# Patient Record
Sex: Female | Born: 1995 | Race: White | Hispanic: No | Marital: Married | Smoking: Former smoker
Health system: Southern US, Community
[De-identification: ages and names within clinical notes are randomized; demographics above are authoritative.]

## PROBLEM LIST (undated history)

## (undated) ENCOUNTER — Inpatient Hospital Stay: Payer: Self-pay

## (undated) DIAGNOSIS — F419 Anxiety disorder, unspecified: Secondary | ICD-10-CM

## (undated) DIAGNOSIS — F32A Depression, unspecified: Secondary | ICD-10-CM

## (undated) DIAGNOSIS — G4730 Sleep apnea, unspecified: Secondary | ICD-10-CM

## (undated) DIAGNOSIS — F329 Major depressive disorder, single episode, unspecified: Secondary | ICD-10-CM

## (undated) DIAGNOSIS — K219 Gastro-esophageal reflux disease without esophagitis: Secondary | ICD-10-CM

## (undated) HISTORY — PX: WISDOM TOOTH EXTRACTION: SHX21

## (undated) HISTORY — PX: FRACTURE SURGERY: SHX138

## (undated) HISTORY — DX: Anxiety disorder, unspecified: F41.9

## (undated) HISTORY — DX: Depression, unspecified: F32.A

## (undated) HISTORY — DX: Major depressive disorder, single episode, unspecified: F32.9

---

## 2014-06-01 ENCOUNTER — Ambulatory Visit: Payer: Self-pay | Admitting: Family Medicine

## 2015-03-26 NOTE — L&D Delivery Note (Signed)
Delivery Note At 7:54 AM a viable female infant delivered via Vaginal, Spontaneous Delivery (Presentation: LOA).  APGAR: 8, 9; weight 7 lb 0.2 oz (3180 g).   Placenta status: delivered, spontaneously and intact.  Cord: 3VC with the following complications: None.  Cord pH: n/a  Anesthesia:  epidural Episiotomy: None Lacerations: 2nd degree Suture Repair: 3.0 vicryl Est. Blood Loss (mL): 400  Mom to postpartum.  Baby to Couplet care / Skin to Skin.  Called to see patient.  Mom pushed to deliver a viable female infant.  The head followed by shoulders, which delivered without difficulty, and the rest of the body.  No nuchal cord noted.  Baby to mom's chest.  Cord clamped and cut after > 1 min delay.  No cord blood obtained.  Placenta delivered spontaneously, intact, with a 3-vessel cord.  Second degree perineal laceration repaired with 3-0 Vicryl in standard fashion.  All counts correct.  Hemostasis obtained with IV pitocin and fundal massage. EBL 400 mL.    [name], Natosha Bou D, MD 01/25/2016, 8:16 AM

## 2015-04-20 DIAGNOSIS — Z309 Encounter for contraceptive management, unspecified: Secondary | ICD-10-CM | POA: Diagnosis not present

## 2015-04-20 DIAGNOSIS — F419 Anxiety disorder, unspecified: Secondary | ICD-10-CM | POA: Diagnosis not present

## 2015-04-20 DIAGNOSIS — Z113 Encounter for screening for infections with a predominantly sexual mode of transmission: Secondary | ICD-10-CM | POA: Diagnosis not present

## 2015-04-20 DIAGNOSIS — F329 Major depressive disorder, single episode, unspecified: Secondary | ICD-10-CM | POA: Diagnosis not present

## 2015-06-06 DIAGNOSIS — O99211 Obesity complicating pregnancy, first trimester: Secondary | ICD-10-CM | POA: Diagnosis not present

## 2015-06-06 DIAGNOSIS — E669 Obesity, unspecified: Secondary | ICD-10-CM | POA: Diagnosis not present

## 2015-06-06 DIAGNOSIS — O0991 Supervision of high risk pregnancy, unspecified, first trimester: Secondary | ICD-10-CM | POA: Diagnosis not present

## 2015-06-06 DIAGNOSIS — Z6841 Body Mass Index (BMI) 40.0 and over, adult: Secondary | ICD-10-CM | POA: Diagnosis not present

## 2015-06-06 DIAGNOSIS — Z8619 Personal history of other infectious and parasitic diseases: Secondary | ICD-10-CM | POA: Diagnosis not present

## 2015-06-21 DIAGNOSIS — Z3A08 8 weeks gestation of pregnancy: Secondary | ICD-10-CM | POA: Diagnosis not present

## 2015-06-21 DIAGNOSIS — Z36 Encounter for antenatal screening of mother: Secondary | ICD-10-CM | POA: Diagnosis not present

## 2015-06-21 DIAGNOSIS — O99211 Obesity complicating pregnancy, first trimester: Secondary | ICD-10-CM | POA: Diagnosis not present

## 2015-06-21 DIAGNOSIS — Z6841 Body Mass Index (BMI) 40.0 and over, adult: Secondary | ICD-10-CM | POA: Diagnosis not present

## 2015-06-21 DIAGNOSIS — O0993 Supervision of high risk pregnancy, unspecified, third trimester: Secondary | ICD-10-CM | POA: Diagnosis not present

## 2015-06-21 DIAGNOSIS — E669 Obesity, unspecified: Secondary | ICD-10-CM | POA: Diagnosis not present

## 2015-06-21 DIAGNOSIS — Z8619 Personal history of other infectious and parasitic diseases: Secondary | ICD-10-CM | POA: Diagnosis not present

## 2015-06-21 DIAGNOSIS — O0991 Supervision of high risk pregnancy, unspecified, first trimester: Secondary | ICD-10-CM | POA: Diagnosis not present

## 2015-06-21 LAB — OB RESULTS CONSOLE ANTIBODY SCREEN: Antibody Screen: NEGATIVE

## 2015-06-21 LAB — OB RESULTS CONSOLE VARICELLA ZOSTER ANTIBODY, IGG: Varicella: IMMUNE

## 2015-06-21 LAB — OB RESULTS CONSOLE HIV ANTIBODY (ROUTINE TESTING): HIV: NONREACTIVE

## 2015-06-21 LAB — OB RESULTS CONSOLE ABO/RH: RH Type: POSITIVE

## 2015-06-21 LAB — OB RESULTS CONSOLE HEPATITIS B SURFACE ANTIGEN: Hepatitis B Surface Ag: NEGATIVE

## 2015-06-21 LAB — OB RESULTS CONSOLE RPR: RPR: NONREACTIVE

## 2015-06-21 LAB — OB RESULTS CONSOLE RUBELLA ANTIBODY, IGM: Rubella: IMMUNE

## 2015-06-28 DIAGNOSIS — J029 Acute pharyngitis, unspecified: Secondary | ICD-10-CM | POA: Diagnosis not present

## 2015-07-19 DIAGNOSIS — Z3A12 12 weeks gestation of pregnancy: Secondary | ICD-10-CM | POA: Diagnosis not present

## 2015-07-19 DIAGNOSIS — Z36 Encounter for antenatal screening of mother: Secondary | ICD-10-CM | POA: Diagnosis not present

## 2015-08-03 DIAGNOSIS — H52223 Regular astigmatism, bilateral: Secondary | ICD-10-CM | POA: Diagnosis not present

## 2015-08-16 DIAGNOSIS — Z3A16 16 weeks gestation of pregnancy: Secondary | ICD-10-CM | POA: Diagnosis not present

## 2015-08-16 DIAGNOSIS — O99212 Obesity complicating pregnancy, second trimester: Secondary | ICD-10-CM | POA: Diagnosis not present

## 2015-08-16 DIAGNOSIS — Z6841 Body Mass Index (BMI) 40.0 and over, adult: Secondary | ICD-10-CM | POA: Diagnosis not present

## 2015-09-06 DIAGNOSIS — Z6841 Body Mass Index (BMI) 40.0 and over, adult: Secondary | ICD-10-CM | POA: Diagnosis not present

## 2015-09-06 DIAGNOSIS — O99212 Obesity complicating pregnancy, second trimester: Secondary | ICD-10-CM | POA: Diagnosis not present

## 2015-09-06 DIAGNOSIS — Z3A19 19 weeks gestation of pregnancy: Secondary | ICD-10-CM | POA: Diagnosis not present

## 2015-09-06 DIAGNOSIS — Z36 Encounter for antenatal screening of mother: Secondary | ICD-10-CM | POA: Diagnosis not present

## 2015-11-01 DIAGNOSIS — Z131 Encounter for screening for diabetes mellitus: Secondary | ICD-10-CM | POA: Diagnosis not present

## 2015-11-01 DIAGNOSIS — Z3A27 27 weeks gestation of pregnancy: Secondary | ICD-10-CM | POA: Diagnosis not present

## 2015-11-01 DIAGNOSIS — Z6841 Body Mass Index (BMI) 40.0 and over, adult: Secondary | ICD-10-CM | POA: Diagnosis not present

## 2015-11-01 DIAGNOSIS — O0992 Supervision of high risk pregnancy, unspecified, second trimester: Secondary | ICD-10-CM | POA: Diagnosis not present

## 2015-11-01 DIAGNOSIS — O99212 Obesity complicating pregnancy, second trimester: Secondary | ICD-10-CM | POA: Diagnosis not present

## 2015-11-22 DIAGNOSIS — R7302 Impaired glucose tolerance (oral): Secondary | ICD-10-CM | POA: Diagnosis not present

## 2015-11-22 DIAGNOSIS — Z6841 Body Mass Index (BMI) 40.0 and over, adult: Secondary | ICD-10-CM | POA: Diagnosis not present

## 2015-11-22 DIAGNOSIS — Z131 Encounter for screening for diabetes mellitus: Secondary | ICD-10-CM | POA: Diagnosis not present

## 2015-11-22 DIAGNOSIS — O0992 Supervision of high risk pregnancy, unspecified, second trimester: Secondary | ICD-10-CM | POA: Diagnosis not present

## 2015-11-22 DIAGNOSIS — Z3A27 27 weeks gestation of pregnancy: Secondary | ICD-10-CM | POA: Diagnosis not present

## 2015-11-22 DIAGNOSIS — O99212 Obesity complicating pregnancy, second trimester: Secondary | ICD-10-CM | POA: Diagnosis not present

## 2015-11-26 ENCOUNTER — Encounter: Payer: Self-pay | Admitting: Emergency Medicine

## 2015-11-26 DIAGNOSIS — O26892 Other specified pregnancy related conditions, second trimester: Secondary | ICD-10-CM | POA: Diagnosis not present

## 2015-11-26 DIAGNOSIS — Z3A28 28 weeks gestation of pregnancy: Secondary | ICD-10-CM | POA: Insufficient documentation

## 2015-11-26 DIAGNOSIS — O9989 Other specified diseases and conditions complicating pregnancy, childbirth and the puerperium: Secondary | ICD-10-CM | POA: Diagnosis not present

## 2015-11-26 DIAGNOSIS — R0781 Pleurodynia: Secondary | ICD-10-CM | POA: Diagnosis not present

## 2015-11-26 DIAGNOSIS — R079 Chest pain, unspecified: Secondary | ICD-10-CM | POA: Diagnosis not present

## 2015-11-26 DIAGNOSIS — Z3A01 Less than 8 weeks gestation of pregnancy: Secondary | ICD-10-CM | POA: Diagnosis not present

## 2015-11-26 DIAGNOSIS — Z87891 Personal history of nicotine dependence: Secondary | ICD-10-CM | POA: Diagnosis not present

## 2015-11-26 DIAGNOSIS — R0602 Shortness of breath: Secondary | ICD-10-CM | POA: Diagnosis present

## 2015-11-26 LAB — COMPREHENSIVE METABOLIC PANEL
ALT: 11 U/L — ABNORMAL LOW (ref 14–54)
AST: 19 U/L (ref 15–41)
Albumin: 3.3 g/dL — ABNORMAL LOW (ref 3.5–5.0)
Alkaline Phosphatase: 72 U/L (ref 38–126)
Anion gap: 10 (ref 5–15)
BUN: 6 mg/dL (ref 6–20)
CO2: 17 mmol/L — ABNORMAL LOW (ref 22–32)
Calcium: 8.6 mg/dL — ABNORMAL LOW (ref 8.9–10.3)
Chloride: 106 mmol/L (ref 101–111)
Creatinine, Ser: 0.37 mg/dL — ABNORMAL LOW (ref 0.44–1.00)
GFR calc Af Amer: >60 mL/min (ref >60)
GFR calc non Af Amer: >60 mL/min (ref >60)
Glucose, Bld: 79 mg/dL (ref 65–99)
Potassium: 3.7 mmol/L (ref 3.5–5.1)
Sodium: 133 mmol/L — ABNORMAL LOW (ref 135–145)
Total Bilirubin: 0.2 mg/dL — ABNORMAL LOW (ref 0.3–1.2)
Total Protein: 7 g/dL (ref 6.5–8.1)

## 2015-11-26 LAB — CBC WITH DIFFERENTIAL/PLATELET
Basophils Absolute: 0 10*3/uL (ref 0–0.1)
Basophils Relative: 0 %
Eosinophils Absolute: 0.1 10*3/uL (ref 0–0.7)
Eosinophils Relative: 1 %
HCT: 35.1 % (ref 35.0–47.0)
Hemoglobin: 12 g/dL (ref 12.0–16.0)
Lymphocytes Relative: 17 %
Lymphs Abs: 2.7 10*3/uL (ref 1.0–3.6)
MCH: 29.9 pg (ref 26.0–34.0)
MCHC: 34.3 g/dL (ref 32.0–36.0)
MCV: 87.2 fL (ref 80.0–100.0)
Monocytes Absolute: 1.3 10*3/uL — ABNORMAL HIGH (ref 0.2–0.9)
Monocytes Relative: 8 %
Neutro Abs: 11.7 10*3/uL — ABNORMAL HIGH (ref 1.4–6.5)
Neutrophils Relative %: 74 %
Platelets: 316 10*3/uL (ref 150–440)
RBC: 4.02 MIL/uL (ref 3.80–5.20)
RDW: 12.6 % (ref 11.5–14.5)
WBC: 15.7 10*3/uL — ABNORMAL HIGH (ref 3.6–11.0)

## 2015-11-26 LAB — FIBRIN DERIVATIVES D-DIMER (ARMC ONLY): Fibrin derivatives D-dimer (ARMC): 969 — ABNORMAL HIGH (ref 0–499)

## 2015-11-26 LAB — TROPONIN I: Troponin I: <0.03 ng/mL (ref <0.03)

## 2015-11-26 NOTE — ED Notes (Signed)
Pt c/o right upper quadrant pain, sharp with a deep breath; pt is 7 months pregnant; ambulatory with steady gait; talking in complete coherent sentences

## 2015-11-26 NOTE — ED Triage Notes (Signed)
Pt presents to ED with "right sided rib cage pain" that worsens when taking a deep breath in. Pt states she called her mom who encouraged her to come get checked out to ensure she hasn't developed a blood clot. Pain started approx 3 hours ago while she was lying down. No hx of similar pain. Denies pregnancy complications. Denies n/v/d. Alert and calm at this time with no increased work of breathing or acute distress noted.

## 2015-11-27 ENCOUNTER — Emergency Department
Admission: EM | Admit: 2015-11-27 | Discharge: 2015-11-27 | Disposition: A | Discharge status: Home / Self Care | Payer: 59 | Attending: Emergency Medicine | Admitting: Emergency Medicine

## 2015-11-27 ENCOUNTER — Emergency Department: Payer: 59

## 2015-11-27 DIAGNOSIS — R079 Chest pain, unspecified: Secondary | ICD-10-CM

## 2015-11-27 DIAGNOSIS — Z3A28 28 weeks gestation of pregnancy: Secondary | ICD-10-CM | POA: Diagnosis not present

## 2015-11-27 DIAGNOSIS — R072 Precordial pain: Secondary | ICD-10-CM | POA: Diagnosis not present

## 2015-11-27 DIAGNOSIS — Z87891 Personal history of nicotine dependence: Secondary | ICD-10-CM | POA: Diagnosis not present

## 2015-11-27 DIAGNOSIS — O26892 Other specified pregnancy related conditions, second trimester: Secondary | ICD-10-CM | POA: Diagnosis not present

## 2015-11-27 MED ORDER — ACETAMINOPHEN 325 MG PO TABS: 650.0000 mg | ORAL_TABLET | Freq: Once | ORAL | Status: DC

## 2015-11-27 MED ORDER — IOPAMIDOL (ISOVUE-370) INJECTION 76%
100.0000 mL | Freq: Once | INTRAVENOUS | Status: AC | PRN
  Administered 2015-11-27: 100 mL via INTRAVENOUS

## 2015-11-27 NOTE — ED Provider Notes (Signed)
[facility] Emergency Department Provider Note   ____________________________________________   First MD Initiated Contact with Patient 11/27/15 [phone]     (approximate)  I have reviewed the triage vital signs and the nursing notes.   HISTORY  Chief Complaint Abdominal Pain    HPI Brittney Gill is a 20 y.o. female who comes into the hospital today with pain in her rib cage. She reports it started between 6:30 and 7 PM. The patient is 7 months pregnant and feels like she can't take a deep breath. She reports that she's not struggling for air when she feels mildly short of breath. She reports that the pain is a 4-5 out of 10 in intensity but it worse when she takes a deep breath in. The patient called her mother who is a Engineer, civil (consulting) and was told to come into the hospital to make sure she doesn't have a clot in her lung. She reports that she's never had it before. She has a mild headache with no dizziness and no passing out. The patient reports that the pain doesn't radiate and she's had no sweats, nausea or vomiting. She had some abdominal pain earlier in the weekend but reports that that has resolved. The patient denies any vaginal bleeding. She is here for evaluation.The patient also endorses some pain in her calf when she flexes her foot.   History reviewed. No pertinent past medical history.  There are no active problems to display for this patient.   Past Surgical History:  Procedure Laterality Date  . WISDOM TOOTH EXTRACTION      Prior to Admission medications   Not on File    Allergies Diflucan [fluconazole]  No family history on file.  Social History Social History  Substance Use Topics  . Smoking status: Former Smoker    Types: Cigarettes    Quit date: 04/26/2015  . Smokeless tobacco: Not on file  . Alcohol use No    Review of Systems Constitutional: No fever/chills Eyes: No visual changes. ENT: No sore throat. Cardiovascular:  chest  pain. Respiratory:  shortness of breath. Gastrointestinal:  abdominal pain.  No nausea, no vomiting.  No diarrhea.  No constipation. Genitourinary: Negative for dysuria. Musculoskeletal: Negative for back pain. Skin: Negative for rash. Neurological: Headache  10-point ROS otherwise negative.  ____________________________________________   PHYSICAL EXAM:  VITAL SIGNS: ED Triage Vitals  Enc Vitals Group     BP 11/26/15 2102 (!) 147/78     Pulse Rate 11/26/15 2102 94     Resp 11/26/15 2102 18     Temp 11/26/15 2102 98.4 F (36.9 C)     Temp Source 11/26/15 2102 Oral     SpO2 11/26/15 2102 100 %     Weight 11/26/15 2102 250 lb (113.4 kg)     Height 11/26/15 2102 5\' 6"  (1.676 m)     Head Circumference --      Peak Flow --      Pain Score 11/26/15 2105 4     Pain Loc --      Pain Edu? --      Excl. in GC? --     Constitutional: Alert and oriented. Well appearing and in Mild distress. Eyes: Conjunctivae are normal. PERRL. EOMI. Head: Atraumatic. Nose: No congestion/rhinnorhea. Mouth/Throat: Mucous membranes are moist.  Oropharynx non-erythematous. Cardiovascular: Normal rate, regular rhythm. Grossly normal heart sounds.  Good peripheral circulation. Respiratory: Normal respiratory effort.  No retractions. Lungs CTAB. Gastrointestinal: Soft and nontender. No distention. Positive  bowel sounds Musculoskeletal: No lower extremity tenderness nor edema.   Neurologic:  Normal speech and language.  Skin:  Skin is warm, dry and intact.  Psychiatric: Mood and affect are normal.   ____________________________________________   LABS (all labs ordered are listed, but only abnormal results are displayed)  Labs Reviewed  COMPREHENSIVE METABOLIC PANEL - Abnormal; Notable for the following:       Result Value   Sodium 133 (*)    CO2 17 (*)    Creatinine, Ser 0.37 (*)    Calcium 8.6 (*)    Albumin 3.3 (*)    ALT 11 (*)    Total Bilirubin 0.2 (*)    All other components within  normal limits  CBC WITH DIFFERENTIAL/PLATELET - Abnormal; Notable for the following:    WBC 15.7 (*)    Neutro Abs 11.7 (*)    Monocytes Absolute 1.3 (*)    All other components within normal limits  FIBRIN DERIVATIVES D-DIMER ([facility] ONLY) - Abnormal; Notable for the following:    Fibrin derivatives D-dimer ([facility]) 969 (*)    All other components within normal limits  TROPONIN I   ____________________________________________  EKG  ED ECG REPORT I, Rebecka ApleyWebster,  Keenya Matera P, the attending physician, personally viewed and interpreted this ECG.   Date: 11/27/2015  EKG Time: 203  Rate: 85  Rhythm: normal sinus rhythm  Axis: normal  Intervals:none  ST&T Change: none  ____________________________________________  RADIOLOGY  CT angio chest ____________________________________________   PROCEDURES  Procedure(s) performed: None  Procedures  Critical Care performed: No  ____________________________________________   INITIAL IMPRESSION / ASSESSMENT AND PLAN / ED COURSE  Pertinent labs & imaging results that were available during my care of the patient were reviewed by me and considered in my medical decision making (see chart for details).  This is a 20 year old female who comes into the hospital today with some pain with deep inspiration to her right chest. The patient is 7 months pregnant. I did discuss the CT scan with the patient as a way to discover if she has clot in her chest. I discussed the risks of radiation to the patient and she reports that she understands and is willing to take the risk. The patient receive a CTA of her chest for evaluation of a clot.  Clinical Course  Value Comment By Time  CT Angio Chest PE W and/or Wo Contrast No acute intrathoracic pathology identified. No definite CT evidence of central pulmonary artery embolus   Rebecka ApleyAllison P Thaddius Manes, MD 09/04 0246   The patient's CT scan is unremarkable. I did give the patient a dose of Tylenol. The patient's  blood work shows an elevated d-dimer as well as a white count of 15 which can be elevated due to the pregnancy and a bicarbonate of 17. She will be encouraged to increase her oral intake. She is in no acute distress at this time. She will be discharged home to follow-up with her OB/GYN.  ____________________________________________   FINAL CLINICAL IMPRESSION(S) / ED DIAGNOSES  Final diagnoses:  Chest pain, unspecified chest pain type      NEW MEDICATIONS STARTED DURING THIS VISIT:  New Prescriptions   No medications on file     Note:  This document was prepared using Dragon voice recognition software and may include unintentional dictation errors.    Rebecka ApleyAllison P Rasean Joos, MD 11/27/15 [phone]

## 2015-11-29 DIAGNOSIS — Z3A31 31 weeks gestation of pregnancy: Secondary | ICD-10-CM | POA: Diagnosis not present

## 2015-11-29 DIAGNOSIS — Z23 Encounter for immunization: Secondary | ICD-10-CM | POA: Diagnosis not present

## 2015-12-15 DIAGNOSIS — J029 Acute pharyngitis, unspecified: Secondary | ICD-10-CM | POA: Diagnosis not present

## 2016-01-03 DIAGNOSIS — O0993 Supervision of high risk pregnancy, unspecified, third trimester: Secondary | ICD-10-CM | POA: Diagnosis not present

## 2016-01-03 DIAGNOSIS — Z6841 Body Mass Index (BMI) 40.0 and over, adult: Secondary | ICD-10-CM | POA: Diagnosis not present

## 2016-01-03 DIAGNOSIS — Z113 Encounter for screening for infections with a predominantly sexual mode of transmission: Secondary | ICD-10-CM | POA: Diagnosis not present

## 2016-01-03 DIAGNOSIS — Z3A36 36 weeks gestation of pregnancy: Secondary | ICD-10-CM | POA: Diagnosis not present

## 2016-01-03 LAB — OB RESULTS CONSOLE GBS: GBS: NEGATIVE

## 2016-01-10 DIAGNOSIS — Z369 Encounter for antenatal screening, unspecified: Secondary | ICD-10-CM | POA: Diagnosis not present

## 2016-01-10 DIAGNOSIS — Z3A37 37 weeks gestation of pregnancy: Secondary | ICD-10-CM | POA: Diagnosis not present

## 2016-01-10 DIAGNOSIS — Z6841 Body Mass Index (BMI) 40.0 and over, adult: Secondary | ICD-10-CM | POA: Diagnosis not present

## 2016-01-10 DIAGNOSIS — O99213 Obesity complicating pregnancy, third trimester: Secondary | ICD-10-CM | POA: Diagnosis not present

## 2016-01-10 DIAGNOSIS — E669 Obesity, unspecified: Secondary | ICD-10-CM | POA: Diagnosis not present

## 2016-01-17 DIAGNOSIS — Z6841 Body Mass Index (BMI) 40.0 and over, adult: Secondary | ICD-10-CM | POA: Diagnosis not present

## 2016-01-17 DIAGNOSIS — Z369 Encounter for antenatal screening, unspecified: Secondary | ICD-10-CM | POA: Diagnosis not present

## 2016-01-17 DIAGNOSIS — O99213 Obesity complicating pregnancy, third trimester: Secondary | ICD-10-CM | POA: Diagnosis not present

## 2016-01-17 DIAGNOSIS — E669 Obesity, unspecified: Secondary | ICD-10-CM | POA: Diagnosis not present

## 2016-01-17 DIAGNOSIS — Z3A38 38 weeks gestation of pregnancy: Secondary | ICD-10-CM | POA: Diagnosis not present

## 2016-01-22 ENCOUNTER — Observation Stay
Admission: EM | Admit: 2016-01-22 | Discharge: 2016-01-22 | Disposition: A | Discharge status: Home / Self Care | Payer: 59 | Source: Home / Self Care | Admitting: Obstetrics and Gynecology

## 2016-01-22 DIAGNOSIS — Z888 Allergy status to other drugs, medicaments and biological substances status: Secondary | ICD-10-CM | POA: Insufficient documentation

## 2016-01-22 DIAGNOSIS — O133 Gestational [pregnancy-induced] hypertension without significant proteinuria, third trimester: Secondary | ICD-10-CM | POA: Insufficient documentation

## 2016-01-22 DIAGNOSIS — Z87891 Personal history of nicotine dependence: Secondary | ICD-10-CM

## 2016-01-22 DIAGNOSIS — R030 Elevated blood-pressure reading, without diagnosis of hypertension: Secondary | ICD-10-CM | POA: Diagnosis not present

## 2016-01-22 DIAGNOSIS — Z3A38 38 weeks gestation of pregnancy: Secondary | ICD-10-CM

## 2016-01-22 LAB — COMPREHENSIVE METABOLIC PANEL
ALT: 13 U/L — ABNORMAL LOW (ref 14–54)
AST: 21 U/L (ref 15–41)
Albumin: 3.3 g/dL — ABNORMAL LOW (ref 3.5–5.0)
Alkaline Phosphatase: 102 U/L (ref 38–126)
Anion gap: 10 (ref 5–15)
BUN: 9 mg/dL (ref 6–20)
CO2: 20 mmol/L — ABNORMAL LOW (ref 22–32)
Calcium: 8.9 mg/dL (ref 8.9–10.3)
Chloride: 102 mmol/L (ref 101–111)
Creatinine, Ser: 0.55 mg/dL (ref 0.44–1.00)
GFR calc Af Amer: >60 mL/min (ref >60)
GFR calc non Af Amer: >60 mL/min (ref >60)
Glucose, Bld: 74 mg/dL (ref 65–99)
Potassium: 3.6 mmol/L (ref 3.5–5.1)
Sodium: 132 mmol/L — ABNORMAL LOW (ref 135–145)
Total Bilirubin: <0.1 mg/dL — ABNORMAL LOW (ref 0.3–1.2)
Total Protein: 7 g/dL (ref 6.5–8.1)

## 2016-01-22 LAB — CBC
HCT: 34.8 % — ABNORMAL LOW (ref 35.0–47.0)
Hemoglobin: 11.7 g/dL — ABNORMAL LOW (ref 12.0–16.0)
MCH: 28.9 pg (ref 26.0–34.0)
MCHC: 33.7 g/dL (ref 32.0–36.0)
MCV: 86 fL (ref 80.0–100.0)
Platelets: 265 10*3/uL (ref 150–440)
RBC: 4.05 MIL/uL (ref 3.80–5.20)
RDW: 13.3 % (ref 11.5–14.5)
WBC: 12.5 10*3/uL — ABNORMAL HIGH (ref 3.6–11.0)

## 2016-01-22 LAB — PROTEIN / CREATININE RATIO, URINE
Creatinine, Urine: 195 mg/dL
Protein Creatinine Ratio: 0.19 mg/mg{Cre} — ABNORMAL HIGH (ref 0.00–0.15)
Total Protein, Urine: 38 mg/dL

## 2016-01-22 NOTE — OB Triage Note (Signed)
Ms. Brittney JeffersonGood here with c/o LOF at approx 1500, unsure of amt and color. States did not smell like urine. Denies bleeding, ctx, reports + FM

## 2016-01-22 NOTE — Discharge Instructions (Signed)
Come back if:  Big gush of fluids Temp over 100.4 Decreased fetal movement Heavy vaginal bleeding Contractions every 3-5 min apart for at least one hour  Get plenty of rest and stay well hydrated!

## 2016-01-22 NOTE — Discharge Summary (Signed)
Physician Final Progress Note  Patient ID: Brittney Gill MRN: [id number] DOB/AGE: 21-13-1997 21 y.o.  Admit date: 01/22/2016 Admitting provider: Rod Gill, CNM Discharge date: 01/22/2016   Admission Diagnoses: G1P0 at 54w5dwith c/o LOF earlier today. She admits positive fetal movement. She denies contractions or vaginal bleeding.   Discharge Diagnoses:  Active Problems:   Labor and delivery indication for care or intervention   Gestational hypertension without significant proteinuria during pregnancy in third trimester, antepartum  IUP with reactive NST, membranes intact  Hospital course: Pt was admitted for observation, placed on monitors, labs done.  No past medical history on file.  Past Surgical History:  Procedure Laterality Date  . WISDOM TOOTH EXTRACTION      No current facility-administered medications on file prior to encounter.    No current outpatient prescriptions on file prior to encounter.    Allergies  Allergen Reactions  . Diflucan [Fluconazole] Rash    Social History   Social History  . Marital status: Single    Spouse name: N/A  . Number of children: N/A  . Years of education: N/A   Occupational History  . Not on file.   Social History Main Topics  . Smoking status: Former Smoker    Types: Cigarettes    Quit date: 04/26/2015  . Smokeless tobacco: Not on file  . Alcohol use No  . Drug use: No  . Sexual activity: Not on file   Other Topics Concern  . Not on file   Social History Narrative  . No narrative on file    Physical Exam: BP (!) 146/84 (BP Location: Left Arm)   Pulse 75   Temp 98.9 F (37.2 C) (Oral)   Resp 16   Gen: NAD CV: RRR Pulm: CTAB Pelvic: deferred Ext: no evidence of DVT Toco: negative Fetal Well Being: 135 bpm, moderate variability, positive accelerations, negative decelerations ROM assessment: Nitrazine negative  Consults: None  Significant Findings/ Diagnostic Studies: labs:   Results for Brittney Gill(MRN [id number] as of 01/22/2016 20:19  Ref. Range 01/22/2016 18:00 01/22/2016 18:17  Sodium Latest Ref Range: 135 - 145 mmol/L  132 (L)  Potassium Latest Ref Range: 3.5 - 5.1 mmol/L  3.6  Chloride Latest Ref Range: 101 - 111 mmol/L  102  CO2 Latest Ref Range: 22 - 32 mmol/L  20 (L)  BUN Latest Ref Range: 6 - 20 mg/dL  9  Creatinine Latest Ref Range: 0.44 - 1.00 mg/dL  0.55  Calcium Latest Ref Range: 8.9 - 10.3 mg/dL  8.9  EGFR (Non-African Amer.) Latest Ref Range: >60 mL/min  >60  EGFR (African American) Latest Ref Range: >60 mL/min  >60  Glucose Latest Ref Range: 65 - 99 mg/dL  74  Anion gap Latest Ref Range: 5 - 15   10  Alkaline Phosphatase Latest Ref Range: 38 - 126 U/L  102  Albumin Latest Ref Range: 3.5 - 5.0 g/dL  3.3 (L)  AST Latest Ref Range: 15 - 41 U/L  21  ALT Latest Ref Range: 14 - 54 U/L  13 (L)  Total Protein Latest Ref Range: 6.5 - 8.1 g/dL  7.0  Total Bilirubin Latest Ref Range: 0.3 - 1.2 mg/dL  <0.1 (L)  WBC Latest Ref Range: 3.6 - 11.0 K/uL  12.5 (H)  RBC Latest Ref Range: 3.80 - 5.20 MIL/uL  4.05  Hemoglobin Latest Ref Range: 12.0 - 16.0 g/dL  11.7 (L)  HCT Latest Ref Range: 35.0 - 47.0 %  34.8 (  L)  MCV Latest Ref Range: 80.0 - 100.0 fL  86.0  MCH Latest Ref Range: 26.0 - 34.0 pg  28.9  MCHC Latest Ref Range: 32.0 - 36.0 g/dL  33.7  RDW Latest Ref Range: 11.5 - 14.5 %  13.3  Platelets Latest Ref Range: 150 - 440 K/uL  265  Total Protein, Urine Latest Units: mg/dL 38   Protein Creatinine Ratio Latest Ref Range: 0.00 - 0.15 mg/mgCre 0.19 (H)   Creatinine, Urine Latest Units: mg/dL 195     Procedures: NST  Discharge Condition: good  Disposition: 01-Home or Self Care  Diet: Regular diet  Discharge Activity: Activity as tolerated  Discussed plan of care with patient- given her elevated blood pressure and being greater than [redacted] weeks gestation, a recommendation is made to stay at hospital to begin induction. She is also given the choice of going home  tonight and returning to clinic tomorrow for a blood pressure check with the understanding that if her pressure is still elevated, the recommendation will be to induce at that time. The patient, along with her mother, has decided to go home tonight and return for BP check in clinic tomorrow.   Discharge Instructions    Discharge activity:  No Restrictions    Complete by:  As directed    Discharge diet:  No restrictions    Complete by:  As directed    Fetal Kick Count:  Lie on our left side for one hour after a meal, and count the number of times your baby kicks.  If it is less than 5 times, get up, move around and drink some juice.  Repeat the test 30 minutes later.  If it is still less than 5 kicks in an hour, notify your doctor.    Complete by:  As directed    LABOR:  When conractions begin, you should start to time them from the beginning of one contraction to the beginning  of the next.  When contractions are 5 - 10 minutes apart or less and have been regular for at least an hour, you should call your health care provider.    Complete by:  As directed    No sexual activity restrictions    Complete by:  As directed    Notify physician for bleeding from the vagina    Complete by:  As directed    Notify physician for blurring of vision or spots before the eyes    Complete by:  As directed    Notify physician for chills or fever    Complete by:  As directed    Notify physician for fainting spells, "black outs" or loss of consciousness    Complete by:  As directed    Notify physician for increase in vaginal discharge    Complete by:  As directed    Notify physician for leaking of fluid    Complete by:  As directed    Notify physician for pain or burning when urinating    Complete by:  As directed    Notify physician for pelvic pressure (sudden increase)    Complete by:  As directed    Notify physician for severe or continued nausea or vomiting    Complete by:  As directed    Notify  physician for sudden gushing of fluid from the vagina (with or without continued leaking)    Complete by:  As directed    Notify physician for sudden, constant, or occasional abdominal pain  Complete by:  As directed    Notify physician if baby moving less than usual    Complete by:  As directed        Medication List    You have not been prescribed any medications.    Follow-up Information    [facility], [place] .   Why:  go to regular scheduled prenatal appointment/BP check Contact information: [address] [place] [address] [phone]      Total time spent taking care of this patient: 30 minutes  Signed: Rod Gill, CNM  01/22/2016, 8:13 PM

## 2016-01-23 DIAGNOSIS — R030 Elevated blood-pressure reading, without diagnosis of hypertension: Secondary | ICD-10-CM | POA: Diagnosis not present

## 2016-01-24 ENCOUNTER — Encounter: Payer: Self-pay | Admitting: *Deleted

## 2016-01-24 ENCOUNTER — Inpatient Hospital Stay
Admission: RE | Admit: 2016-01-24 | Discharge: 2016-01-26 | DRG: 775 | Disposition: A | Discharge status: Home / Self Care | Payer: 59 | Attending: Obstetrics and Gynecology | Admitting: Obstetrics and Gynecology

## 2016-01-24 DIAGNOSIS — Z87891 Personal history of nicotine dependence: Secondary | ICD-10-CM | POA: Diagnosis not present

## 2016-01-24 DIAGNOSIS — O134 Gestational [pregnancy-induced] hypertension without significant proteinuria, complicating childbirth: Secondary | ICD-10-CM | POA: Diagnosis present

## 2016-01-24 DIAGNOSIS — O99213 Obesity complicating pregnancy, third trimester: Secondary | ICD-10-CM | POA: Diagnosis not present

## 2016-01-24 DIAGNOSIS — K219 Gastro-esophageal reflux disease without esophagitis: Secondary | ICD-10-CM | POA: Diagnosis present

## 2016-01-24 DIAGNOSIS — O99214 Obesity complicating childbirth: Secondary | ICD-10-CM | POA: Diagnosis present

## 2016-01-24 DIAGNOSIS — Z3A39 39 weeks gestation of pregnancy: Secondary | ICD-10-CM | POA: Diagnosis not present

## 2016-01-24 DIAGNOSIS — O9962 Diseases of the digestive system complicating childbirth: Secondary | ICD-10-CM | POA: Diagnosis present

## 2016-01-24 DIAGNOSIS — Z362 Encounter for other antenatal screening follow-up: Secondary | ICD-10-CM | POA: Diagnosis not present

## 2016-01-24 DIAGNOSIS — E669 Obesity, unspecified: Secondary | ICD-10-CM | POA: Diagnosis not present

## 2016-01-24 DIAGNOSIS — Z6841 Body Mass Index (BMI) 40.0 and over, adult: Secondary | ICD-10-CM

## 2016-01-24 DIAGNOSIS — O4103X0 Oligohydramnios, third trimester, not applicable or unspecified: Secondary | ICD-10-CM | POA: Diagnosis not present

## 2016-01-24 LAB — CHLAMYDIA/NGC RT PCR (ARMC ONLY)
Chlamydia Tr: NOT DETECTED
N gonorrhoeae: NOT DETECTED

## 2016-01-24 LAB — CBC
HCT: 36.4 % (ref 35.0–47.0)
Hemoglobin: 11.9 g/dL — ABNORMAL LOW (ref 12.0–16.0)
MCH: 28.3 pg (ref 26.0–34.0)
MCHC: 32.8 g/dL (ref 32.0–36.0)
MCV: 86.4 fL (ref 80.0–100.0)
Platelets: 263 10*3/uL (ref 150–440)
RBC: 4.22 MIL/uL (ref 3.80–5.20)
RDW: 13.4 % (ref 11.5–14.5)
WBC: 12.4 10*3/uL — ABNORMAL HIGH (ref 3.6–11.0)

## 2016-01-24 LAB — TYPE AND SCREEN
ABO/RH(D): B POS
Antibody Screen: NEGATIVE

## 2016-01-24 MED ORDER — ACETAMINOPHEN 325 MG PO TABS
650.0000 mg | ORAL_TABLET | ORAL | Status: DC | PRN
  Filled 2016-01-24: qty 2

## 2016-01-24 MED ORDER — LACTATED RINGERS IV SOLN: 500.0000 mL | INTRAVENOUS | Status: DC | PRN

## 2016-01-24 MED ORDER — FENTANYL 2.5 MCG/ML W/ROPIVACAINE 0.2% IN NS 100 ML EPIDURAL INFUSION (ARMC-ANES)
EPIDURAL | Status: AC
  Filled 2016-01-24: qty 100

## 2016-01-24 MED ORDER — BUTORPHANOL TARTRATE 1 MG/ML IJ SOLN
1.0000 mg | INTRAMUSCULAR | Status: DC | PRN
  Administered 2016-01-24: 1 mg via INTRAVENOUS
  Filled 2016-01-24: qty 1

## 2016-01-24 MED ORDER — OXYTOCIN 10 UNIT/ML IJ SOLN
INTRAMUSCULAR | Status: AC
  Filled 2016-01-24: qty 2

## 2016-01-24 MED ORDER — LIDOCAINE HCL (PF) 1 % IJ SOLN
INTRAMUSCULAR | Status: AC
  Filled 2016-01-24: qty 30

## 2016-01-24 MED ORDER — OXYTOCIN 40 UNITS IN LACTATED RINGERS INFUSION - SIMPLE MED
1.0000 m[IU]/min | INTRAVENOUS | Status: DC
  Administered 2016-01-24: 1 m[IU]/min via INTRAVENOUS

## 2016-01-24 MED ORDER — OXYTOCIN 40 UNITS IN LACTATED RINGERS INFUSION - SIMPLE MED
2.5000 [IU]/h | INTRAVENOUS | Status: DC
  Administered 2016-01-25: 2.5 [IU]/h via INTRAVENOUS

## 2016-01-24 MED ORDER — OXYTOCIN BOLUS FROM INFUSION
500.0000 mL | Freq: Once | INTRAVENOUS | Status: AC
  Administered 2016-01-25: 500 mL via INTRAVENOUS

## 2016-01-24 MED ORDER — MISOPROSTOL 200 MCG PO TABS
ORAL_TABLET | ORAL | Status: AC
  Filled 2016-01-24: qty 4

## 2016-01-24 MED ORDER — OXYTOCIN 40 UNITS IN LACTATED RINGERS INFUSION - SIMPLE MED
INTRAVENOUS | Status: AC
  Administered 2016-01-24: 1 m[IU]/min via INTRAVENOUS
  Filled 2016-01-24: qty 1000

## 2016-01-24 MED ORDER — TERBUTALINE SULFATE 1 MG/ML IJ SOLN
0.2500 mg | Freq: Once | INTRAMUSCULAR | Status: DC | PRN
  Filled 2016-01-24: qty 1

## 2016-01-24 MED ORDER — LACTATED RINGERS IV SOLN
INTRAVENOUS | Status: DC
  Administered 2016-01-24: 12:00:00 via INTRAVENOUS

## 2016-01-24 MED ORDER — AMMONIA AROMATIC IN INHA
RESPIRATORY_TRACT | Status: AC
  Filled 2016-01-24: qty 10

## 2016-01-24 NOTE — H&P (Signed)
Obstetric History and Physical  Brittney Gill is a 20 y.o. G1P0 with Estimated Date of Delivery: 01/31/16 per 8 wk US who presents at 2748w0d  presenting for IOL for Oligohydramnios. AFI in office today of 2 cm.  Patient states she has been having no contractions, no vaginal bleeding, intact membranes, with active fetal movement.    Prenatal Course Source of Care: [facility]  with onset of care at 6 weeks Pregnancy complications or risks: -Obesity, BMI > 40 - most recent growth at 55% -Elevated 1 hr at 28 wks with normal 3 hour -Enlarged fetal stomach on US - will notify Peds  Patient Active Problem List   Diagnosis Date Noted  . Labor and delivery indication for care or intervention 01/22/2016  . Gestational hypertension without significant proteinuria during pregnancy in third trimester, antepartum 01/22/2016   She plans to breastfeed She desires IUD possibly for postpartum contraception.   Prenatal labs and studies: ABO, Rh: B+  Antibody: Neg Rubella: Immune Varicella: Immune RPR:  NR HBsAg:  Neg HIV: Neg GC/CT: Neg/Neg GBS: Neg 1 hr Glucola: early 1 hr 87, 28 wk: 140, normal 3 hr   Genetic screening: Neg CF 32, 1st screen, MSAFP  TDAP: UTD 9/6 Flu: UTD 10/11   Prenatal Transfer Tool   Past Medical History:  Diagnosis Date  . Medical history non-contributory     Past Surgical History:  Procedure Laterality Date  . FRACTURE SURGERY     Left  . WISDOM TOOTH EXTRACTION      OB History  Gravida Para Term Preterm AB Living  1            SAB TAB Ectopic Multiple Live Births               # Outcome Date GA Lbr Len/2nd Weight Sex Delivery Anes PTL Lv  1 Current               Social History   Social History  . Marital status: Single    Spouse name: N/A  . Number of children: N/A  . Years of education: N/A   Social History Main Topics  . Smoking status: Former Smoker    Types: Cigarettes    Quit date: 04/26/2015  . Smokeless tobacco: Never Used  . Alcohol use No   . Drug use: No  . Sexual activity: No   Other Topics Concern  . None   Social History Narrative  . None    History reviewed. No pertinent family history.  Prescriptions Prior to Admission  Medication Sig Dispense Refill Last Dose  . Prenatal Vit-Fe Fumarate-FA (MULTIVITAMIN-PRENATAL) 27-0.8 MG TABS tablet Take 1 tablet by mouth daily at 12 noon.   01/24/2016 at Unknown time  . ranitidine (ZANTAC) 150 MG tablet Take 150 mg by mouth 1 day or 1 dose.   01/24/2016 at Unknown time  . vitamin C (ASCORBIC ACID) 500 MG tablet Take 500 mg by mouth daily.   01/24/2016 at Unknown time    Allergies  Allergen Reactions  . Diflucan [Fluconazole] Rash    Review of Systems: Negative except for what is mentioned in HPI.  Physical Exam: BP 136/71   Pulse 88   Temp 98.5 F (36.9 C) (Oral)   Resp 18   Ht 5\' 5"  (1.651 m)   Wt 272 lb (123.4 kg)   BMI 45.26 kg/m  GENERAL: Well-developed, well-nourished female in no acute distress.  LUNGS: Clear to auscultation bilaterally.  HEART: Regular rate and rhythm.  ABDOMEN: Soft, nontender, nondistended, gravid. EXTREMITIES: Nontender, no edema Cervical Exam: Dilatation 3 cm   Effacement 80 %   Station -2 per Dr Tiburcio PeaHarris at office   Presentation: cephalic FHT: Category: 1 Baseline rate 135-140 bpm   Variability moderate  Accelerations present   Decelerations none Contractions: irregular   Pertinent Labs/Studies:   No results found for this or any previous visit (from the past 24 hour(s)).  Assessment : IUP at 4617w0d, IOL for Olighydramnios  Plan: IOL with Pitocin - Reviewed risks, benefits of induction and Gill indication with Oligo. Pt in agreement with plan.

## 2016-01-24 NOTE — Anesthesia Preprocedure Evaluation (Signed)
Anesthesia Evaluation  Patient identified by MRN, date of birth, ID band Patient awake    Reviewed: Allergy & Precautions, H&P , NPO status , Patient's Chart, lab work & pertinent test results, reviewed documented beta blocker date and time   History of Anesthesia Complications Negative for: history of anesthetic complications  Airway Mallampati: III  TM Distance: >3 FB Neck ROM: full    Dental  (+) Teeth Intact   Pulmonary neg pulmonary ROS, former smoker,           Cardiovascular Exercise Tolerance: Good hypertension (gestational HTN), (-) angina(-) CAD, (-) Past MI, (-) Cardiac Stents and (-) CABG (-) dysrhythmias (-) Valvular Problems/Murmurs     Neuro/Psych negative neurological ROS  negative psych ROS   GI/Hepatic Neg liver ROS, GERD  ,  Endo/Other  neg diabetesMorbid obesity  Renal/GU negative Renal ROS  negative genitourinary   Musculoskeletal   Abdominal   Peds  Hematology negative hematology ROS (+)   Anesthesia Other Findings Past Medical History: No date: Medical history non-contributory   Reproductive/Obstetrics (+) Pregnancy                             Anesthesia Physical Anesthesia Plan  ASA: III  Anesthesia Plan: Epidural   Post-op Pain Management:    Induction:   Airway Management Planned:   Additional Equipment:   Intra-op Plan:   Post-operative Plan:   Informed Consent: I have reviewed the patients History and Physical, chart, labs and discussed the procedure including the risks, benefits and alternatives for the proposed anesthesia with the patient or authorized representative who has indicated his/her understanding and acceptance.   Dental Advisory Given  Plan Discussed with: Anesthesiologist, CRNA and Surgeon  Anesthesia Plan Comments:         Anesthesia Quick Evaluation

## 2016-01-25 ENCOUNTER — Inpatient Hospital Stay: Payer: 59 | Admitting: Anesthesiology

## 2016-01-25 LAB — RPR: RPR Ser Ql: NONREACTIVE

## 2016-01-25 MED ORDER — ONDANSETRON HCL 4 MG/2ML IJ SOLN: 4.0000 mg | INTRAMUSCULAR | Status: DC | PRN

## 2016-01-25 MED ORDER — LIDOCAINE HCL (PF) 1 % IJ SOLN
INTRAMUSCULAR | Status: DC | PRN
  Administered 2016-01-25: 4 mL

## 2016-01-25 MED ORDER — CALCIUM CARBONATE ANTACID 500 MG PO CHEW
1.0000 | CHEWABLE_TABLET | Freq: Once | ORAL | Status: AC
  Administered 2016-01-25: 200 mg via ORAL

## 2016-01-25 MED ORDER — WITCH HAZEL-GLYCERIN EX PADS: 1.0000 "application " | MEDICATED_PAD | CUTANEOUS | Status: DC | PRN

## 2016-01-25 MED ORDER — DIBUCAINE 1 % RE OINT: 1.0000 "application " | TOPICAL_OINTMENT | RECTAL | Status: DC | PRN

## 2016-01-25 MED ORDER — SODIUM CHLORIDE 0.9 % IV SOLN
INTRAVENOUS | Status: DC | PRN
  Administered 2016-01-25 (×2): 5 mL via EPIDURAL

## 2016-01-25 MED ORDER — HYDROCODONE-ACETAMINOPHEN 5-325 MG PO TABS: 1.0000 | ORAL_TABLET | Freq: Four times a day (QID) | ORAL | Status: DC | PRN

## 2016-01-25 MED ORDER — SIMETHICONE 80 MG PO CHEW: 80.0000 mg | CHEWABLE_TABLET | ORAL | Status: DC | PRN

## 2016-01-25 MED ORDER — BENZOCAINE-MENTHOL 20-0.5 % EX AERO: 1.0000 "application " | INHALATION_SPRAY | CUTANEOUS | Status: DC | PRN

## 2016-01-25 MED ORDER — LIDOCAINE-EPINEPHRINE (PF) 1.5 %-1:200000 IJ SOLN
INTRAMUSCULAR | Status: DC | PRN
  Administered 2016-01-25: 3 mL via EPIDURAL

## 2016-01-25 MED ORDER — DIPHENHYDRAMINE HCL 25 MG PO CAPS: 25.0000 mg | ORAL_CAPSULE | Freq: Four times a day (QID) | ORAL | Status: DC | PRN

## 2016-01-25 MED ORDER — ACETAMINOPHEN 325 MG PO TABS: 650.0000 mg | ORAL_TABLET | ORAL | Status: DC | PRN

## 2016-01-25 MED ORDER — FENTANYL 2.5 MCG/ML W/ROPIVACAINE 0.2% IN NS 100 ML EPIDURAL INFUSION (ARMC-ANES)
EPIDURAL | Status: DC | PRN
  Administered 2016-01-25: 10 mL/h via EPIDURAL

## 2016-01-25 MED ORDER — TETANUS-DIPHTH-ACELL PERTUSSIS 5-2.5-18.5 LF-MCG/0.5 IM SUSP: 0.5000 mL | Freq: Once | INTRAMUSCULAR | Status: DC

## 2016-01-25 MED ORDER — IBUPROFEN 600 MG PO TABS
600.0000 mg | ORAL_TABLET | Freq: Four times a day (QID) | ORAL | Status: DC
  Administered 2016-01-25 – 2016-01-26 (×5): 600 mg via ORAL
  Filled 2016-01-25 (×5): qty 1

## 2016-01-25 MED ORDER — SENNOSIDES-DOCUSATE SODIUM 8.6-50 MG PO TABS
2.0000 | ORAL_TABLET | ORAL | Status: DC
  Administered 2016-01-26: 2 via ORAL
  Filled 2016-01-25: qty 2

## 2016-01-25 MED ORDER — COCONUT OIL OIL: 1.0000 "application " | TOPICAL_OIL | Status: DC | PRN

## 2016-01-25 MED ORDER — CALCIUM CARBONATE ANTACID 500 MG PO CHEW
CHEWABLE_TABLET | ORAL | Status: AC
  Administered 2016-01-25: 200 mg via ORAL
  Filled 2016-01-25: qty 1

## 2016-01-25 MED ORDER — FENTANYL 2.5 MCG/ML W/ROPIVACAINE 0.2% IN NS 100 ML EPIDURAL INFUSION (ARMC-ANES): 10.0000 mL/h | EPIDURAL | Status: DC

## 2016-01-25 MED ORDER — PRENATAL MULTIVITAMIN CH
1.0000 | ORAL_TABLET | Freq: Every day | ORAL | Status: DC
  Administered 2016-01-25 – 2016-01-26 (×2): 1 via ORAL
  Filled 2016-01-25 (×2): qty 1

## 2016-01-25 MED ORDER — ONDANSETRON HCL 4 MG PO TABS: 4.0000 mg | ORAL_TABLET | ORAL | Status: DC | PRN

## 2016-01-25 NOTE — Discharge Summary (Signed)
OB Discharge Summary     Patient Name: Brittney Gill DOB: 01/23/1996 MRN: [id number]  Date of admission: 01/24/2016 Delivering MD: [name], Stephen D, MD  Date of Delivery: 01/25/2016  Date of discharge: 01/26/2016  Admitting diagnosis:  1) intrauterine pregnancy at 3922w1d  2) oligohydramnios  Intrauterine pregnancy: 4222w1d      Secondary diagnosis: None     Discharge diagnosis: Term Pregnancy Delivered, Oligohydramnios                                                               Post partum procedures:none  Augmentation: AROM and Pitocin  Complications: None  Hospital course:  Induction of Labor With Vaginal Delivery   20 y.o. yo G1P0 at 4922w1d was admitted to the hospital 01/24/2016 for induction of labor.  Indication for induction: oligohydramnios.  Patient had an uncomplicated labor course as follows: Membrane Rupture Time/Date: 3:48 AM ,01/25/2016   Intrapartum Procedures: Episiotomy: None [1]                                         Lacerations:  2nd degree [3]  Patient had delivery of a Viable infant.  Information for the patient's newborn:  Brittney Gill, Boy Brittney Gill [[id number]][[id number]]  Delivery Method: Vag-Spont   01/25/2016  Details of delivery can be found in separate delivery note.  Patient had a routine postpartum course. Patient is discharged home 01/26/16.   Physical exam  Vitals:   01/25/16 2004 01/26/16 0010 01/26/16 0345 01/26/16 0728  BP: 113/61 (!) 113/51 107/67 126/81  Pulse: 87 74 79 63  Resp: 18 17 18 20   Temp: 98.2 F (36.8 C) 98 F (36.7 C) 98.5 F (36.9 C) 98.6 F (37 C)  TempSrc: Oral Oral Oral Oral  SpO2: 99% 99% 99% 100%  Weight:      Height:       General: alert, cooperative and no distress Lochia: appropriate Uterine Fundus: firm Incision: N/A DVT Evaluation: No evidence of DVT seen on physical exam. No cords or calf tenderness. No significant calf/ankle edema.  Labs: Lab Results  Component Value Date   WBC 13.5 (H) 01/26/2016   HGB 10.6 (L)  01/26/2016   HCT 31.3 (L) 01/26/2016   MCV 86.5 01/26/2016   PLT 209 01/26/2016   CMP Latest Ref Rng & Units 01/22/2016  Glucose 65 - 99 mg/dL 74  BUN 6 - 20 mg/dL 9  Creatinine 7.820.44 - 9.561.00 mg/dL 2.130.55  Sodium 086135 - 578145 mmol/L 132(L)  Potassium 3.5 - 5.1 mmol/L 3.6  Chloride 101 - 111 mmol/L 102  CO2 22 - 32 mmol/L 20(L)  Calcium 8.9 - 10.3 mg/dL 8.9  Total Protein 6.5 - 8.1 g/dL 7.0  Total Bilirubin 0.3 - 1.2 mg/dL <4.6(N<0.1(L)  Alkaline Phos 38 - 126 U/L 102  AST 15 - 41 U/L 21  ALT 14 - 54 U/L 13(L)    Discharge instruction: per After Visit Summary.  Medications:    Medication List    TAKE these medications   multivitamin-prenatal 27-0.8 MG Tabs tablet Take 1 tablet by mouth daily at 12 noon.   ranitidine 150 MG tablet Commonly known as:  ZANTAC Take 150 mg by mouth 1 day  or 1 dose.   vitamin C 500 MG tablet Commonly known as:  ASCORBIC ACID Take 500 mg by mouth daily.       Diet: routine diet  Activity: Advance as tolerated. Pelvic rest for 6 weeks.   Outpatient follow up: Follow-up Information    [name], Stephen D, MD Follow up in 6 week(s).   Specialty:  Obstetrics and Gynecology Contact information: [address] [place] [address] [phone]             Postpartum contraception: IUD Mirena(plans) Rhogam Given postpartum: no Rubella vaccine given postpartum: no Varicella vaccine given postpartum: no TDaP given antepartum or postpartum: AP  Newborn Data: Live born Female Birth Weight: 7 lb 0.2 oz (3180 g) APGAR: 8, 9   Baby Feeding: Breast  Disposition:home with mother  SIGNED: Annamarie MajorPaul Satonya Lux, MD

## 2016-01-25 NOTE — Progress Notes (Signed)
Patient ID: Brittney Gill, female   DOB: 01/10/1996, 20 y.o.   MRN: [id number] Labor Check  Subj:  Complaints: comfortable w epidural   Obj:  BP 128/77   Pulse 76   Temp 97.7 F (36.5 C) (Oral)   Resp 18   Ht 5\' 5"  (1.651 m)   Wt 272 lb (123.4 kg)   SpO2 100%   BMI 45.26 kg/m  Dose (milli-units/min) Oxytocin: 2 milli-units/min  Cervix: Dilation: 10 / Effacement (%): 100 / Station: +1  AROM - clear Baseline FHR: 140    Variability: moderate    Accelerations: present    Decelerations: present (apparent late decelerations with ctx) Contractions: present frequency: not tracing well (about 1 q 5 minutes when tracing)  A/P: 20 y.o. G1P0 female at 5074w1d with IOL for oligohydramnios.  1.  Labor: AROM, continue pit.  Start pushing soon  2.  FWB: reassuring overall.  +accelerations to fetal scalp stimulation, Overall assessment: category 2  3.  GBS negative  4.  Pain: epidural working well 5.  Recheck: prn   Thomasene MohairStephen Sanaiya Welliver, MD 01/25/2016 3:51 AM

## 2016-01-25 NOTE — Anesthesia Procedure Notes (Signed)
Epidural Patient location during procedure: OB Start time: 01/25/2016 12:24 AM End time: 01/25/2016 12:38 AM  Staffing Anesthesiologist: Lenard SimmerKARENZ, Jaidan Stachnik Performed: anesthesiologist   Preanesthetic Checklist Completed: patient identified, site marked, surgical consent, pre-op evaluation, timeout performed, IV checked, risks and benefits discussed and monitors and equipment checked  Epidural Patient position: sitting Prep: ChloraPrep Patient monitoring: heart rate, continuous pulse ox and blood pressure Approach: midline Location: L3-L4 Injection technique: LOR saline  Needle:  Needle type: Tuohy  Needle gauge: 17 G Needle length: 9 cm and 9 Needle insertion depth: 7 cm Catheter type: closed end flexible Catheter size: 19 Gauge Catheter at skin depth: 12.5 cm Test dose: negative and 1.5% lidocaine with Epi 1:200 K  Assessment Sensory level: T10 Events: blood not aspirated, injection not painful, no injection resistance, negative IV test and no paresthesia  Additional Notes Pt. Evaluated and documentation done after procedure finished. Patient identified. Risks/Benefits/Options discussed with patient including but not limited to bleeding, infection, nerve damage, paralysis, failed block, incomplete pain control, headache, blood pressure changes, nausea, vomiting, reactions to medication both or allergic, itching and postpartum back pain. Confirmed with bedside nurse the patient's most recent platelet count. Confirmed with patient that they are not currently taking any anticoagulation, have any bleeding history or any family history of bleeding disorders. Patient expressed understanding and wished to proceed. All questions were answered. Sterile technique was used throughout the entire procedure. Please see nursing notes for vital signs. Test dose was given through epidural catheter and negative prior to continuing to dose epidural or start infusion. Warning signs of high block given to  the patient including shortness of breath, tingling/numbness in hands, complete motor block, or any concerning symptoms with instructions to call for help. Patient was given instructions on fall risk and not to get out of bed. All questions and concerns addressed with instructions to call with any issues or inadequate analgesia.   Patient tolerated the insertion well without immediate complications.Reason for block:procedure for pain

## 2016-01-26 ENCOUNTER — Encounter: Payer: Self-pay | Admitting: Emergency Medicine

## 2016-01-26 LAB — CBC
HCT: 31.3 % — ABNORMAL LOW (ref 35.0–47.0)
Hemoglobin: 10.6 g/dL — ABNORMAL LOW (ref 12.0–16.0)
MCH: 29.3 pg (ref 26.0–34.0)
MCHC: 33.9 g/dL (ref 32.0–36.0)
MCV: 86.5 fL (ref 80.0–100.0)
Platelets: 209 10*3/uL (ref 150–440)
RBC: 3.62 MIL/uL — ABNORMAL LOW (ref 3.80–5.20)
RDW: 14 % (ref 11.5–14.5)
WBC: 13.5 10*3/uL — ABNORMAL HIGH (ref 3.6–11.0)

## 2016-01-26 NOTE — Progress Notes (Signed)
Patient verbalized understanding with all discharge instructions and the need to make follow up appointments. Mom verbalized the need to attend appointment for repeat hearing screen. Patient discharge via wheelchair with auxillary.

## 2016-01-26 NOTE — Lactation Note (Signed)
Lactation Consultation Note  Patient Name: Casimer BilisKayla J Good ZOXWR'UToday's Date: 01/26/2016     Maternal Data  Mom given Medela Advanced pump and style to take home and [facility]  Fe[facility]    [facility]                      Lactation Tools Discussed/Used     Consult Status      Dyann KiefMarsha D Gibson Telleria 01/26/2016, 8:25 PM

## 2016-01-26 NOTE — Progress Notes (Signed)
Admit Date: 01/24/2016 Today's Date: 01/26/2016  Post Partum Day 1  Subjective:  no complaints, up ad lib, voiding and tolerating PO  Objective: Temp:  [98 F (36.7 C)-98.9 F (37.2 C)] 98.6 F (37 C) (11/03 0728) Pulse Rate:  [63-87] 63 (11/03 0728) Resp:  [17-20] 20 (11/03 0728) BP: (107-156)/(50-94) 126/81 (11/03 0728) SpO2:  [99 %-100 %] 100 % (11/03 0728)  Physical Exam:  General: alert, cooperative and no distress Lochia: appropriate Uterine Fundus: firm Incision: none DVT Evaluation: No evidence of DVT seen on physical exam.   Recent Labs  01/24/16 1200 01/26/16 0520  HGB 11.9* 10.6*  HCT 36.4 31.3*    Assessment/Plan: Discharge home, Breastfeeding and Infant doing well   LOS: 2 days   Brittney Gill [facility] 01/26/2016, 8:40 AM

## 2016-01-26 NOTE — Anesthesia Postprocedure Evaluation (Signed)
Anesthesia Post Note  Patient: Brittney Gill  Procedure(s) Performed: * No procedures listed *  Patient location during evaluation: Mother Baby Anesthesia Type: Epidural Level of consciousness: awake and alert Pain management: pain level controlled Vital Signs Assessment: post-procedure vital signs reviewed and stable Respiratory status: spontaneous breathing Cardiovascular status: blood pressure returned to baseline Postop Assessment: no headache Anesthetic complications: no    Last Vitals:  Vitals:   01/26/16 0345 01/26/16 0728  BP: 107/67 126/81  Pulse: 79 63  Resp: 18 20  Temp: 36.9 C 37 C    Last Pain:  Vitals:   01/26/16 0728  TempSrc: Oral  PainSc:                  Jules SchickLogan,  Kaynen Minner P

## 2016-03-27 DIAGNOSIS — Z30014 Encounter for initial prescription of intrauterine contraceptive device: Secondary | ICD-10-CM | POA: Diagnosis not present

## 2016-04-01 DIAGNOSIS — Z30430 Encounter for insertion of intrauterine contraceptive device: Secondary | ICD-10-CM | POA: Diagnosis not present

## 2016-04-01 DIAGNOSIS — Z113 Encounter for screening for infections with a predominantly sexual mode of transmission: Secondary | ICD-10-CM | POA: Diagnosis not present

## 2016-05-02 DIAGNOSIS — Z30431 Encounter for routine checking of intrauterine contraceptive device: Secondary | ICD-10-CM | POA: Diagnosis not present

## 2016-05-02 DIAGNOSIS — F419 Anxiety disorder, unspecified: Secondary | ICD-10-CM | POA: Diagnosis not present

## 2016-06-14 ENCOUNTER — Telehealth: Payer: Self-pay

## 2016-06-14 NOTE — Telephone Encounter (Signed)
Pt states her pain is worse in the mornings, has not tried tylenol yet. Still on her cycle and having cramps. I advised this can be normal when body first getting used to IUD and to let us know sometime within a week or two if it is not better. If pt calls back, schedule appt for IUD check.

## 2016-06-14 NOTE — Telephone Encounter (Signed)
Pt called.  She is having sharp pain in her lower left abd.  It hurts to touch when you press down.  Has IUD. Please call. [phone]

## 2016-07-01 ENCOUNTER — Encounter: Payer: Self-pay | Admitting: Obstetrics and Gynecology

## 2016-07-01 ENCOUNTER — Ambulatory Visit (INDEPENDENT_AMBULATORY_CARE_PROVIDER_SITE_OTHER): Payer: 59 | Admitting: Obstetrics and Gynecology

## 2016-07-01 VITALS — BP 124/74 | Ht 65.0 in | Wt 282.0 lb

## 2016-07-01 DIAGNOSIS — N920 Excessive and frequent menstruation with regular cycle: Secondary | ICD-10-CM

## 2016-07-01 DIAGNOSIS — F419 Anxiety disorder, unspecified: Secondary | ICD-10-CM | POA: Diagnosis not present

## 2016-07-01 DIAGNOSIS — T8389XA Other specified complication of genitourinary prosthetic devices, implants and grafts, initial encounter: Secondary | ICD-10-CM

## 2016-07-01 MED ORDER — BUPROPION HCL ER (XL) 150 MG PO TB24: 300.0000 mg | ORAL_TABLET | Freq: Every day | ORAL | 1 refills | Status: DC

## 2016-07-01 MED ORDER — ESTRADIOL 2 MG PO TABS: 2.0000 mg | ORAL_TABLET | Freq: Every day | ORAL | 1 refills | Status: DC

## 2016-07-01 NOTE — Progress Notes (Signed)
Obstetrics & Gynecology Office Visit   Chief Complaint  Patient presents with  . Follow-up    medication follow up (Zoloft)    History of Present Illness: Anxiety: Patient complains of anxiety disorder.  She has the following symptoms: feelings of losing control, irritable. Onset of symptoms was approximately 3 months ago, gradually worsening since that time. She denies current suicidal and homicidal ideation. Family history significant for anxiety.Possible organic causes contributing are: none. Risk factors: positive family history in  mother Previous treatment includes Zoloft and none.  She complains of the following side effects from the treatment: none.  She also has been having daily spotting from her Mirena IUD.  She has given the device 3 months so far. She would like something to help with her spotting. She has tried nothing so far.   Review of Systems: Review of Systems  Constitutional: Negative.   HENT: Negative.   Eyes: Negative.   Respiratory: Negative.   Cardiovascular: Negative.   Gastrointestinal: Negative.   Genitourinary:       See HPI  Musculoskeletal: Negative.   Skin: Negative.   Neurological: Negative.   Psychiatric/Behavioral: Negative for depression, hallucinations, memory loss, substance abuse and suicidal ideas. The patient is nervous/anxious and has insomnia.     Past Medical History:  Diagnosis Date  . Medical history non-contributory     Past Surgical History:  Procedure Laterality Date  . FRACTURE SURGERY     Left  . WISDOM TOOTH EXTRACTION      Gynecologic History: Patient's last menstrual period was 07/01/2016.  Obstetric History: G1P1001  Family History: History reviewed. No pertinent family history.  Social History   Social History  . Marital status: Single    Spouse name: N/A  . Number of children: N/A  . Years of education: N/A   Occupational History  . Not on file.   Social History Main Topics  . Smoking status: Former  Smoker    Types: Cigarettes    Quit date: 04/26/2015  . Smokeless tobacco: Never Used  . Alcohol use No  . Drug use: No  . Sexual activity: No   Other Topics Concern  . Not on file   Social History Narrative  . No narrative on file    Allergies  Allergen Reactions  . Diflucan [Fluconazole] Rash    Medications:   Medication Sig Start Date End Date Taking? Authorizing Provider  levonorgestrel (MIRENA) 20 MCG/24HR IUD 1 each by Intrauterine route once.   Yes Historical Provider, MD  sertraline (ZOLOFT) 50 MG tablet Take 50 mg by mouth daily.   Yes Historical Provider, MD  Prenatal Vit-Fe Fumarate-FA (MULTIVITAMIN-PRENATAL) 27-0.8 MG TABS tablet Take 1 tablet by mouth daily at 12 noon.    Historical Provider, MD  ranitidine (ZANTAC) 150 MG tablet Take 150 mg by mouth 1 day or 1 dose.    Historical Provider, MD  vitamin C (ASCORBIC ACID) 500 MG tablet Take 500 mg by mouth daily.    Historical Provider, MD    Physical Exam BP 124/74   Ht  (1.651 m)   Wt 282 lb (127.9 kg)   LMP 07/01/2016   BMI 46.93 kg/m   Patient's last menstrual period was 07/01/2016. Physical Exam  Constitutional: She is oriented to person, place, and time. She appears well-developed and well-nourished. No distress.  HENT:  Head: Normocephalic and atraumatic.  Eyes: Conjunctivae are normal.  Neck: Normal range of motion. Neck supple.  Cardiovascular: Normal rate, regular rhythm and  normal heart sounds.   Pulmonary/Chest: Effort normal and breath sounds normal.  Abdominal: Soft. She exhibits no distension and no mass. There is no guarding.  Musculoskeletal: Normal range of motion.  Neurological: She is alert and oriented to person, place, and time.  Skin: Skin is warm and dry. No rash noted.  Psychiatric: Her behavior is normal. Judgment normal.  Blunted affect   EPDS score: 11 (0 on #10)  Assessment: 21 y.o. G1P1001 with anxiety that has not responded well to a low dose of zoloft. Desires to  change to Wellbutrin.  Continues to spot with IUD in place.    Plan:    ICD-9-CM ICD-10-CM   1. Anxiety 300.00 F41.9 buPROPion (WELLBUTRIN XL) 150 MG 24 hr tablet   Patient requests change to Wellbutrin. Will change over and taper slowly on Zoloft, though she is on a low dose.  2. Menorrhagia due to intrauterine device (IUD) (HCC) 996.76 T83.89XA estradiol (ESTRACE) 2 MG tablet   626.2 N92.0    Estrogen give-back cyclically (21 days on, 7 days off)     Thomasene Mohair, MD 07/01/2016 8:33 AM

## 2016-08-05 ENCOUNTER — Encounter: Payer: Self-pay | Admitting: Obstetrics and Gynecology

## 2016-08-05 ENCOUNTER — Ambulatory Visit (INDEPENDENT_AMBULATORY_CARE_PROVIDER_SITE_OTHER): Payer: 59 | Admitting: Obstetrics and Gynecology

## 2016-08-05 VITALS — BP 120/80 | HR 81 | Ht 65.0 in | Wt 284.0 lb

## 2016-08-05 DIAGNOSIS — N920 Excessive and frequent menstruation with regular cycle: Secondary | ICD-10-CM

## 2016-08-05 DIAGNOSIS — T8389XA Other specified complication of genitourinary prosthetic devices, implants and grafts, initial encounter: Secondary | ICD-10-CM

## 2016-08-05 DIAGNOSIS — F419 Anxiety disorder, unspecified: Secondary | ICD-10-CM | POA: Diagnosis not present

## 2016-08-05 MED ORDER — BUPROPION HCL ER (XL) 450 MG PO TB24: 450.0000 mg | ORAL_TABLET | Freq: Every day | ORAL | 1 refills | Status: DC

## 2016-08-05 MED ORDER — NORGESTIMATE-ETH ESTRADIOL 0.25-35 MG-MCG PO TABS: 1.0000 | ORAL_TABLET | Freq: Every day | ORAL | 1 refills | Status: DC

## 2016-08-05 NOTE — Progress Notes (Addendum)
  History of Present Illness:  Brittney Gill is a 21 y.o. who was started on Wellbutrin   approximately 4 weeks ago. Since that time, she states that her symptoms show no change, perhaps slightly better. Denies SI/HI.Marland Kitchen. She also had been experiencing frequent spotting with her IUD. She was started on estradiol 2 mg at that visit. She notes no change in her daily spotting.  PHQ-9: 9  (#9 - 0) GAD-7: 10  PMHx: She  has a past medical history of Medical history non-contributory. Also,  has a past surgical history that includes Wisdom tooth extraction and Fracture surgery., family history is not on file.,  reports that she quit smoking about 15 months ago. Her smoking use included Cigarettes. She has never used smokeless tobacco. She reports that she does not drink alcohol or use drugs.  She has a current medication list which includes the following prescription(s): bupropion hcl er (xl), levonorgestrel, norgestimate-ethinyl estradiol, multivitamin-prenatal, ranitidine, and vitamin c. Also, is allergic to diflucan [fluconazole].  Review of Systems  Constitutional: Negative.   HENT: Negative.   Eyes: Negative.   Respiratory: Negative.   Cardiovascular: Negative.   Gastrointestinal: Negative.   Genitourinary: Negative.   Musculoskeletal: Negative.   Skin: Negative.   Neurological: Negative.   Psychiatric/Behavioral: Negative.     Physical Exam:  BP 120/80   Pulse 81   Ht 5\' 5"  (1.651 m)   Wt 284 lb (128.8 kg)   BMI 47.26 kg/m  Body mass index is 47.26 kg/m. Constitutional: Well nourished, well developed female in no acute distress.  Abdomen: diffusely non tender to palpation, non distended, and no masses, hernias Neuro: Grossly intact Psych:  Normal mood and affect.    Assessment:  Problem List Items Addressed This Visit    Anxiety - Primary   Relevant Medications   BuPROPion HCl ER, XL, 450 MG TB24   Menorrhagia due to intrauterine device (IUD) (HCC)   Relevant Medications   norgestimate-ethinyl estradiol (SPRINTEC 28) 0.25-35 MG-MCG tablet     Medication treatment is going adequately for her.  Plan: She will undergo increase to 450 mg of wellbutrin in her medical therapy. Will change to regular combined OCPs.   She was amenable to this plan and we will see her back in one month to assess progress.   Thomasene MohairStephen Aaria Happ, MD  [facility], [facility] 08/05/2016  8:36 AM

## 2016-08-06 ENCOUNTER — Other Ambulatory Visit: Payer: Self-pay

## 2016-08-06 ENCOUNTER — Telehealth: Payer: Self-pay

## 2016-08-06 NOTE — Telephone Encounter (Signed)
Trying to call pt, busy tone/phone busy tone? SDJ sent in new RX yesterday, pt could have went there before he had sent it in. Pt to check again with pharm since he sent in new RX.

## 2016-08-06 NOTE — Telephone Encounter (Signed)
Pt went to pharmacy yesterday after appt and increase in medication and was told that they would only give her what she had previously been given. Pt cb# [phone] thank you

## 2016-08-20 DIAGNOSIS — H6693 Otitis media, unspecified, bilateral: Secondary | ICD-10-CM | POA: Diagnosis not present

## 2016-08-20 DIAGNOSIS — J069 Acute upper respiratory infection, unspecified: Secondary | ICD-10-CM | POA: Diagnosis not present

## 2016-08-20 DIAGNOSIS — K529 Noninfective gastroenteritis and colitis, unspecified: Secondary | ICD-10-CM | POA: Diagnosis not present

## 2016-08-26 ENCOUNTER — Telehealth: Payer: Self-pay

## 2016-08-26 NOTE — Telephone Encounter (Signed)
Pt returned call.  Adv to use monistat 3d or 7d and if that doesn't help to be seen.  Pt voices understanding.

## 2016-08-26 NOTE — Telephone Encounter (Signed)
Pt calling - was given antibx for bilateral ear inf and now has yeast inf.  Would like something besides diflucan called (allergic).  LMTC.

## 2016-09-04 ENCOUNTER — Encounter: Payer: Self-pay | Admitting: Obstetrics and Gynecology

## 2016-09-04 ENCOUNTER — Other Ambulatory Visit: Payer: Self-pay | Admitting: Obstetrics and Gynecology

## 2016-09-04 ENCOUNTER — Ambulatory Visit (INDEPENDENT_AMBULATORY_CARE_PROVIDER_SITE_OTHER): Payer: 59 | Admitting: Obstetrics and Gynecology

## 2016-09-04 VITALS — BP 124/70 | Ht 65.0 in | Wt 282.0 lb

## 2016-09-04 DIAGNOSIS — T8389XA Other specified complication of genitourinary prosthetic devices, implants and grafts, initial encounter: Secondary | ICD-10-CM | POA: Diagnosis not present

## 2016-09-04 DIAGNOSIS — N920 Excessive and frequent menstruation with regular cycle: Secondary | ICD-10-CM | POA: Diagnosis not present

## 2016-09-04 DIAGNOSIS — F419 Anxiety disorder, unspecified: Secondary | ICD-10-CM

## 2016-09-04 MED ORDER — CITALOPRAM HYDROBROMIDE 10 MG PO TABS: 10.0000 mg | ORAL_TABLET | Freq: Every day | ORAL | 1 refills | Status: DC

## 2016-09-04 NOTE — Assessment & Plan Note (Signed)
Continue regular-strength combined OCPs

## 2016-09-04 NOTE — Progress Notes (Addendum)
Obstetrics & Gynecology Office Visit   Chief Complaint  Patient presents with  . Follow-up    Medication follow up    History of Present Illness: 21 y.o. 101P1001 female here for follow up on medication.  Last month she was increased to wellbutrin 450 mg.  She was also started on regular-dose combined OCPs.  She states her symptoms from her IUD are greatly improved, but still has some occasional light spotting.  Denies other IUD-related symptoms.   From a mood stand point she is improved, but notes that she is more moody than is normal for her. She denies SI/HI.  She would like to try to treat these mood symptoms.   PHQ-9: 6 GAD-7: 7   Past Medical History:  Diagnosis Date  . Medical history non-contributory     Past Surgical History:  Procedure Laterality Date  . FRACTURE SURGERY     Left  . WISDOM TOOTH EXTRACTION      Gynecologic History: Patient's last menstrual period was 09/02/2016.  Obstetric History: G1P1001  History reviewed. No pertinent family history.  Social History   Social History  . Marital status: Single    Spouse name: N/A  . Number of children: N/A  . Years of education: N/A   Occupational History  . Not on file.   Social History Main Topics  . Smoking status: Former Smoker    Types: Cigarettes    Quit date: 04/26/2015  . Smokeless tobacco: Never Used  . Alcohol use No  . Drug use: No  . Sexual activity: No   Other Topics Concern  . Not on file   Social History Narrative  . No narrative on file    Allergies  Allergen Reactions  . Diflucan [Fluconazole] Rash    Prior to Admission medications   Medication Sig Start Date End Date Taking? Authorizing Provider  BuPROPion HCl ER, XL, 450 MG TB24 Take 450 mg by mouth daily. Take 1 tab daily for first week, then increase to 2 tabs 08/05/16  Yes [name], Stephen D, MD  ranitidine (ZANTAC) 150 MG tablet Take 150 mg by mouth 1 day or 1 dose.   Yes [provider]  vitamin C  (ASCORBIC ACID) 500 MG tablet Take 500 mg by mouth daily.   Yes [provider]  levonorgestrel (MIRENA) 20 MCG/24HR IUD 1 each by Intrauterine route once.    [provider]  norgestimate-ethinyl estradiol (SPRINTEC 28) 0.25-35 MG-MCG tablet Take 1 tablet by mouth daily. 08/05/16 09/02/16  [name], Stephen D, MD  Prenatal Vit-Fe Fumarate-FA (MULTIVITAMIN-PRENATAL) 27-0.8 MG TABS tablet Take 1 tablet by mouth daily at 12 noon.    [provider]    Review of Systems  Constitutional: Negative.   HENT: Negative.   Eyes: Negative.   Respiratory: Negative.   Cardiovascular: Negative.   Gastrointestinal: Negative.   Genitourinary: Negative.   Musculoskeletal: Negative.   Skin: Negative.   Neurological: Negative.   Psychiatric/Behavioral: Negative for depression, hallucinations, memory loss, substance abuse and suicidal ideas. The patient is not nervous/anxious and does not have insomnia.        Mood swings     Physical Exam BP 124/70   Ht 5\' 5"  (1.651 m)   Wt 282 lb (127.9 kg)   LMP 09/02/2016   BMI 46.93 kg/m  Patient's last menstrual period was 09/02/2016. Physical Exam  Constitutional: She is oriented to person, place, and time. She appears well-developed and well-nourished. No distress.  HENT:  Head: Normocephalic and atraumatic.  Eyes: EOM are normal. No scleral icterus.  Cardiovascular: Normal rate and regular rhythm.   Pulmonary/Chest: Effort normal and breath sounds normal.  Abdominal: Soft. Bowel sounds are normal. She exhibits no distension and no mass. There is no tenderness. There is no rebound and no guarding.  Musculoskeletal: Normal range of motion. She exhibits no edema.  Neurological: She is alert and oriented to person, place, and time. No cranial nerve deficit.  Skin: Skin is warm and dry. No erythema.  Psychiatric: She has a normal mood and affect. Her behavior is normal. Judgment normal.    Assessment: 21 y.o. G1P1001 female  Menorrhagia due to intrauterine device (IUD) (HCC) Continue regular-strength combined OCPs  Anxiety Add Celexa 10 mg PO daily for mood swings Continue to follow monthly until no changes need to be made. Discussed that the goal is to get her to a steady emotional state, then treat for a while then try to back off the medications. She understands to notify us or go to the ER immediately if she develops suicidal ideation.    Plan: Problem List Items Addressed This Visit    Anxiety    Add Celexa 10 mg PO daily for mood swings Continue to follow monthly until no changes need to be made. Discussed that the goal is to get her to a steady emotional state, then treat for a while then try to back off the medications. She understands to notify us or go to the ER immediately if she develops suicidal ideation.       Relevant Medications   citalopram (CELEXA) 10 MG tablet   Menorrhagia due to intrauterine device (IUD) (HCC) - Primary    Continue regular-strength combined OCPs        Return in about 4 weeks (around 10/02/2016) for medication follow up.   Thomasene Mohair, MD 09/04/2016 9:13 AM

## 2016-09-04 NOTE — Assessment & Plan Note (Signed)
Add Celexa 10 mg PO daily for mood swings Continue to follow monthly until no changes need to be made. Discussed that the goal is to get her to a steady emotional state, then treat for a while then try to back off the medications. She understands to notify us or go to the ER immediately if she develops suicidal ideation.

## 2016-10-04 ENCOUNTER — Ambulatory Visit: Payer: 59 | Admitting: Obstetrics and Gynecology

## 2017-01-08 IMAGING — CT CT ANGIO CHEST
2 of 6 series · 18 of 46 positions shown · IV contrast (APPLIED)
Comparison: None.

CLINICAL DATA: 20-year-old female with right pleuritic chest pain

EXAM:
CT ANGIOGRAPHY CHEST WITH CONTRAST
TECHNIQUE: Multidetector CT imaging of the chest was performed using the
standard protocol during bolus administration of intravenous
contrast. Multiplanar CT image reconstructions and MIPs were
obtained to evaluate the vascular anatomy.
CONTRAST:  100 cc Isovue 370

[Series 5: thins · axial · 0.79mm/px · z∈[-246,-50]mm · 16 of 216 slices shown]
[im 10/216  lung]
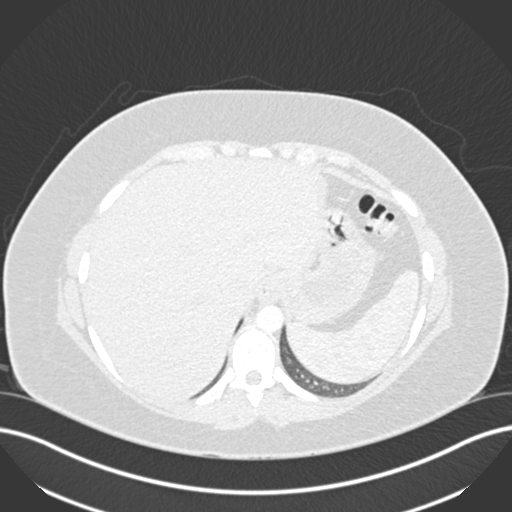
[im 29/216  soft-tissue]
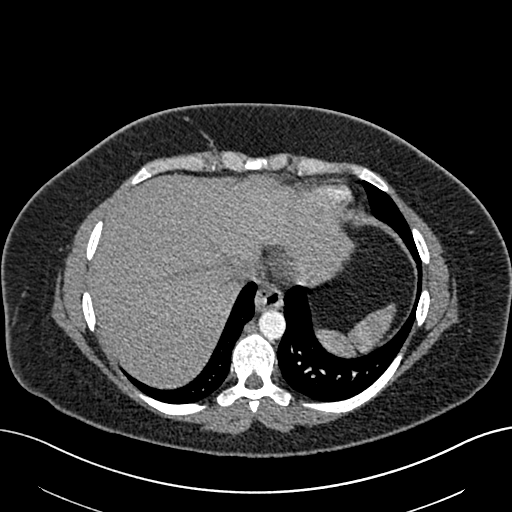
[im 38/216  lung]
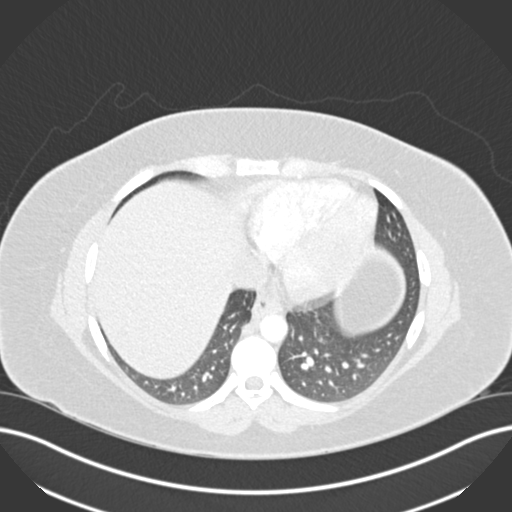
[im 47/216  soft-tissue]
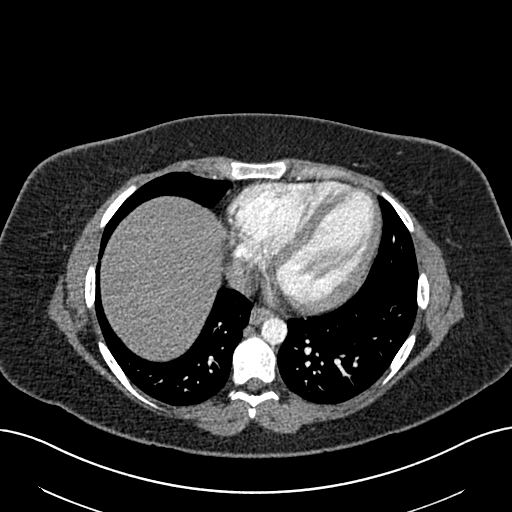
[im 66/216  lung]
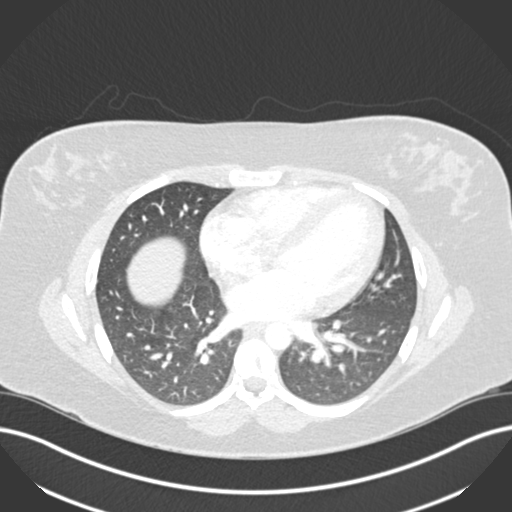
[im 75/216  soft-tissue]
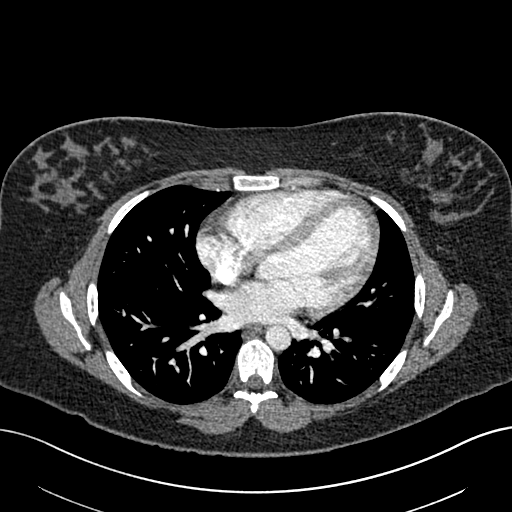
[im 85/216  lung]
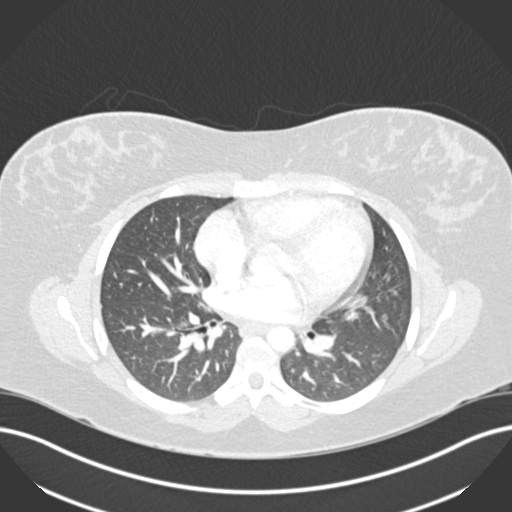
[im 103/216  soft-tissue]
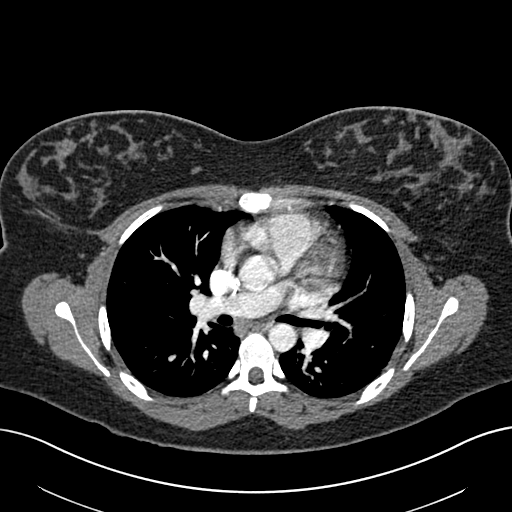
[im 113/216  lung]
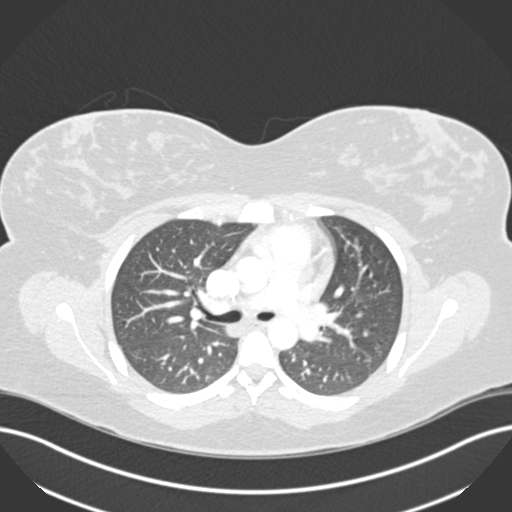
[im 131/216  soft-tissue]
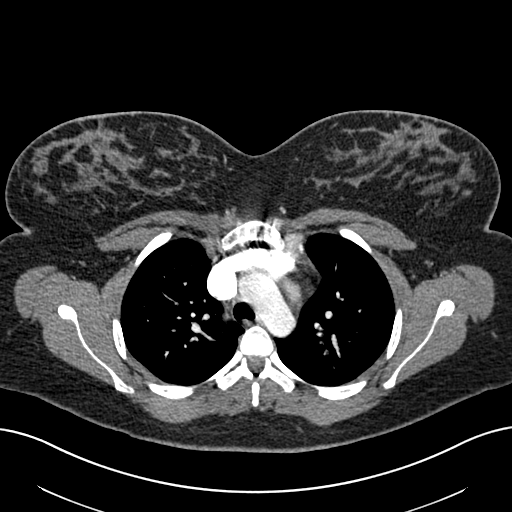
[im 141/216  lung]
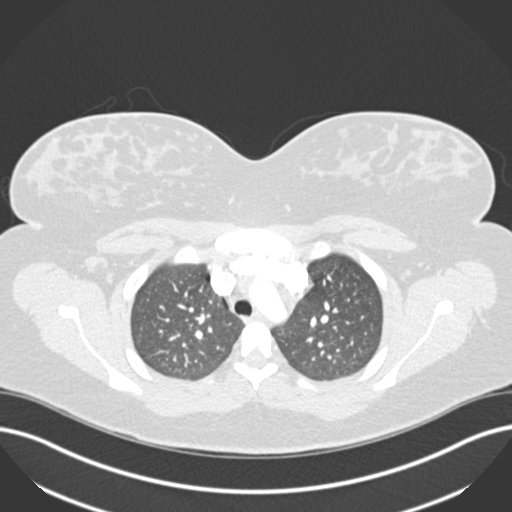
[im 150/216  soft-tissue]
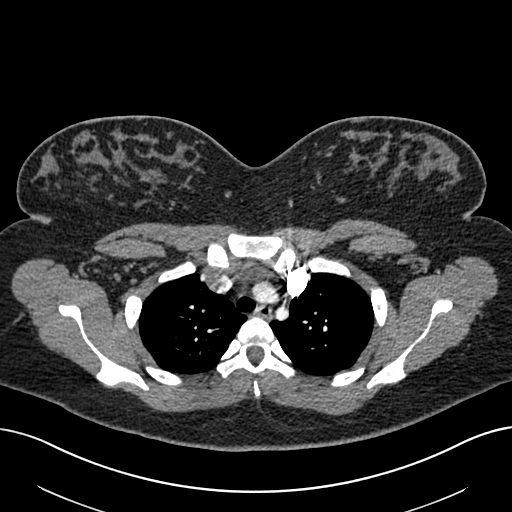
[im 169/216  lung]
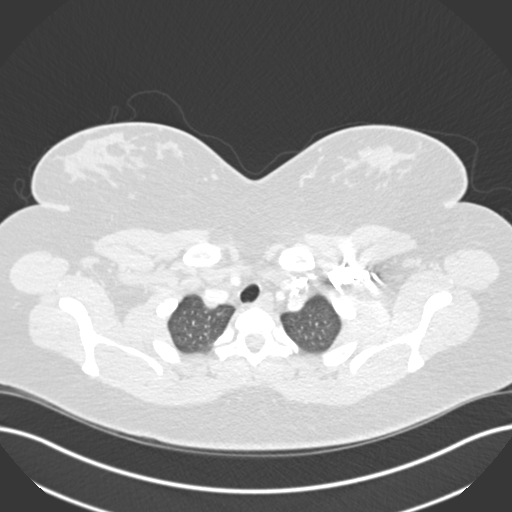
[im 178/216  soft-tissue]
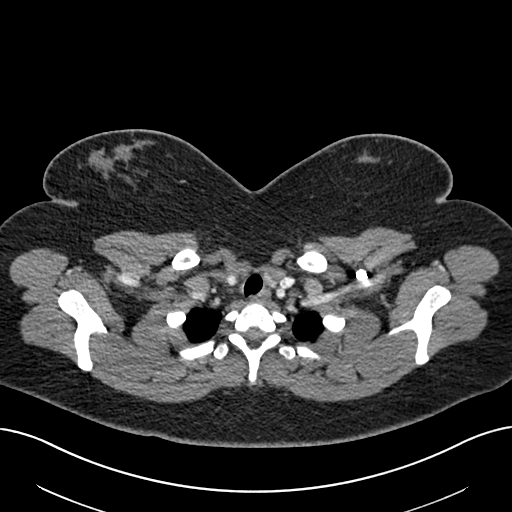
[im 187/216  lung]
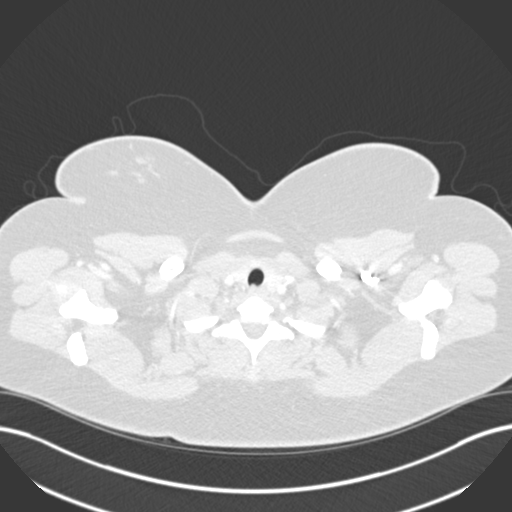
[im 206/216  soft-tissue]
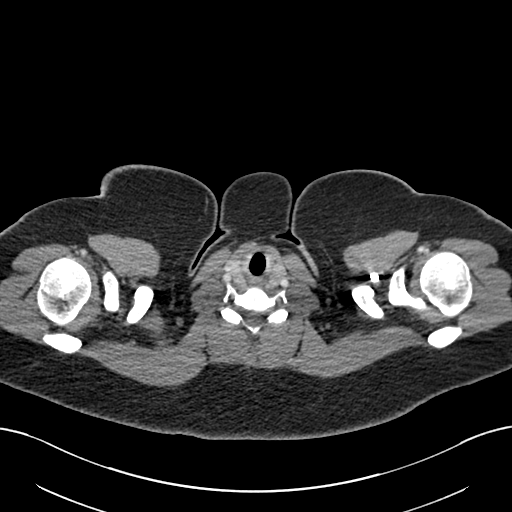

[Series 7: coronal mpr · coronal · 0.43mm/px · 2 of 79 slices shown]
[im 27/79  soft-tissue]
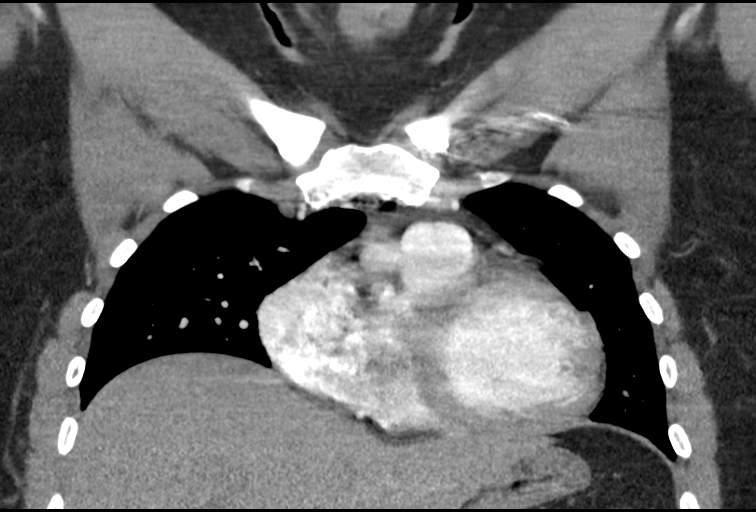
[im 53/79  soft-tissue]
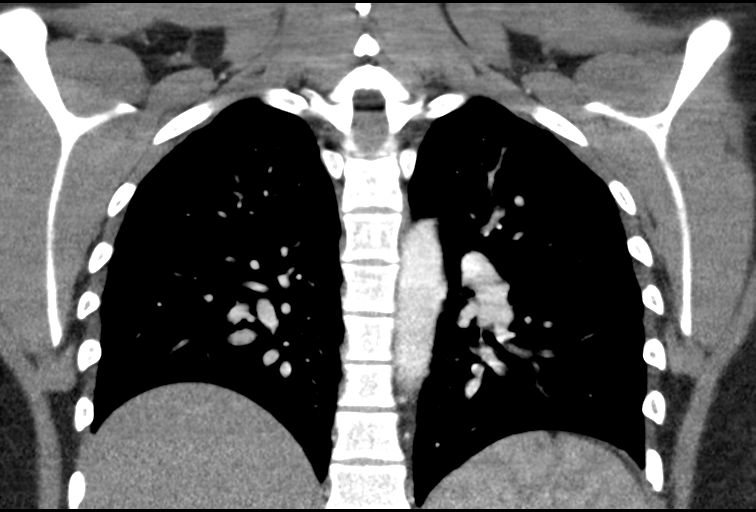

[18 of 46 positions shown; findings below may reference images not displayed]

FINDINGS: The lungs are clear. There is no pleural effusion or pneumothorax.
The central airways are patent.

The thoracic aorta appears unremarkable. On the origins of the great
vessels of the aortic arch appear patent. Evaluation of the
pulmonary arteries is limited due to suboptimal opacification. No
definite pulmonary artery embolus identified. Top-normal cardiac
size. No pericardial effusion. There is no hilar or mediastinal
adenopathy. The esophagus is grossly unremarkable. No thyroid
nodules identified. There is no axillary adenopathy. The chest wall
soft tissues appear unremarkable. The osseous structures are intact.

The visualized upper abdomen is grossly unremarkable.

Review of the MIP images confirms the above findings.
IMPRESSION: No acute intrathoracic pathology identified. No definite CT evidence
of central pulmonary artery embolus.

## 2017-03-05 DIAGNOSIS — M7989 Other specified soft tissue disorders: Secondary | ICD-10-CM | POA: Diagnosis not present

## 2017-03-05 DIAGNOSIS — M25571 Pain in right ankle and joints of right foot: Secondary | ICD-10-CM | POA: Diagnosis not present

## 2017-03-05 DIAGNOSIS — S99911A Unspecified injury of right ankle, initial encounter: Secondary | ICD-10-CM | POA: Diagnosis not present

## 2017-06-19 DIAGNOSIS — H52223 Regular astigmatism, bilateral: Secondary | ICD-10-CM | POA: Diagnosis not present

## 2017-10-13 ENCOUNTER — Encounter: Payer: Self-pay | Admitting: Obstetrics and Gynecology

## 2017-10-13 ENCOUNTER — Ambulatory Visit (INDEPENDENT_AMBULATORY_CARE_PROVIDER_SITE_OTHER): Payer: Managed Care, Other (non HMO) | Admitting: Obstetrics and Gynecology

## 2017-10-13 ENCOUNTER — Other Ambulatory Visit (HOSPITAL_COMMUNITY)
Admission: RE | Admit: 2017-10-13 | Discharge: 2017-10-13 | Disposition: A | Discharge status: Home / Self Care | Payer: 59 | Source: Ambulatory Visit | Attending: Obstetrics and Gynecology | Admitting: Obstetrics and Gynecology

## 2017-10-13 VITALS — BP 126/82 | HR 103 | Ht 65.0 in | Wt 283.5 lb

## 2017-10-13 DIAGNOSIS — Z124 Encounter for screening for malignant neoplasm of cervix: Secondary | ICD-10-CM | POA: Insufficient documentation

## 2017-10-13 DIAGNOSIS — Z01411 Encounter for gynecological examination (general) (routine) with abnormal findings: Secondary | ICD-10-CM | POA: Diagnosis not present

## 2017-10-13 DIAGNOSIS — Z113 Encounter for screening for infections with a predominantly sexual mode of transmission: Secondary | ICD-10-CM

## 2017-10-13 DIAGNOSIS — Z01419 Encounter for gynecological examination (general) (routine) without abnormal findings: Secondary | ICD-10-CM

## 2017-10-13 DIAGNOSIS — Z1331 Encounter for screening for depression: Secondary | ICD-10-CM | POA: Diagnosis not present

## 2017-10-13 DIAGNOSIS — Z1321 Encounter for screening for nutritional disorder: Secondary | ICD-10-CM

## 2017-10-13 DIAGNOSIS — F329 Major depressive disorder, single episode, unspecified: Secondary | ICD-10-CM | POA: Insufficient documentation

## 2017-10-13 DIAGNOSIS — Z1339 Encounter for screening examination for other mental health and behavioral disorders: Secondary | ICD-10-CM | POA: Diagnosis not present

## 2017-10-13 DIAGNOSIS — Z0000 Encounter for general adult medical examination without abnormal findings: Secondary | ICD-10-CM

## 2017-10-13 DIAGNOSIS — Z1329 Encounter for screening for other suspected endocrine disorder: Secondary | ICD-10-CM

## 2017-10-13 DIAGNOSIS — F331 Major depressive disorder, recurrent, moderate: Secondary | ICD-10-CM

## 2017-10-13 DIAGNOSIS — F419 Anxiety disorder, unspecified: Secondary | ICD-10-CM

## 2017-10-13 DIAGNOSIS — Z131 Encounter for screening for diabetes mellitus: Secondary | ICD-10-CM

## 2017-10-13 DIAGNOSIS — F32A Depression, unspecified: Secondary | ICD-10-CM | POA: Insufficient documentation

## 2017-10-13 MED ORDER — BUPROPION HCL ER (XL) 150 MG PO TB24: 150.0000 mg | ORAL_TABLET | Freq: Every day | ORAL | 0 refills | Status: DC

## 2017-10-13 NOTE — Progress Notes (Addendum)
Gynecology Annual Exam   PCP: Patient, No Pcp Per  Chief Complaint  Patient presents with  . Gynecologic Exam   History of Present Illness:  Ms. Casimer BilisKayla J Good is a 22 y.o. G1P1001 who LMP was No LMP recorded (lmp unknown)., presents today for her annual examination.  Her menses are irregular, lasting 2-7 day(s).  Dysmenorrhea moderate, occurring first 1-2 days of flow. She does not have intermenstrual bleeding.  Her menses are quite irregular with the IUD in place. She can go for months without a menses. However, the longer she goes, the heavier the menses.  She is single partner, contraception - IUD.  Last Pap: never done  Hx of STDs: none  There is no FH of breast cancer. There is no FH of ovarian cancer. The patient does not do self-breast exams.  Tobacco use: 1/4 ppd  Alcohol use: social drinker Exercise: not active  The patient wears seatbelts: yes.   The patient reports that domestic violence in her life is absent.   She is starting to have a return of her more severe anxiety symptoms. Her symptoms have gotten worse over the past 3-4 months.  She is taking nothing for her symptoms. She has been on Wellbutrin in the past (450 mg). She had started Celexa to help with mood swings.  She says the mood swings and she describes herself as being "snappy." She denies SI/HI.  She has little enjoyment of things she does in life.  She is not sleeping well.  She has little energy.  She feels guilty, difficulty concentrating, decreased appetite.   Past Medical History:  Diagnosis Date  . Medical history non-contributory     Past Surgical History:  Procedure Laterality Date  . FRACTURE SURGERY     Left  . WISDOM TOOTH EXTRACTION      Prior to Admission medications   Medication Sig Start Date End Date Taking? Authorizing Provider  levonorgestrel (MIRENA) 20 MCG/24HR IUD 1 each by Intrauterine route once.   Yes [provider]  omeprazole (PRILOSEC) 10 MG capsule Take 10 mg  by mouth daily.   Yes [provider]  vitamin C (ASCORBIC ACID) 500 MG tablet Take 500 mg by mouth daily.   Yes [provider]    Allergies  Allergen Reactions  . Other Itching    Vitamin E cream  . Diflucan [Fluconazole] Rash   Obstetric History: G1P1001  Social History   Socioeconomic History  . Marital status: Single    Spouse name: Not on file  . Number of children: Not on file  . Years of education: Not on file  . Highest education level: Not on file  Occupational History  . Not on file  Social Needs  . Financial resource strain: Not on file  . Food insecurity:    Worry: Not on file    Inability: Not on file  . Transportation needs:    Medical: Not on file    Non-medical: Not on file  Tobacco Use  . Smoking status: Current Every Day Smoker    Packs/day: 0.20    Types: Cigarettes    Last attempt to quit: 04/26/2015    Years since quitting: 2.4  . Smokeless tobacco: Never Used  Substance and Sexual Activity  . Alcohol use: No  . Drug use: No  . Sexual activity: Not Currently    Birth control/protection: IUD  Lifestyle  . Physical activity:    Days per week: Not on file  Minutes per session: Not on file  . Stress: Not on file  Relationships  . Social connections:    Talks on phone: Not on file    Gets together: Not on file    Attends religious service: Not on file    Active member of club or organization: Not on file    Attends meetings of clubs or organizations: Not on file    Relationship status: Not on file  . Intimate partner violence:    Fear of current or ex partner: Not on file    Emotionally abused: Not on file    Physically abused: Not on file    Forced sexual activity: Not on file  Other Topics Concern  . Not on file  Social History Narrative  . Not on file   Family History: denies gynecologic cancers in family  Review of Systems  Constitutional: Negative.   HENT: Positive for sore throat. Negative for congestion,  ear discharge, ear pain, hearing loss, nosebleeds, sinus pain and tinnitus.   Eyes: Negative.   Respiratory: Positive for cough. Negative for hemoptysis, sputum production, shortness of breath, wheezing and stridor.   Cardiovascular: Negative.   Gastrointestinal: Positive for diarrhea. Negative for abdominal pain, blood in stool, constipation, heartburn, melena, nausea and vomiting.  Genitourinary: Negative.   Musculoskeletal: Positive for joint pain. Negative for back pain, falls, myalgias and neck pain.  Skin: Negative.   Neurological: Negative.   Psychiatric/Behavioral: Positive for depression. Negative for hallucinations, memory loss, substance abuse and suicidal ideas. The patient is nervous/anxious. The patient does not have insomnia.      Physical Exam BP 126/82 (BP Location: Left Arm, Patient Position: Sitting, Cuff Size: Large)   Pulse (!) 103   Ht 5\' 5"  (1.651 m)   Wt 283 lb 8 oz (128.6 kg)   LMP  (LMP Unknown)   SpO2 97%   BMI 47.18 kg/m    Physical Exam  Constitutional: She is oriented to person, place, and time. She appears well-developed and well-nourished. No distress.  Genitourinary: Uterus normal. Pelvic exam was performed with patient supine. There is no rash, tenderness, lesion or injury on the right labia. There is no rash, tenderness, lesion or injury on the left labia. No erythema, tenderness or bleeding in the vagina. No signs of injury around the vagina. No vaginal discharge found. Right adnexum does not display mass, does not display tenderness and does not display fullness. Left adnexum does not display mass, does not display tenderness and does not display fullness. Cervix does not exhibit motion tenderness, lesion, discharge or polyp.   Uterus is mobile and anteverted. Uterus is not enlarged, tender or exhibiting a mass.  HENT:  Head: Normocephalic and atraumatic.  Eyes: EOM are normal. No scleral icterus.  Neck: Normal range of motion. Neck supple. No  thyromegaly present.  Cardiovascular: Normal rate and regular rhythm. Exam reveals no gallop and no friction rub.  No murmur heard. Pulmonary/Chest: Effort normal and breath sounds normal. No respiratory distress. She has no wheezes. She has no rales. Right breast exhibits no inverted nipple, no mass, no nipple discharge, no skin change and no tenderness. Left breast exhibits no inverted nipple, no mass, no nipple discharge, no skin change and no tenderness.  Abdominal: Soft. Bowel sounds are normal. She exhibits no distension and no mass. There is no tenderness. There is no rebound and no guarding.  Musculoskeletal: Normal range of motion. She exhibits no edema or tenderness.  Lymphadenopathy:    She has no  cervical adenopathy.       Right: No inguinal adenopathy present.       Left: No inguinal adenopathy present.  Neurological: She is alert and oriented to person, place, and time. No cranial nerve deficit.  Skin: Skin is warm and dry. No rash noted. No erythema.  Psychiatric: She has a normal mood and affect. Her behavior is normal. Judgment normal.    Female chaperone present for pelvic and breast  portions of the physical exam  Results: AUDIT Questionnaire (screen for alcoholism): 4 PHQ-9: 16 GAD-7: 17   Assessment: 22 y.o. G74P1001 female here for routine annual gynecologic examination  Plan: Problem List Items Addressed This Visit      Other   Anxiety   Relevant Medications   buPROPion (WELLBUTRIN XL) 150 MG 24 hr tablet   Depression   Relevant Medications   buPROPion (WELLBUTRIN XL) 150 MG 24 hr tablet    Other Visit Diagnoses    Women's annual routine gynecological examination    -  Primary   Relevant Medications   buPROPion (WELLBUTRIN XL) 150 MG 24 hr tablet   Other Relevant Orders   TSH + free T4   Hgb A1c w/o eAG   Comprehensive metabolic panel   VITAMIN D 25 Hydroxy (Vit-D Deficiency, Fractures)   Cytology - PAP   Screening for alcoholism       Screening  for depression       Screen for STD (sexually transmitted disease)       Relevant Orders   Cytology - PAP   Pap smear for cervical cancer screening       Relevant Orders   Cytology - PAP   Screening for diabetes mellitus       Relevant Orders   Hgb A1c w/o eAG   Screening for thyroid disorder       Relevant Orders   TSH + free T4   Laboratory examination ordered as part of a routine general medical examination       Relevant Orders   TSH + free T4   Hgb A1c w/o eAG   Comprehensive metabolic panel   VITAMIN D 25 Hydroxy (Vit-D Deficiency, Fractures)   Cytology - PAP   Encounter for vitamin deficiency screening       Relevant Orders   VITAMIN D 25 Hydroxy (Vit-D Deficiency, Fractures)      Screening: -- Blood pressure screen normal -- Weight screening: obese: discussed management options, including lifestyle, dietary, and exercise. -- Depression screening negative (PHQ-9) -- Nutrition: normal -- cholesterol screening: not due for screening -- osteoporosis screening: not due -- tobacco screening: using: discussed quitting using the 5 A's -- alcohol screening: AUDIT questionnaire indicates low-risk usage. -- family history of breast cancer screening: done. not at high risk. -- no evidence of domestic violence or intimate partner violence. -- STD screening: gonorrhea/chlamydia NAAT collected -- pap smear collected per ASCCP guidelines -- HPV vaccination series: discussed. patient will consider  20 minutes spent in face to face discussion with > 50% spent in counseling,management, and coordination of care of her anxiety and depression. Will start her on Wellbutrin XL 150 mg daily x 3 days, then increase to 300 mg. Will have her follow up in 1 month to see whether she needs an additional increase in dosage.  Precautions given for SI/HI and patient instructed to go to the ER should SI/HI develop.     Thomasene Mohair, MD 10/13/2017 3:41 PM

## 2017-10-14 LAB — COMPREHENSIVE METABOLIC PANEL
ALT: 31 IU/L (ref 0–32)
AST: 22 IU/L (ref 0–40)
Albumin/Globulin Ratio: 1.5 (ref 1.2–2.2)
Albumin: 4.4 g/dL (ref 3.5–5.5)
Alkaline Phosphatase: 79 IU/L (ref 39–117)
BUN/Creatinine Ratio: 12 (ref 9–23)
BUN: 8 mg/dL (ref 6–20)
Bilirubin Total: <0.2 mg/dL (ref 0.0–1.2)
CO2: 22 mmol/L (ref 20–29)
Calcium: 9.3 mg/dL (ref 8.7–10.2)
Chloride: 102 mmol/L (ref 96–106)
Creatinine, Ser: 0.65 mg/dL (ref 0.57–1.00)
GFR calc Af Amer: 146 mL/min/{1.73_m2} (ref >59)
GFR calc non Af Amer: 126 mL/min/{1.73_m2} (ref >59)
Globulin, Total: 2.9 g/dL (ref 1.5–4.5)
Glucose: 78 mg/dL (ref 65–99)
Potassium: 4.7 mmol/L (ref 3.5–5.2)
Sodium: 139 mmol/L (ref 134–144)
Total Protein: 7.3 g/dL (ref 6.0–8.5)

## 2017-10-14 LAB — VITAMIN D 25 HYDROXY (VIT D DEFICIENCY, FRACTURES): Vit D, 25-Hydroxy: 15.4 ng/mL — ABNORMAL LOW (ref 30.0–100.0)

## 2017-10-14 LAB — TSH+FREE T4
Free T4: 1.01 ng/dL (ref 0.82–1.77)
TSH: 2.05 u[IU]/mL (ref 0.450–4.500)

## 2017-10-14 LAB — HGB A1C W/O EAG: Hgb A1c MFr Bld: 5.4 % (ref 4.8–5.6)

## 2017-10-15 LAB — CYTOLOGY - PAP
Chlamydia: NEGATIVE
Diagnosis: NEGATIVE
Neisseria Gonorrhea: NEGATIVE

## 2017-10-16 ENCOUNTER — Other Ambulatory Visit: Payer: Self-pay | Admitting: Obstetrics and Gynecology

## 2017-10-16 DIAGNOSIS — E559 Vitamin D deficiency, unspecified: Secondary | ICD-10-CM | POA: Insufficient documentation

## 2017-10-16 MED ORDER — VITAMIN D (ERGOCALCIFEROL) 1.25 MG (50000 UNIT) PO CAPS: 50000.0000 [IU] | ORAL_CAPSULE | ORAL | 0 refills | Status: DC

## 2017-11-07 DIAGNOSIS — N390 Urinary tract infection, site not specified: Secondary | ICD-10-CM | POA: Diagnosis not present

## 2017-11-07 DIAGNOSIS — R197 Diarrhea, unspecified: Secondary | ICD-10-CM | POA: Diagnosis not present

## 2017-11-10 ENCOUNTER — Encounter: Payer: Self-pay | Admitting: Obstetrics and Gynecology

## 2017-11-10 ENCOUNTER — Ambulatory Visit (INDEPENDENT_AMBULATORY_CARE_PROVIDER_SITE_OTHER): Payer: Managed Care, Other (non HMO) | Admitting: Obstetrics and Gynecology

## 2017-11-10 VITALS — BP 106/60 | HR 88 | Ht 65.0 in | Wt 284.0 lb

## 2017-11-10 DIAGNOSIS — F331 Major depressive disorder, recurrent, moderate: Secondary | ICD-10-CM | POA: Diagnosis not present

## 2017-11-10 DIAGNOSIS — F419 Anxiety disorder, unspecified: Secondary | ICD-10-CM

## 2017-11-10 MED ORDER — CITALOPRAM HYDROBROMIDE 10 MG PO TABS: 10.0000 mg | ORAL_TABLET | Freq: Every day | ORAL | 1 refills | Status: DC

## 2017-11-10 MED ORDER — BUPROPION HCL ER (XL) 450 MG PO TB24: 450.0000 mg | ORAL_TABLET | Freq: Every day | ORAL | 1 refills | Status: DC

## 2017-11-10 NOTE — Progress Notes (Signed)
Obstetrics & Gynecology Office Visit   Chief Complaint  Patient presents with  . Follow-up    Pt states she feels like wellbutrin is helping some  . Medication Management   History of Present Illness: 22 y.o. 891P1001 female who presents in follow up from starting Wellbutrin for anxiety and depression.  She is taking 300 mg of Wellbutrin. Last year she needed to take medication for similar symptoms. She required taking Wellbutrin 450 mg and Celexa 10 mg.   She states that her anxiety has gotten worse since having her son.  She states that she feels worried about even driving in the car with her son due to safety issues.  She denies SI/HI.  She continues to take the high dose of Vitamin D.    PHQ9: 14 (16 on 10/13/17) GAD7: 18 (17 on 10/13/17)  Past Medical History:  Diagnosis Date  . Anxiety   . Depression    Past Surgical History:  Procedure Laterality Date  . FRACTURE SURGERY     Left  . WISDOM TOOTH EXTRACTION     Gynecologic History: Patient's last menstrual period was 11/09/2017.  Obstetric History: G1P1001  Family history: patient denies family history of gyn cancer and psychiatric issues  Social History   Socioeconomic History  . Marital status: Single    Spouse name: Not on file  . Number of children: Not on file  . Years of education: Not on file  . Highest education level: Not on file  Occupational History  . Not on file  Social Needs  . Financial resource strain: Not on file  . Food insecurity:    Worry: Not on file    Inability: Not on file  . Transportation needs:    Medical: Not on file    Non-medical: Not on file  Tobacco Use  . Smoking status: Current Every Day Smoker    Packs/day: 0.20    Types: Cigarettes    Last attempt to quit: 04/26/2015    Years since quitting: 2.5  . Smokeless tobacco: Never Used  Substance and Sexual Activity  . Alcohol use: No  . Drug use: No  . Sexual activity: Not Currently    Birth control/protection: IUD    Lifestyle  . Physical activity:    Days per week: Not on file    Minutes per session: Not on file  . Stress: Not on file  Relationships  . Social connections:    Talks on phone: Not on file    Gets together: Not on file    Attends religious service: Not on file    Active member of club or organization: Not on file    Attends meetings of clubs or organizations: Not on file    Relationship status: Not on file  . Intimate partner violence:    Fear of current or ex partner: Not on file    Emotionally abused: Not on file    Physically abused: Not on file    Forced sexual activity: Not on file  Other Topics Concern  . Not on file  Social History Narrative  . Not on file    Allergies  Allergen Reactions  . Other Itching    Vitamin E cream  . Diflucan [Fluconazole] Rash    Prior to Admission medications   Medication Sig Start Date End Date Taking? Authorizing Provider  buPROPion (WELLBUTRIN XL) 150 MG 24 hr tablet Take 1 tablet (150 mg total) by mouth daily. Take 1 tab days 1-3, then increase  to 2 tabs po daily 10/13/17  Yes [name], Stephen D, MD  ciprofloxacin (CIPRO) 500 MG tablet Take 500 mg by mouth 2 (two) times daily. 11/07/17  Yes [provider]  levonorgestrel (MIRENA) 20 MCG/24HR IUD 1 each by Intrauterine route once.   Yes [provider]  omeprazole (PRILOSEC) 10 MG capsule Take 10 mg by mouth daily.   Yes [provider]  Vitamin D, Ergocalciferol, (DRISDOL) 50000 units CAPS capsule Take 1 capsule (50,000 Units total) by mouth every 7 (seven) days. 10/16/17  Yes [name], Stephen D, MD  citalopram (CELEXA) 10 MG tablet Take 1 tablet (10 mg total) by mouth daily. Patient not taking: Reported on 10/13/2017 09/04/16   [name], Stephen D, MD  norgestimate-ethinyl estradiol (SPRINTEC 28) 0.25-35 MG-MCG tablet Take 1 tablet by mouth daily. 08/05/16 09/02/16  [name], Stephen D, MD  vitamin C (ASCORBIC ACID) 500 MG tablet Take 500 mg by mouth daily.     [provider]    Review of Systems  Constitutional: Negative.   HENT: Negative.   Eyes: Negative.   Respiratory: Negative.   Cardiovascular: Negative.   Gastrointestinal: Negative.   Genitourinary: Negative.   Musculoskeletal: Negative.   Skin: Negative.   Neurological: Negative.   Psychiatric/Behavioral: Positive for depression. Negative for hallucinations, memory loss, substance abuse and suicidal ideas. The patient is nervous/anxious. The patient does not have insomnia.      Physical Exam BP 106/60 (BP Location: Left Arm)   Pulse 88   Ht 5\' 5"  (1.651 m)   Wt 284 lb (128.8 kg)   LMP 11/09/2017   SpO2 98%   BMI 47.26 kg/m  Patient's last menstrual period was 11/09/2017. Physical Exam  Constitutional: She is oriented to person, place, and time. She appears well-developed and well-nourished. No distress.  HENT:  Head: Normocephalic and atraumatic.  Eyes: Conjunctivae are normal. No scleral icterus.  Neurological: She is alert and oriented to person, place, and time. No cranial nerve deficit.  Psychiatric: She has a normal mood and affect. Her behavior is normal. Judgment normal.    Female chaperone present for pelvic and breast  portions of the physical exam  Assessment: 22 y.o. 431P1001 female here for  1. Moderate episode of recurrent major depressive disorder (HCC)   2. Anxiety      Plan: Problem List Items Addressed This Visit      Other   Anxiety   Relevant Medications   buPROPion HCl ER, XL, 450 MG TB24   citalopram (CELEXA) 10 MG tablet   Depression - Primary   Relevant Medications   buPROPion HCl ER, XL, 450 MG TB24   citalopram (CELEXA) 10 MG tablet     Will increase Wellbutrin to 450 mg daily.  Will have her try this for one month. If not significantly improved, I have given her an Rx for celexa. She had similar symptoms one year ago and required both medications.  Discussed risks of taking the medication and possible side effects.  Discussed  contracting for safety, which she is able to do today. She understands that if she develops suicidal ideation to go the ER immediately for safety, which she agreed to do.    15 minutes spent in face to face discussion with > 50% spent in counseling,management, and coordination of care of her anxiety and depression.   Thomasene MohairStephen Jackson, MD 11/10/2017 9:43 AM

## 2018-01-12 ENCOUNTER — Encounter: Payer: Self-pay | Admitting: Obstetrics and Gynecology

## 2018-01-12 ENCOUNTER — Ambulatory Visit (INDEPENDENT_AMBULATORY_CARE_PROVIDER_SITE_OTHER): Payer: 59 | Admitting: Obstetrics and Gynecology

## 2018-01-12 VITALS — BP 128/74 | Ht 65.0 in | Wt 282.0 lb

## 2018-01-12 DIAGNOSIS — F331 Major depressive disorder, recurrent, moderate: Secondary | ICD-10-CM

## 2018-01-12 DIAGNOSIS — F419 Anxiety disorder, unspecified: Secondary | ICD-10-CM

## 2018-01-12 DIAGNOSIS — F418 Other specified anxiety disorders: Secondary | ICD-10-CM

## 2018-01-12 MED ORDER — BUPROPION HCL ER (XL) 450 MG PO TB24: 450.0000 mg | ORAL_TABLET | Freq: Every day | ORAL | 3 refills | Status: DC

## 2018-01-12 MED ORDER — CITALOPRAM HYDROBROMIDE 10 MG PO TABS: 10.0000 mg | ORAL_TABLET | Freq: Every day | ORAL | 3 refills | Status: DC

## 2018-01-12 NOTE — Progress Notes (Signed)
Obstetrics & Gynecology Office Visit   Chief Complaint  Patient presents with  . Follow-up  anxiety and depression medication management  History of Present Illness: 22 y.o. G8P1001 female who presents in follow up for Wellbutrin and Celexa therapy for management of anxiety and depression.   She is quite pleased at her current level of control of her symptoms.  She denies any side effects from the medication.   She took an increase dose of Wellbutrin for the first month and she added Celexa the second month due to insufficient response.   PHQ-9: 5 (14 on 8/19, 16 on 7/22) GAD-7: 8 (18 on 8/19 and 17 on 7/22)  Past Medical History:  Diagnosis Date  . Anxiety   . Depression     Past Surgical History:  Procedure Laterality Date  . FRACTURE SURGERY     Left  . WISDOM TOOTH EXTRACTION     Gynecologic History: Patient's last menstrual period was 12/25/2017.  Obstetric History: G1P1001  Family History: Denies history of gynecologic cancer.   Social History   Socioeconomic History  . Marital status: Single    Spouse name: Not on file  . Number of children: Not on file  . Years of education: Not on file  . Highest education level: Not on file  Occupational History  . Not on file  Social Needs  . Financial resource strain: Not on file  . Food insecurity:    Worry: Not on file    Inability: Not on file  . Transportation needs:    Medical: Not on file    Non-medical: Not on file  Tobacco Use  . Smoking status: Current Every Day Smoker    Packs/day: 0.20    Types: Cigarettes    Last attempt to quit: 04/26/2015    Years since quitting: 2.7  . Smokeless tobacco: Never Used  Substance and Sexual Activity  . Alcohol use: No  . Drug use: No  . Sexual activity: Not Currently    Birth control/protection: IUD  Lifestyle  . Physical activity:    Days per week: Not on file    Minutes per session: Not on file  . Stress: Not on file  Relationships  . Social connections:   Talks on phone: Not on file    Gets together: Not on file    Attends religious service: Not on file    Active member of club or organization: Not on file    Attends meetings of clubs or organizations: Not on file    Relationship status: Not on file  . Intimate partner violence:    Fear of current or ex partner: Not on file    Emotionally abused: Not on file    Physically abused: Not on file    Forced sexual activity: Not on file  Other Topics Concern  . Not on file  Social History Narrative  . Not on file    Allergies  Allergen Reactions  . Other Itching    Vitamin E cream  . Diflucan [Fluconazole] Rash    Prior to Admission medications   Medication Sig Start Date End Date Taking? Authorizing Provider  buPROPion HCl ER, XL, 450 MG TB24 Take 450 mg by mouth daily. 11/10/17   Conard Novak, MD  ciprofloxacin (CIPRO) 500 MG tablet Take 500 mg by mouth 2 (two) times daily. 11/07/17   [provider]  citalopram (CELEXA) 10 MG tablet Take 1 tablet (10 mg total) by mouth daily. 11/10/17   Jean Rosenthal,  Mila Homer, MD  levonorgestrel (MIRENA) 20 MCG/24HR IUD 1 each by Intrauterine route once.    [provider]  norgestimate-ethinyl estradiol (SPRINTEC 28) 0.25-35 MG-MCG tablet Take 1 tablet by mouth daily. 08/05/16 09/02/16  Conard Novak, MD  omeprazole (PRILOSEC) 10 MG capsule Take 10 mg by mouth daily.    [provider]  vitamin C (ASCORBIC ACID) 500 MG tablet Take 500 mg by mouth daily.    [provider]  Vitamin D, Ergocalciferol, (DRISDOL) 50000 units CAPS capsule Take 1 capsule (50,000 Units total) by mouth every 7 (seven) days. 10/16/17   Conard Novak, MD    Review of Systems  Constitutional: Negative.   HENT: Negative.   Eyes: Negative.   Respiratory: Negative.   Cardiovascular: Negative.   Gastrointestinal: Negative.   Genitourinary: Negative.   Musculoskeletal: Negative.   Skin: Negative.   Neurological: Negative.     Psychiatric/Behavioral: Positive for depression. The patient is nervous/anxious.      Physical Exam BP 128/74   Ht 5\' 5"  (1.651 m)   Wt 282 lb (127.9 kg)   LMP 12/25/2017   BMI 46.93 kg/m  Patient's last menstrual period was 12/25/2017. Physical Exam  Constitutional: She is oriented to person, place, and time. She appears well-developed and well-nourished. No distress.  HENT:  Head: Normocephalic and atraumatic.  Eyes: Conjunctivae are normal. No scleral icterus.  Pulmonary/Chest: No respiratory distress.  Musculoskeletal: Normal range of motion. She exhibits no edema.  Neurological: She is alert and oriented to person, place, and time. No cranial nerve deficit.  Skin: Skin is warm and dry. No erythema.  Psychiatric: She has a normal mood and affect. Her behavior is normal. Judgment normal.    Assessment: 22 y.o. G102P1001 female here for  1. Moderate episode of recurrent major depressive disorder (HCC)   2. Anxiety      Plan: Problem List Items Addressed This Visit      Other   Anxiety   Relevant Medications   buPROPion HCl ER, XL, 450 MG TB24   citalopram (CELEXA) 10 MG tablet   Depression - Primary   Relevant Medications   buPROPion HCl ER, XL, 450 MG TB24   citalopram (CELEXA) 10 MG tablet     Patient much improved since last visit.  We will continue therapy at current dosing.  She has been on Celexa for about 1 month now and is likely going to see some slight improvement in symptoms.  However, she was given precautions should her symptoms worsen.  She is able to contract for safety today and is quite pleased with her improvement.  I will have her follow-up in clinic in 3 months to assess need for continued treatment.  If she is doing very well and would like to continue the current treatment, she may send me a message on my chart and will extend the prescription length out for some time.  However, I would like to see her back at a minimum of about 6 months to consider  whether she may stop the medication or if she needs to continue.  I reiterated safety precautions, such as suicidal ideation, homicidal ideation, or any other concerning symptoms for which she agrees to go to the emergency room immediately should she develop any of the symptoms.  15 minutes spent in face to face discussion with > 50% spent in counseling,management, and coordination of care of her anxiety and depression.   Thomasene Mohair, MD 01/12/2018 8:35 AM

## 2018-01-19 DIAGNOSIS — Z3201 Encounter for pregnancy test, result positive: Secondary | ICD-10-CM | POA: Diagnosis not present

## 2018-01-19 DIAGNOSIS — R112 Nausea with vomiting, unspecified: Secondary | ICD-10-CM | POA: Diagnosis not present

## 2018-02-02 ENCOUNTER — Other Ambulatory Visit (HOSPITAL_COMMUNITY)
Admission: RE | Admit: 2018-02-02 | Discharge: 2018-02-02 | Disposition: A | Discharge status: Home / Self Care | Payer: 59 | Source: Ambulatory Visit | Attending: Obstetrics and Gynecology | Admitting: Obstetrics and Gynecology

## 2018-02-02 ENCOUNTER — Ambulatory Visit (INDEPENDENT_AMBULATORY_CARE_PROVIDER_SITE_OTHER): Payer: 59 | Admitting: Obstetrics and Gynecology

## 2018-02-02 ENCOUNTER — Encounter: Payer: Self-pay | Admitting: Obstetrics and Gynecology

## 2018-02-02 VITALS — BP 126/74 | Wt 284.0 lb

## 2018-02-02 DIAGNOSIS — O0990 Supervision of high risk pregnancy, unspecified, unspecified trimester: Secondary | ICD-10-CM | POA: Diagnosis not present

## 2018-02-02 DIAGNOSIS — F419 Anxiety disorder, unspecified: Secondary | ICD-10-CM | POA: Diagnosis not present

## 2018-02-02 DIAGNOSIS — Z6841 Body Mass Index (BMI) 40.0 and over, adult: Secondary | ICD-10-CM | POA: Diagnosis not present

## 2018-02-02 DIAGNOSIS — Z131 Encounter for screening for diabetes mellitus: Secondary | ICD-10-CM

## 2018-02-02 DIAGNOSIS — O99211 Obesity complicating pregnancy, first trimester: Secondary | ICD-10-CM | POA: Diagnosis not present

## 2018-02-02 DIAGNOSIS — O09899 Supervision of other high risk pregnancies, unspecified trimester: Secondary | ICD-10-CM

## 2018-02-02 DIAGNOSIS — Z113 Encounter for screening for infections with a predominantly sexual mode of transmission: Secondary | ICD-10-CM

## 2018-02-02 DIAGNOSIS — Z3A01 Less than 8 weeks gestation of pregnancy: Secondary | ICD-10-CM

## 2018-02-02 DIAGNOSIS — O09891 Supervision of other high risk pregnancies, first trimester: Secondary | ICD-10-CM

## 2018-02-02 HISTORY — DX: Supervision of other high risk pregnancies, unspecified trimester: O09.899

## 2018-02-02 NOTE — Progress Notes (Addendum)
New Obstetric Patient H&P   Chief Complaint: "Desires prenatal care"  History of Present Illness: Patient is a 22 y.o. G2P1001 Not Hispanic or Latino female, LMP 12/25/2017 presents with amenorrhea and positive home pregnancy test. Based on her  LMP, her EDD is Estimated Date of Delivery: 10/01/18 and her EGA is [redacted]w[redacted]d. Cycles are 4. days, regular, and occur approximately every : 28 days. Her last pap smear was 1 years ago and was no abnormalities.    She had a urine pregnancy test which was positive 2 week(s)  ago. Her last menstrual period was normal and lasted for  4 or 5 day(s). She had an IUD in place when she found out she was pregnant.  She went to Urgent Care due to "not feeling well."  She was found to be pregnant.  She had her IUD removed 10/25.  Since her LMP she claims she has experienced no issues since her IUD was removed.  She denies vaginal bleeding. Her past medical history is noncontributory. Her prior pregnancies are notable for oligohydramnios requiring IOL.  Additionally, she has been treated for anxiety and depression with bupropion as well as citalopram and was under good control.  She has stopped taking these medications recently.  Her EPDS today is 5 and she states that she feels quite well.   Since her LMP, she admits to the use of tobacco products  no She claims she has gained zero pounds since the start of her pregnancy.  There are cats in the home in the home  no  She admits close contact with children on a regular basis  yes  She has had chicken pox in the past yes She has had Tuberculosis exposures, symptoms, or previously tested positive for TB   no Current or past history of domestic violence. no  Genetic Screening/Teratology Counseling: (Includes patient, baby's father, or anyone in either family with:)   1. Patient's age >/= 43 at [facility]  no 2. Thalassemia ([place], [place], Mediterranean, or Asian background): MCV<80  no 3. Neural tube defect (meningomyelocele,  spina bifida, anencephaly)  no 4. Congenital heart defect  no  5. Down syndrome  no 6. Tay-Sachs (Jewish, [place]  no 7. Canavan's Disease  no 8. Sickle cell disease or trait (African)  no  9. Hemophilia or other blood disorders  no  10. Muscular dystrophy  no  11. Cystic fibrosis  no  12. Huntington's Chorea  no  13. Mental retardation/autism  no 14. Other inherited genetic or chromosomal disorder  no 15. Maternal metabolic disorder (DM, PKU, etc)  no 16. Patient or FOB with a child with a birth defect not listed above no  16a. Patient or FOB with a birth defect themselves no 17. Recurrent pregnancy loss, or stillbirth  no  18. Any medications since LMP other than prenatal vitamins (include vitamins, supplements, OTC meds, drugs, alcohol)  no 19. Any other genetic/environmental exposure to discuss  no  Infection History:   1. Lives with someone with TB or TB exposed  no  2. Patient or partner has history of genital herpes  no 3. Rash or viral illness since LMP  no 4. History of STI (GC, CT, HPV, syphilis, HIV)  no 5. History of recent travel :  no  Other pertinent information:  no   Review of Systems:10 point review of systems negative unless otherwise noted in HPI  Past Medical History:  Diagnosis Date  . Anxiety   . Depression  Past Surgical History:  Procedure Laterality Date  . FRACTURE SURGERY     Left  . WISDOM TOOTH EXTRACTION      Gynecologic History: Patient's last menstrual period was 12/25/2017.  Obstetric History: G2P1001, s/p SVD  Family History: denies history of gynecologic cancer. Also, see history in HPI that is specific to family history.  Social History   Socioeconomic History  . Marital status: Single    Spouse name: Not on file  . Number of children: Not on file  . Years of education: Not on file  . Highest education level: Not on file  Occupational History  . Not on file  Social Needs  . Financial resource strain: Not on  file  . Food insecurity:    Worry: Not on file    Inability: Not on file  . Transportation needs:    Medical: Not on file    Non-medical: Not on file  Tobacco Use  . Smoking status: Current Every Day Smoker    Packs/day: 0.20    Types: Cigarettes    Last attempt to quit: 04/26/2015    Years since quitting: 2.7  . Smokeless tobacco: Never Used  Substance and Sexual Activity  . Alcohol use: No  . Drug use: No  . Sexual activity: Not Currently    Birth control/protection: IUD  Lifestyle  . Physical activity:    Days per week: Not on file    Minutes per session: Not on file  . Stress: Not on file  Relationships  . Social connections:    Talks on phone: Not on file    Gets together: Not on file    Attends religious service: Not on file    Active member of club or organization: Not on file    Attends meetings of clubs or organizations: Not on file    Relationship status: Not on file  . Intimate partner violence:    Fear of current or ex partner: Not on file    Emotionally abused: Not on file    Physically abused: Not on file    Forced sexual activity: Not on file  Other Topics Concern  . Not on file  Social History Narrative  . Not on file    Allergies  Allergen Reactions  . Other Itching    Vitamin E cream  . Diflucan [Fluconazole] Rash    Prior to Admission medications   Medication Sig Start Date End Date Taking? Authorizing Provider  Prenatal Vit-Fe Fumarate-FA (PRENATAL VITAMIN PO) Take by mouth.   Yes [provider]  omeprazole (PRILOSEC) 10 MG capsule Take 10 mg by mouth daily.    [provider]  vitamin C (ASCORBIC ACID) 500 MG tablet Take 500 mg by mouth daily.    [provider]    Physical Exam BP 126/74   Wt 284 lb (128.8 kg)   LMP 12/25/2017   BMI 47.26 kg/m   Physical Exam  Constitutional: She is oriented to person, place, and time. She appears well-developed and well-nourished. No distress.  HENT:  Head:  Normocephalic and atraumatic.  Eyes: Conjunctivae are normal. No scleral icterus.  Neck: Normal range of motion. No thyromegaly present.  Cardiovascular: Normal rate and regular rhythm. Exam reveals no gallop and no friction rub.  No murmur heard. Pulmonary/Chest: Effort normal and breath sounds normal. No respiratory distress. She has no wheezes. She has no rales.  Abdominal: Soft. Bowel sounds are normal. She exhibits no distension and no mass. There is no tenderness.  There is no rebound and no guarding.  Genitourinary: Vagina normal and uterus normal. Pelvic exam was performed with patient supine. There is no rash, tenderness or lesion on the right labia. There is no rash, tenderness or lesion on the left labia. Cervix exhibits no motion tenderness, no discharge and no friability. Right adnexum displays no mass, no tenderness and no fullness. Left adnexum displays no mass, no tenderness and no fullness.  Genitourinary Comments: Exam limited by patient's body habitus  Musculoskeletal: Normal range of motion. She exhibits no edema.  Lymphadenopathy:    She has no cervical adenopathy.  Neurological: She is alert and oriented to person, place, and time. No cranial nerve deficit.  Skin: Skin is warm and dry.  Psychiatric: She has a normal mood and affect. Her behavior is normal. Judgment normal.     Female Chaperone present during breast and/or pelvic exam.   Assessment: 22 y.o. G2P1001 at [redacted]w[redacted]d presenting to initiate prenatal care  Plan: 1) Avoid alcoholic beverages. 2) Patient encouraged not to smoke.  3) Discontinue the use of all non-medicinal drugs and chemicals.  4) Take prenatal vitamins daily.  5) Nutrition, food safety (fish, cheese advisories, and high nitrite foods) and exercise discussed. 6) Hospital and practice style discussed with cross coverage system.  7) Genetic Screening, such as with 1st Trimester Screening, cell free fetal DNA, AFP testing, and Ultrasound, as well as  with amniocentesis and CVS as appropriate, is discussed with patient. At the conclusion of today's visit patient undecided genetic testing 8) Patient is asked about travel to areas at risk for the [place] virus, and counseled to avoid travel and exposure to mosquitoes or sexual partners who may have themselves been exposed to the virus. Testing is discussed, and will be ordered as appropriate.  9) MUST ESTABLISH PREGNANCY LOCATION, GIVEN RISK OF ECTOPIC PREGNANCY THAT OCCURS WHEN OCCURS WITH IUD IN PLACE.    Thomasene Mohair, MD 02/02/2018 2:59 PM

## 2018-02-03 LAB — URINE DRUG PANEL 7
Amphetamines, Urine: NEGATIVE ng/mL
Barbiturate Quant, Ur: NEGATIVE ng/mL
Benzodiazepine Quant, Ur: NEGATIVE ng/mL
Cannabinoid Quant, Ur: NEGATIVE ng/mL
Cocaine (Metab.): NEGATIVE ng/mL
Opiate Quant, Ur: NEGATIVE ng/mL
PCP Quant, Ur: NEGATIVE ng/mL

## 2018-02-03 LAB — RPR+RH+ABO+RUB AB+AB SCR+CB...
Antibody Screen: NEGATIVE
HIV Screen 4th Generation wRfx: NONREACTIVE
Hematocrit: 38.2 % (ref 34.0–46.6)
Hemoglobin: 12.3 g/dL (ref 11.1–15.9)
Hepatitis B Surface Ag: NEGATIVE
MCH: 26.3 pg — ABNORMAL LOW (ref 26.6–33.0)
MCHC: 32.2 g/dL (ref 31.5–35.7)
MCV: 82 fL (ref 79–97)
Platelets: 398 10*3/uL (ref 150–450)
RBC: 4.67 x10E6/uL (ref 3.77–5.28)
RDW: 14.2 % (ref 12.3–15.4)
RPR Ser Ql: NONREACTIVE
Rh Factor: POSITIVE
Rubella Antibodies, IGG: 10.8 index (ref >0.99)
Varicella zoster IgG: 2354 index (ref >165)
WBC: 11.9 10*3/uL — ABNORMAL HIGH (ref 3.4–10.8)

## 2018-02-03 LAB — BETA HCG QUANT (REF LAB): hCG Quant: 13056 m[IU]/mL

## 2018-02-04 ENCOUNTER — Encounter: Payer: Self-pay | Admitting: Obstetrics and Gynecology

## 2018-02-04 LAB — CERVICOVAGINAL ANCILLARY ONLY
Chlamydia: NEGATIVE
Neisseria Gonorrhea: NEGATIVE

## 2018-02-04 LAB — URINE CULTURE

## 2018-02-10 ENCOUNTER — Other Ambulatory Visit: Payer: Self-pay | Admitting: Obstetrics and Gynecology

## 2018-02-10 ENCOUNTER — Encounter: Payer: Self-pay | Admitting: Obstetrics and Gynecology

## 2018-02-10 ENCOUNTER — Ambulatory Visit (INDEPENDENT_AMBULATORY_CARE_PROVIDER_SITE_OTHER): Payer: 59 | Admitting: Obstetrics and Gynecology

## 2018-02-10 ENCOUNTER — Ambulatory Visit (INDEPENDENT_AMBULATORY_CARE_PROVIDER_SITE_OTHER): Payer: 59

## 2018-02-10 VITALS — BP 122/78 | Wt 286.0 lb

## 2018-02-10 DIAGNOSIS — O99210 Obesity complicating pregnancy, unspecified trimester: Secondary | ICD-10-CM | POA: Insufficient documentation

## 2018-02-10 DIAGNOSIS — Z3A01 Less than 8 weeks gestation of pregnancy: Secondary | ICD-10-CM

## 2018-02-10 DIAGNOSIS — O3411 Maternal care for benign tumor of corpus uteri, first trimester: Secondary | ICD-10-CM | POA: Diagnosis not present

## 2018-02-10 DIAGNOSIS — Z6841 Body Mass Index (BMI) 40.0 and over, adult: Secondary | ICD-10-CM

## 2018-02-10 DIAGNOSIS — O0990 Supervision of high risk pregnancy, unspecified, unspecified trimester: Secondary | ICD-10-CM

## 2018-02-10 DIAGNOSIS — N8311 Corpus luteum cyst of right ovary: Secondary | ICD-10-CM | POA: Diagnosis not present

## 2018-02-10 DIAGNOSIS — O99211 Obesity complicating pregnancy, first trimester: Secondary | ICD-10-CM | POA: Diagnosis not present

## 2018-02-10 DIAGNOSIS — O09891 Supervision of other high risk pregnancies, first trimester: Secondary | ICD-10-CM

## 2018-02-10 DIAGNOSIS — F418 Other specified anxiety disorders: Secondary | ICD-10-CM

## 2018-02-10 NOTE — Progress Notes (Signed)
Routine Prenatal Care Visit  Subjective  Brittney Gill is a 22 y.o. G2P1001 at [redacted]w[redacted]d being seen today for ongoing prenatal care.  She is currently monitored for the following issues for this high-risk pregnancy and has Anxiety; Menorrhagia due to intrauterine device (IUD) (HCC); Morbid obesity (HCC); Depression; Hypovitaminosis D; Supervision of high risk pregnancy, antepartum; Pregnancy occurring while using intrauterine contraceptive device (IUD); Depression with anxiety; Obesity affecting pregnancy; and BMI 45.0-49.9, adult (HCC) on their problem list.  ----------------------------------------------------------------------------------- Patient reports no complaints.    . Vag. Bleeding: None.   . Denies leaking of fluid.  U/S confirms single living IUP with dates consistent with LMP ----------------------------------------------------------------------------------- The following portions of the patient's history were reviewed and updated as appropriate: allergies, current medications, past family history, past medical history, past social history, past surgical history and problem list. Problem list updated.   Objective  Blood pressure 122/78, weight 286 lb (129.7 kg), last menstrual period 12/25/2017, unknown if currently breastfeeding. Pregravid weight 284 lb (128.8 kg) Total Weight Gain 2 lb (0.907 kg) Urinalysis: Urine Protein    Urine Glucose    Fetal Status: Fetal Heart Rate (bpm): present         General:  Alert, oriented and cooperative. Patient is in no acute distress.  Skin: Skin is warm and dry. No rash noted.   Cardiovascular: Normal heart rate noted  Respiratory: Normal respiratory effort, no problems with respiration noted  Abdomen: Soft, gravid, appropriate for gestational age.       Pelvic:  Cervical exam deferred        Extremities: Normal range of motion.     Mental Status: Normal mood and affect. Normal behavior. Normal judgment and thought content.   Imaging  Results: US Ob Transvaginal  Result Date: 02/10/2018 Patient Name: Brittney Gill DOB: 1995/12/19 MRN: [id number] ULTRASOUND REPORT Location: [facility] Date of Service: 02/10/2018 Indications:dating Findings: Mason Jim intrauterine pregnancy is visualized with a CRL consistent with [redacted]w[redacted]d gestation, giving an (U/S) EDD of 10/02/2018. The (U/S) EDD is consistent with the clinically established EDD of 10/01/2018. FHR: 130 BPM CRL measurement: 7.1 mm Yolk sac is visualized and appears normal. Amnion: visualized and appears normal Right Ovary is normal in appearance. Left Ovary is normal appearance. Corpus luteal cyst:  Right ovary Survey of the adnexa demonstrates no adnexal masses. There is no free peritoneal fluid in the cul de sac. Impression: 1. [redacted]w[redacted]d Viable Singleton Intrauterine pregnancy by U/S. 2. (U/S) EDD is consistent with Clinically established EDD of 10/01/2018. Darlina Guys, RDMS RVT There is a viable singleton gestation.  Detailed evaluation of the fetal anatomy is precluded by early gestational age.  It must be noted that a normal ultrasound particular at this early gestational age is unable to rule out fetal aneuploidy, risk of first trimester miscarriage, or anatomic birth defects. Thomasene Mohair, MD, [facility], [facility] 02/10/2018 4:08 PM      Assessment   22 y.o. G2P1001 at [redacted]w[redacted]d by  10/01/2018, by Last Menstrual Period presenting for routine prenatal visit  Plan   pregnancy Problems (from 02/02/18 to present)    Problem Noted Resolved   Depression with anxiety 02/10/2018 by Conard Novak, MD No   Obesity affecting pregnancy 02/10/2018 by Conard Novak, MD No   BMI 45.0-49.9, adult [facility]) 02/10/2018 by Conard Novak, MD No   Supervision of high risk pregnancy, antepartum 02/02/2018 by Conard Novak, MD No   Pregnancy occurring while using intrauterine contraceptive  device (IUD) 02/02/2018 by [name], Elek Holderness D, MD No       Preterm  labor symptoms and general obstetric precautions including but not limited to vaginal bleeding, contractions, leaking of fluid and fetal movement were reviewed in detail with the patient. Please refer to After Visit Summary for other counseling recommendations.   - 1h gtt, TSH/FT4, NIPT next visit  Return in about 4 weeks (around 03/10/2018) for Routine Prenatal Appointment, labs and  1h gtt.  Thomasene MohairStephen Sheriden Archibeque, MD, [facility]inda FrederickFACOG [facility], [facility] 02/10/2018 4:21 PM

## 2018-03-12 ENCOUNTER — Other Ambulatory Visit: Payer: 59

## 2018-03-12 ENCOUNTER — Ambulatory Visit (INDEPENDENT_AMBULATORY_CARE_PROVIDER_SITE_OTHER): Payer: 59 | Admitting: Obstetrics and Gynecology

## 2018-03-12 ENCOUNTER — Encounter: Payer: Self-pay | Admitting: Obstetrics and Gynecology

## 2018-03-12 VITALS — BP 126/84 | Wt 283.0 lb

## 2018-03-12 DIAGNOSIS — O0990 Supervision of high risk pregnancy, unspecified, unspecified trimester: Secondary | ICD-10-CM

## 2018-03-12 DIAGNOSIS — F418 Other specified anxiety disorders: Secondary | ICD-10-CM | POA: Diagnosis not present

## 2018-03-12 DIAGNOSIS — O99341 Other mental disorders complicating pregnancy, first trimester: Secondary | ICD-10-CM | POA: Diagnosis not present

## 2018-03-12 DIAGNOSIS — Z3A11 11 weeks gestation of pregnancy: Secondary | ICD-10-CM

## 2018-03-12 DIAGNOSIS — O99211 Obesity complicating pregnancy, first trimester: Secondary | ICD-10-CM | POA: Diagnosis not present

## 2018-03-12 DIAGNOSIS — Z1379 Encounter for other screening for genetic and chromosomal anomalies: Secondary | ICD-10-CM

## 2018-03-12 DIAGNOSIS — Z131 Encounter for screening for diabetes mellitus: Secondary | ICD-10-CM

## 2018-03-12 DIAGNOSIS — Z6841 Body Mass Index (BMI) 40.0 and over, adult: Secondary | ICD-10-CM

## 2018-03-12 NOTE — Progress Notes (Signed)
  Routine Prenatal Care Visit  Subjective  Brittney Gill is a 22 y.o. G2P1001 at 7538w0d being seen today for ongoing prenatal care.  She is currently monitored for the following issues for this high-risk pregnancy and has Anxiety; Menorrhagia due to intrauterine device (IUD) (HCC); Morbid obesity (HCC); Depression; Hypovitaminosis D; Supervision of high risk pregnancy, antepartum; Pregnancy occurring while using intrauterine contraceptive device (IUD); Depression with anxiety; Obesity affecting pregnancy; and BMI 45.0-49.9, adult (HCC) on their problem list.  ----------------------------------------------------------------------------------- Patient reports no complaints.    . Vag. Bleeding: None.   . Denies leaking of fluid.  ----------------------------------------------------------------------------------- The following portions of the patient's history were reviewed and updated as appropriate: allergies, current medications, past family history, past medical history, past social history, past surgical history and problem list. Problem list updated.   Objective  Blood pressure 126/84, weight 283 lb (128.4 kg), last menstrual period 12/25/2017, unknown if currently breastfeeding. Pregravid weight 284 lb (128.8 kg) Total Weight Gain -1 lb (-0.454 kg) Urinalysis: Urine Protein    Urine Glucose    Fetal Status: Fetal Heart Rate (bpm): 155         General:  Alert, oriented and cooperative. Patient is in no acute distress.  Skin: Skin is warm and dry. No rash noted.   Cardiovascular: Normal heart rate noted  Respiratory: Normal respiratory effort, no problems with respiration noted  Abdomen: Soft, gravid, appropriate for gestational age. Pain/Pressure: Absent     Pelvic:  Cervical exam deferred        Extremities: Normal range of motion.  Edema: None  Mental Status: Normal mood and affect. Normal behavior. Normal judgment and thought content.   Assessment   22 y.o. G2P1001 at 6238w0d by   10/01/2018, by Last Menstrual Period presenting for routine prenatal visit  Plan   pregnancy Problems (from 02/02/18 to present)    Problem Noted Resolved   Depression with anxiety 02/10/2018 by [name], Charli Liberatore D, MD No   Obesity affecting pregnancy 02/10/2018 by [name], Dirk Vanaman D, MD No   BMI 45.0-49.9, adult [facility](HCC) 02/10/2018 by [name], Saydie Gerdts D, MD No   Supervision of high risk pregnancy, antepartum 02/02/2018 by [name], Maika Mcelveen D, MD No   Pregnancy occurring while using intrauterine contraceptive device (IUD) 02/02/2018 by [name], Colleen Donahoe D, MD No       Preterm labor symptoms and general obstetric precautions including but not limited to vaginal bleeding, contractions, leaking of fluid and fetal movement were reviewed in detail with the patient. Please refer to After Visit Summary for other counseling recommendations.   - Thyroid labs today - 1h gtt today - NIPT today - start bASA after 12 weeks Keep follow up in 4 weeks  Return in about 4 weeks (around 04/09/2018) for Routine Prenatal Appointment.  Thomasene MohairStephen Ashia Dehner, MD, Merlinda FrederickFACOG [facility], [facility] 03/12/2018 9:22 AM

## 2018-03-13 ENCOUNTER — Other Ambulatory Visit: Payer: Self-pay | Admitting: Obstetrics and Gynecology

## 2018-03-13 DIAGNOSIS — O99810 Abnormal glucose complicating pregnancy: Secondary | ICD-10-CM | POA: Insufficient documentation

## 2018-03-13 DIAGNOSIS — O0990 Supervision of high risk pregnancy, unspecified, unspecified trimester: Secondary | ICD-10-CM

## 2018-03-13 LAB — TSH+FREE T4
Free T4: 1.03 ng/dL (ref 0.82–1.77)
TSH: 2.06 u[IU]/mL (ref 0.450–4.500)

## 2018-03-13 LAB — GLUCOSE, 1 HOUR GESTATIONAL: Gestational Diabetes Screen: 149 mg/dL — ABNORMAL HIGH (ref 65–139)

## 2018-03-13 NOTE — Progress Notes (Signed)
Would you mind letting Brittney Gill know she failed her 1h gtt and needs a 3 hour? I'll put in the order. Thank you!

## 2018-03-13 NOTE — Progress Notes (Signed)
Nevermind. I sent her a message.

## 2018-03-17 LAB — MATERNIT 21 PLUS CORE, BLOOD
Chromosome 13: NEGATIVE
Chromosome 18: NEGATIVE
Chromosome 21: NEGATIVE
Fetal Fraction: 6
Y Chromosome: NOT DETECTED

## 2018-03-19 ENCOUNTER — Other Ambulatory Visit: Payer: 59

## 2018-03-19 DIAGNOSIS — O99810 Abnormal glucose complicating pregnancy: Secondary | ICD-10-CM | POA: Diagnosis not present

## 2018-03-19 DIAGNOSIS — O0990 Supervision of high risk pregnancy, unspecified, unspecified trimester: Secondary | ICD-10-CM | POA: Diagnosis not present

## 2018-03-20 LAB — GESTATIONAL GLUCOSE TOLERANCE
Glucose, Fasting: 86 mg/dL (ref 65–94)
Glucose, GTT - 1 Hour: 173 mg/dL (ref 65–179)
Glucose, GTT - 2 Hour: 130 mg/dL (ref 65–154)
Glucose, GTT - 3 Hour: 98 mg/dL (ref 65–139)

## 2018-03-25 NOTE — L&D Delivery Note (Signed)
Delivery Note At 12:13 PM a viable female was delivered via Vaginal, Spontaneous (Presentation: ROA).  APGAR: 9, ; weight  pending.   Placenta status: delivered spontaneously, intact.  Cord: 3VC with the following complications: none.  Cord pH: n/a  Anesthesia:  epidural Episiotomy: None Lacerations: 1st degree;Vaginal Suture Repair: laceration hemostatic with pressure Est. Blood Loss (mL): 500  Mom to postpartum.  Baby to Couplet care / Skin to Skin.  Called to see patient.  Mom pushed to deliver a viable female infant.  The head followed by shoulders, which delivered without difficulty, and the rest of the body.  No nuchal cord noted.  Baby to mom's chest.  Cord clamped and cut after > 1 min delay.  No cord blood obtained.  Placenta delivered spontaneously, intact, with a 3-vessel cord.  First degree perineal laceration hemostatic without repair.  All counts correct.  Hemostasis obtained with IV pitocin, misoprostol 800 mcg PR, and methergine 0.2 mg IM and fundal massage. QBL 500 mL.     Prentice Docker 09/22/2018, 12:35 PM

## 2018-04-09 ENCOUNTER — Encounter: Payer: 59 | Admitting: Maternal Newborn

## 2018-04-13 ENCOUNTER — Encounter: Payer: Self-pay | Admitting: Obstetrics and Gynecology

## 2018-04-13 ENCOUNTER — Ambulatory Visit (INDEPENDENT_AMBULATORY_CARE_PROVIDER_SITE_OTHER): Payer: No Typology Code available for payment source | Admitting: Obstetrics and Gynecology

## 2018-04-13 VITALS — BP 128/86 | Wt 283.0 lb

## 2018-04-13 DIAGNOSIS — O09891 Supervision of other high risk pregnancies, first trimester: Secondary | ICD-10-CM

## 2018-04-13 DIAGNOSIS — O99810 Abnormal glucose complicating pregnancy: Secondary | ICD-10-CM

## 2018-04-13 DIAGNOSIS — Z3A15 15 weeks gestation of pregnancy: Secondary | ICD-10-CM

## 2018-04-13 DIAGNOSIS — O0990 Supervision of high risk pregnancy, unspecified, unspecified trimester: Secondary | ICD-10-CM

## 2018-04-13 DIAGNOSIS — O99342 Other mental disorders complicating pregnancy, second trimester: Secondary | ICD-10-CM

## 2018-04-13 DIAGNOSIS — Z6841 Body Mass Index (BMI) 40.0 and over, adult: Secondary | ICD-10-CM

## 2018-04-13 DIAGNOSIS — O99212 Obesity complicating pregnancy, second trimester: Secondary | ICD-10-CM

## 2018-04-13 DIAGNOSIS — F418 Other specified anxiety disorders: Secondary | ICD-10-CM

## 2018-04-13 MED ORDER — BUPROPION HCL ER (XL) 150 MG PO TB24: 150.0000 mg | ORAL_TABLET | Freq: Every day | ORAL | 0 refills | Status: DC

## 2018-04-13 MED ORDER — SERTRALINE HCL 50 MG PO TABS: 50.0000 mg | ORAL_TABLET | Freq: Every day | ORAL | 0 refills | Status: DC

## 2018-04-13 NOTE — Progress Notes (Signed)
  Routine Prenatal Care Visit  Subjective  Brittney Gill is a 23 y.o. G2P1001 at [redacted]w[redacted]d being seen today for ongoing prenatal care.  She is currently monitored for the following issues for this high-risk pregnancy and has Anxiety; Menorrhagia due to intrauterine device (IUD) (HCC); Morbid obesity (HCC); Depression; Hypovitaminosis D; Supervision of high risk pregnancy, antepartum; Pregnancy occurring while using intrauterine contraceptive device (IUD); Depression with anxiety; Obesity affecting pregnancy; BMI 45.0-49.9, adult (HCC); and Abnormal glucose tolerance test in pregnancy on their problem list.  ----------------------------------------------------------------------------------- Patient reports anxiety and depression.  Denies SI/HI.    . Vag. Bleeding: None.   . Denies leaking of fluid.  EPDS: 18 GAD 7: 15 ----------------------------------------------------------------------------------- The following portions of the patient's history were reviewed and updated as appropriate: allergies, current medications, past family history, past medical history, past social history, past surgical history and problem list. Problem list updated.  Objective  Blood pressure 128/86, weight 283 lb (128.4 kg), last menstrual period 12/25/2017, unknown if currently breastfeeding. Pregravid weight 284 lb (128.8 kg) Total Weight Gain -1 lb (-0.454 kg) Urinalysis: Urine Protein    Urine Glucose    Fetal Status: Fetal Heart Rate (bpm): 150         General:  Alert, oriented and cooperative. Patient is in no acute distress.  Skin: Skin is warm and dry. No rash noted.   Cardiovascular: Normal heart rate noted  Respiratory: Normal respiratory effort, no problems with respiration noted  Abdomen: Soft, gravid, appropriate for gestational age. Pain/Pressure: Present     Pelvic:  Cervical exam deferred        Extremities: Normal range of motion.     Mental Status: Normal mood and affect. Normal behavior. Normal  judgment and thought content.   Assessment   23 y.o. G2P1001 at [redacted]w[redacted]d by  10/01/2018, by Last Menstrual Period presenting for routine prenatal visit  Plan   pregnancy Problems (from 02/02/18 to present)    Problem Noted Resolved   Abnormal glucose tolerance test in pregnancy 03/13/2018 by Conard Novak, MD No   Depression with anxiety 02/10/2018 by Conard Novak, MD No   Obesity affecting pregnancy 02/10/2018 by Conard Novak, MD No   BMI 45.0-49.9, adult [facility]) 02/10/2018 by Conard Novak, MD No   Supervision of high risk pregnancy, antepartum 02/02/2018 by Conard Novak, MD No   Pregnancy occurring while using intrauterine contraceptive device (IUD) 02/02/2018 by Conard Novak, MD No       Preterm labor symptoms and general obstetric precautions including but not limited to vaginal bleeding, contractions, leaking of fluid and fetal movement were reviewed in detail with the patient. Please refer to After Visit Summary for other counseling recommendations.   - Discussed pelvic pressure. Better today. No concerning symptoms (vaginal, urinary). No vaginal bleeding. Continue to monitor. - anxiety/depression: patient on both Celexa and Wellbutrin (high dose) in the past.  Will restart at low dose both of these medications.  Precautions for worsening symptoms (including SI/HI) and instructions to go to the ER should these symptoms occur.    Return in about 3 weeks (around 05/04/2018) for Anatomy U/S and routine prenatal.  Thomasene Mohair, MD, [facility], [facility] 04/13/2018 4:27 PM

## 2018-04-16 ENCOUNTER — Ambulatory Visit: Payer: 59 | Admitting: Obstetrics and Gynecology

## 2018-05-13 ENCOUNTER — Other Ambulatory Visit: Payer: Self-pay | Admitting: Obstetrics and Gynecology

## 2018-05-13 DIAGNOSIS — F418 Other specified anxiety disorders: Secondary | ICD-10-CM

## 2018-05-13 DIAGNOSIS — O0990 Supervision of high risk pregnancy, unspecified, unspecified trimester: Secondary | ICD-10-CM

## 2018-05-13 MED ORDER — SERTRALINE HCL 50 MG PO TABS: 50.0000 mg | ORAL_TABLET | Freq: Every day | ORAL | 0 refills | Status: DC

## 2018-05-13 MED ORDER — BUPROPION HCL ER (XL) 150 MG PO TB24: 150.0000 mg | ORAL_TABLET | Freq: Every day | ORAL | 0 refills | Status: DC

## 2018-05-18 ENCOUNTER — Ambulatory Visit (INDEPENDENT_AMBULATORY_CARE_PROVIDER_SITE_OTHER): Payer: No Typology Code available for payment source

## 2018-05-18 ENCOUNTER — Encounter: Payer: Self-pay | Admitting: Advanced Practice Midwife

## 2018-05-18 ENCOUNTER — Ambulatory Visit (INDEPENDENT_AMBULATORY_CARE_PROVIDER_SITE_OTHER): Payer: No Typology Code available for payment source | Admitting: Advanced Practice Midwife

## 2018-05-18 VITALS — BP 124/78 | Wt 278.0 lb

## 2018-05-18 DIAGNOSIS — Z3A20 20 weeks gestation of pregnancy: Secondary | ICD-10-CM

## 2018-05-18 DIAGNOSIS — O99342 Other mental disorders complicating pregnancy, second trimester: Secondary | ICD-10-CM

## 2018-05-18 DIAGNOSIS — F418 Other specified anxiety disorders: Secondary | ICD-10-CM

## 2018-05-18 DIAGNOSIS — Z363 Encounter for antenatal screening for malformations: Secondary | ICD-10-CM

## 2018-05-18 DIAGNOSIS — O0990 Supervision of high risk pregnancy, unspecified, unspecified trimester: Secondary | ICD-10-CM

## 2018-05-18 LAB — POCT URINALYSIS DIPSTICK OB
Glucose, UA: NEGATIVE
POC,PROTEIN,UA: NEGATIVE

## 2018-05-18 NOTE — Progress Notes (Addendum)
  Routine Prenatal Care Visit  Subjective  Brittney Gill is a 23 y.o. G2P1001 at [redacted]w[redacted]d being seen today for ongoing prenatal care.  She is currently monitored for the following issues for this high-risk pregnancy and has Anxiety; Menorrhagia due to intrauterine device (IUD) (HCC); Morbid obesity (HCC); Depression; Hypovitaminosis D; Supervision of high risk pregnancy, antepartum; Pregnancy occurring while using intrauterine contraceptive device (IUD); Depression with anxiety; Obesity affecting pregnancy; BMI 45.0-49.9, adult (HCC); and Abnormal glucose tolerance test in pregnancy on their problem list.  ----------------------------------------------------------------------------------- Patient reports bleeding hemorrhoids with BM.    . Vag. Bleeding: None.  Movement: Present. Denies leaking of fluid.  ----------------------------------------------------------------------------------- The following portions of the patient's history were reviewed and updated as appropriate: allergies, current medications, past family history, past medical history, past social history, past surgical history and problem list. Problem list updated.   Objective  Blood pressure 124/78, weight 278 lb (126.1 kg), last menstrual period 12/25/2017 Pregravid weight 284 lb (128.8 kg) Total Weight Gain -6 lb (-2.722 kg) Urinalysis: Urine Protein    Urine Glucose    Fetal Status: Fetal Heart Rate (bpm): 157   Movement: Present     Anatomy scan: incomplete for cardiac, face, diaphragm, low lying placenta 1.37 cm from cervix  General:  Alert, oriented and cooperative. Patient is in no acute distress.  Skin: Skin is warm and dry. No rash noted.   Cardiovascular: Normal heart rate noted  Respiratory: Normal respiratory effort, no problems with respiration noted  Abdomen: Soft, gravid, appropriate for gestational age. Pain/Pressure: Absent     Pelvic:  Cervical exam deferred        Extremities: Normal range of motion.       Mental Status: Normal mood and affect. Normal behavior. Normal judgment and thought content.   Assessment   23 y.o. G2P1001 at [redacted]w[redacted]d by  10/01/2018, by Last Menstrual Period presenting for routine prenatal visit  Plan   pregnancy Problems (from 02/02/18 to present)    Problem Noted Resolved   Abnormal glucose tolerance test in pregnancy 03/13/2018 by Conard Novak, MD No   Depression with anxiety 02/10/2018 by Conard Novak, MD No   Obesity affecting pregnancy 02/10/2018 by Conard Novak, MD No   BMI 45.0-49.9, adult [facility]) 02/10/2018 by Conard Novak, MD No   Supervision of high risk pregnancy, antepartum 02/02/2018 by Conard Novak, MD No   Pregnancy occurring while using intrauterine contraceptive device (IUD) 02/02/2018 by Conard Novak, MD No       Preterm labor symptoms and general obstetric precautions including but not limited to vaginal bleeding, contractions, leaking of fluid and fetal movement were reviewed in detail with the patient. OTC hemorrhoid remedies Increase hydration, fiber, exercise Use stool softener as needed Recheck placentation at 32 weeks Refills ordered Wellbutrin and Zoloft  Return in about 4 weeks (around 06/15/2018) for follow up anatomy and rob.  [name], CNM 05/18/2018 5:08 PM

## 2018-05-18 NOTE — Progress Notes (Signed)
U/s today. No vb. No lof.  

## 2018-05-19 ENCOUNTER — Telehealth: Payer: Self-pay

## 2018-05-19 MED ORDER — SERTRALINE HCL 50 MG PO TABS: 50.0000 mg | ORAL_TABLET | Freq: Every day | ORAL | 6 refills | Status: DC

## 2018-05-19 MED ORDER — BUPROPION HCL ER (XL) 150 MG PO TB24: 150.0000 mg | ORAL_TABLET | Freq: Every day | ORAL | 6 refills | Status: DC

## 2018-05-19 NOTE — Addendum Note (Signed)
Addended by: [name] on: 05/19/2018 08:28 AM   Modules accepted: Orders

## 2018-05-19 NOTE — Telephone Encounter (Signed)
FMLA/DISABILITY form for [employer] filled out, signature obtained and given to [place] for processing.

## 2018-05-19 NOTE — Addendum Note (Signed)
Addended by: [name] on: 05/19/2018 01:12 PM   Modules accepted: Orders

## 2018-05-27 ENCOUNTER — Other Ambulatory Visit: Payer: Self-pay

## 2018-05-27 ENCOUNTER — Observation Stay
Admission: EM | Admit: 2018-05-27 | Discharge: 2018-05-27 | Disposition: A | Discharge status: Home / Self Care | Payer: Managed Care, Other (non HMO) | Attending: Obstetrics & Gynecology | Admitting: Obstetrics & Gynecology

## 2018-05-27 DIAGNOSIS — Z888 Allergy status to other drugs, medicaments and biological substances status: Secondary | ICD-10-CM | POA: Diagnosis not present

## 2018-05-27 DIAGNOSIS — Z7982 Long term (current) use of aspirin: Secondary | ICD-10-CM | POA: Diagnosis not present

## 2018-05-27 DIAGNOSIS — O26892 Other specified pregnancy related conditions, second trimester: Secondary | ICD-10-CM | POA: Insufficient documentation

## 2018-05-27 DIAGNOSIS — O99212 Obesity complicating pregnancy, second trimester: Secondary | ICD-10-CM

## 2018-05-27 DIAGNOSIS — O09891 Supervision of other high risk pregnancies, first trimester: Secondary | ICD-10-CM

## 2018-05-27 DIAGNOSIS — F418 Other specified anxiety disorders: Secondary | ICD-10-CM

## 2018-05-27 DIAGNOSIS — R1030 Lower abdominal pain, unspecified: Secondary | ICD-10-CM | POA: Diagnosis present

## 2018-05-27 DIAGNOSIS — Z975 Presence of (intrauterine) contraceptive device: Secondary | ICD-10-CM | POA: Diagnosis not present

## 2018-05-27 DIAGNOSIS — R109 Unspecified abdominal pain: Secondary | ICD-10-CM | POA: Diagnosis not present

## 2018-05-27 DIAGNOSIS — F17210 Nicotine dependence, cigarettes, uncomplicated: Secondary | ICD-10-CM | POA: Diagnosis not present

## 2018-05-27 DIAGNOSIS — O368120 Decreased fetal movements, second trimester, not applicable or unspecified: Secondary | ICD-10-CM | POA: Insufficient documentation

## 2018-05-27 DIAGNOSIS — Z6841 Body Mass Index (BMI) 40.0 and over, adult: Secondary | ICD-10-CM

## 2018-05-27 DIAGNOSIS — O99810 Abnormal glucose complicating pregnancy: Secondary | ICD-10-CM

## 2018-05-27 DIAGNOSIS — O0990 Supervision of high risk pregnancy, unspecified, unspecified trimester: Secondary | ICD-10-CM

## 2018-05-27 DIAGNOSIS — Z3A21 21 weeks gestation of pregnancy: Secondary | ICD-10-CM

## 2018-05-27 LAB — URINALYSIS, COMPLETE (UACMP) WITH MICROSCOPIC
Bilirubin Urine: NEGATIVE
Glucose, UA: NEGATIVE mg/dL
Hgb urine dipstick: NEGATIVE
Ketones, ur: NEGATIVE mg/dL
Leukocytes,Ua: NEGATIVE
Nitrite: NEGATIVE
Protein, ur: NEGATIVE mg/dL
Specific Gravity, Urine: 1.011 (ref 1.005–1.030)
pH: 7 (ref 5.0–8.0)

## 2018-05-27 NOTE — Addendum Note (Signed)
Addended by: [name] on: 05/27/2018 01:49 PM   Modules accepted: Orders

## 2018-05-27 NOTE — Final Progress Note (Signed)
Physician Final Progress Note  Patient ID: Brittney Gill MRN: [id number] DOB/AGE: 11/01/1995 23 y.o.  Admit date: 05/27/2018 Admitting provider: Nadara Mustard, MD Discharge date: 05/27/2018  Admission Diagnoses: Abdominal pain in pregnancy, decreased fetal movement  Discharge Diagnoses:  Uterine irritability  History of Present Illness: The patient is a 23 y.o. female G2P1001 at [redacted]w[redacted]d who presents for lower abdominal pain since yesterday and decreased fetal movement. The pain is intermittent and mild. She has not taken any medications. She denies vaginal bleeding and loss of fluid.  After fetal monitors were applied, patient could feel good fetal movement in triage.  Review of Systems: Review of systems negative unless otherwise noted in HPI.   Past Medical History:  Diagnosis Date  . Anxiety   . Depression     Past Surgical History:  Procedure Laterality Date  . FRACTURE SURGERY     Left  . WISDOM TOOTH EXTRACTION      No current facility-administered medications on file prior to encounter.    Current Outpatient Medications on File Prior to Encounter  Medication Sig Dispense Refill  . aspirin EC 81 MG tablet Take by mouth.    Marland Kitchen buPROPion (WELLBUTRIN XL) 150 MG 24 hr tablet Take 1 tablet (150 mg total) by mouth daily. 30 tablet 6  . ciprofloxacin (CIPRO) 500 MG tablet Take 500 mg by mouth 2 (two) times daily.  0  . famotidine (PEPCID) 20 MG tablet Take 20 mg by mouth 2 (two) times daily.    . Prenatal Vit-Fe Fumarate-FA (PRENATAL VITAMIN PO) Take by mouth.    . sertraline (ZOLOFT) 50 MG tablet Take 1 tablet (50 mg total) by mouth daily. 30 tablet 6  . citalopram (CELEXA) 10 MG tablet Take 1 tablet (10 mg total) by mouth daily. (Patient not taking: Reported on 02/02/2018) 30 tablet 3  . levonorgestrel (MIRENA) 20 MCG/24HR IUD 1 each by Intrauterine route once.    Marland Kitchen omeprazole (PRILOSEC) 10 MG capsule Take 10 mg by mouth daily.    . vitamin C (ASCORBIC ACID) 500 MG tablet Take  500 mg by mouth daily.    . Vitamin D, Ergocalciferol, (DRISDOL) 50000 units CAPS capsule Take 1 capsule (50,000 Units total) by mouth every 7 (seven) days. (Patient not taking: Reported on 02/02/2018) 5 capsule 0    Allergies  Allergen Reactions  . Other Itching    Vitamin E cream  . Diflucan [Fluconazole] Rash    Social History   Socioeconomic History  . Marital status: Single    Spouse name: Not on file  . Number of children: Not on file  . Years of education: Not on file  . Highest education level: Not on file  Occupational History  . Not on file  Social Needs  . Financial resource strain: Not on file  . Food insecurity:    Worry: Not on file    Inability: Not on file  . Transportation needs:    Medical: Not on file    Non-medical: Not on file  Tobacco Use  . Smoking status: Current Every Day Smoker    Packs/day: 0.20    Types: Cigarettes    Last attempt to quit: 04/26/2015    Years since quitting: 3.0  . Smokeless tobacco: Never Used  Substance and Sexual Activity  . Alcohol use: No  . Drug use: No  . Sexual activity: Not Currently    Birth control/protection: I.U.D.  Lifestyle  . Physical activity:    Days per week: Not  on file    Minutes per session: Not on file  . Stress: Not on file  Relationships  . Social connections:    Talks on phone: Not on file    Gets together: Not on file    Attends religious service: Not on file    Active member of club or organization: Not on file    Attends meetings of clubs or organizations: Not on file    Relationship status: Not on file  . Intimate partner violence:    Fear of current or ex partner: Not on file    Emotionally abused: Not on file    Physically abused: Not on file    Forced sexual activity: Not on file  Other Topics Concern  . Not on file  Social History Narrative  . Not on file   Family history:  Negative/unremarkable except as detailed in HPI. No family history of birth defects.  Physical  Exam: BP (!) 123/57 (BP Location: Left Arm)   Pulse 87   Temp 98.3 F (36.8 C) (Oral)   Resp 16   Ht 5\' 6"  (1.676 m)   Wt 126.1 kg   LMP 12/25/2017   SpO2 99%   BMI 44.87 kg/m   Gen: NAD CV: Regular rate Pulm: No increased work of breathing Abdomen: soft, gravid, non-tender Pelvic: deferred Neuro: Alert and oriented  FHT: 150s-160s Toco: mostly quite with occasional uterine irritability  Significant Findings/ Diagnostic Studies: labs: UA negative for signs of urinary tract infection  Discharge Condition: good  Disposition: Discharge disposition: 01-Home or Self Care     Diet: Regular diet  Discharge Activity: Activity as tolerated  Discharge Instructions    Discharge activity:  No Restrictions   Complete by:  As directed    Discharge diet:  No restrictions   Complete by:  As directed    No sexual activity restrictions   Complete by:  As directed    Notify physician for a general feeling that "something is not right"   Complete by:  As directed    Notify physician for increase or change in vaginal discharge   Complete by:  As directed    Notify physician for intestinal cramps, with or without diarrhea, sometimes described as "gas pain"   Complete by:  As directed    Notify physician for leaking of fluid   Complete by:  As directed    Notify physician for low, dull backache, unrelieved by heat or Tylenol   Complete by:  As directed    Notify physician for menstrual like cramps   Complete by:  As directed    Notify physician for pelvic pressure   Complete by:  As directed    Notify physician for uterine contractions.  These may be painless and feel like the uterus is tightening or the baby is  "balling up"   Complete by:  As directed    Notify physician for vaginal bleeding   Complete by:  As directed    PRETERM LABOR:  Includes any of the follwing symptoms that occur between 20 - [redacted] weeks gestation.  If these symptoms are not stopped, preterm labor can result  in preterm delivery, placing your baby at risk   Complete by:  As directed      Allergies as of 05/27/2018      Reactions   Other Itching   Vitamin E cream   Diflucan [fluconazole] Rash      Medication List    STOP taking these medications  levonorgestrel 20 MCG/24HR IUD Commonly known as:  MIRENA     TAKE these medications   aspirin EC 81 MG tablet Take by mouth.   buPROPion 150 MG 24 hr tablet Commonly known as:  WELLBUTRIN XL Take 1 tablet (150 mg total) by mouth daily.   ciprofloxacin 500 MG tablet Commonly known as:  CIPRO Take 500 mg by mouth 2 (two) times daily.   citalopram 10 MG tablet Commonly known as:  CELEXA Take 1 tablet (10 mg total) by mouth daily.   famotidine 20 MG tablet Commonly known as:  PEPCID Take 20 mg by mouth 2 (two) times daily.   omeprazole 10 MG capsule Commonly known as:  PRILOSEC Take 10 mg by mouth daily.   PRENATAL VITAMIN PO Take by mouth.   sertraline 50 MG tablet Commonly known as:  ZOLOFT Take 1 tablet (50 mg total) by mouth daily.   vitamin C 500 MG tablet Commonly known as:  ASCORBIC ACID Take 500 mg by mouth daily.   Vitamin D (Ergocalciferol) 1.25 MG (50000 UT) Caps capsule Commonly known as:  DRISDOL Take 1 capsule (50,000 Units total) by mouth every 7 (seven) days.      Reassuring fetal movement in triage and no signs of preterm labor. Discussed comfort measures for cramping such as Tylenol, warm baths. Return precautions emphasized.  Signed: Oswaldo Conroy, CNM  05/27/2018

## 2018-05-27 NOTE — Discharge Summary (Signed)
See Final Progress Note 05/27/2018.

## 2018-05-27 NOTE — Discharge Summary (Signed)
RN reviewed discharge instructions with patient. Gave patient opportunity for questions. All questions answered at this time. Pt verbalized understanding. Pt discharged home with her significant other. 

## 2018-05-27 NOTE — OB Triage Note (Signed)
Pt presents c/o cramping since yesterday and has not been feeling any fetal movement since last night around 7. Denies any bleeding or LOF. Vitals WNL. Will continue to monitor.

## 2018-06-09 ENCOUNTER — Other Ambulatory Visit: Payer: Self-pay | Admitting: Obstetrics and Gynecology

## 2018-06-09 DIAGNOSIS — F331 Major depressive disorder, recurrent, moderate: Secondary | ICD-10-CM

## 2018-06-09 DIAGNOSIS — F418 Other specified anxiety disorders: Secondary | ICD-10-CM

## 2018-06-09 DIAGNOSIS — F419 Anxiety disorder, unspecified: Secondary | ICD-10-CM

## 2018-06-09 DIAGNOSIS — O0990 Supervision of high risk pregnancy, unspecified, unspecified trimester: Secondary | ICD-10-CM

## 2018-06-12 ENCOUNTER — Other Ambulatory Visit: Payer: Self-pay | Admitting: Advanced Practice Midwife

## 2018-06-12 ENCOUNTER — Other Ambulatory Visit: Payer: Self-pay | Admitting: Obstetrics and Gynecology

## 2018-06-12 DIAGNOSIS — O0990 Supervision of high risk pregnancy, unspecified, unspecified trimester: Secondary | ICD-10-CM

## 2018-06-12 DIAGNOSIS — F418 Other specified anxiety disorders: Secondary | ICD-10-CM

## 2018-06-12 MED ORDER — BUPROPION HCL ER (XL) 150 MG PO TB24: 150.0000 mg | ORAL_TABLET | Freq: Every day | ORAL | 6 refills | Status: DC

## 2018-06-12 MED ORDER — SERTRALINE HCL 50 MG PO TABS: 50.0000 mg | ORAL_TABLET | Freq: Every day | ORAL | 6 refills | Status: DC

## 2018-06-12 NOTE — Progress Notes (Signed)
Order placed for follow up ultrasound at [facility]- try to have scan and ROB next Thursday 3/26. Or the following week if needed.

## 2018-06-15 ENCOUNTER — Encounter: Payer: No Typology Code available for payment source | Admitting: Obstetrics and Gynecology

## 2018-06-15 ENCOUNTER — Other Ambulatory Visit: Payer: No Typology Code available for payment source

## 2018-06-18 ENCOUNTER — Ambulatory Visit: Payer: Managed Care, Other (non HMO)

## 2018-06-18 ENCOUNTER — Encounter: Payer: No Typology Code available for payment source | Admitting: Advanced Practice Midwife

## 2018-06-22 ENCOUNTER — Ambulatory Visit (INDEPENDENT_AMBULATORY_CARE_PROVIDER_SITE_OTHER): Payer: Managed Care, Other (non HMO) | Admitting: Obstetrics and Gynecology

## 2018-06-22 ENCOUNTER — Other Ambulatory Visit: Payer: Self-pay

## 2018-06-22 ENCOUNTER — Ambulatory Visit (INDEPENDENT_AMBULATORY_CARE_PROVIDER_SITE_OTHER): Payer: Managed Care, Other (non HMO)

## 2018-06-22 VITALS — BP 120/68 | Wt 280.0 lb

## 2018-06-22 DIAGNOSIS — O09292 Supervision of pregnancy with other poor reproductive or obstetric history, second trimester: Secondary | ICD-10-CM

## 2018-06-22 DIAGNOSIS — O09299 Supervision of pregnancy with other poor reproductive or obstetric history, unspecified trimester: Secondary | ICD-10-CM

## 2018-06-22 DIAGNOSIS — Z3A25 25 weeks gestation of pregnancy: Secondary | ICD-10-CM

## 2018-06-22 DIAGNOSIS — O0990 Supervision of high risk pregnancy, unspecified, unspecified trimester: Secondary | ICD-10-CM

## 2018-06-22 DIAGNOSIS — Z362 Encounter for other antenatal screening follow-up: Secondary | ICD-10-CM | POA: Diagnosis not present

## 2018-06-22 DIAGNOSIS — O99212 Obesity complicating pregnancy, second trimester: Secondary | ICD-10-CM

## 2018-06-22 DIAGNOSIS — O09891 Supervision of other high risk pregnancies, first trimester: Secondary | ICD-10-CM

## 2018-06-22 DIAGNOSIS — O99810 Abnormal glucose complicating pregnancy: Secondary | ICD-10-CM

## 2018-06-22 DIAGNOSIS — O4450 Low lying placenta with hemorrhage, unspecified trimester: Secondary | ICD-10-CM

## 2018-06-22 DIAGNOSIS — O4452 Low lying placenta with hemorrhage, second trimester: Secondary | ICD-10-CM

## 2018-06-22 LAB — POCT URINALYSIS DIPSTICK OB
Glucose, UA: NEGATIVE
POC,PROTEIN,UA: NEGATIVE

## 2018-06-22 NOTE — Progress Notes (Signed)
Routine Prenatal Care Visit  Subjective  Brittney Gill is a 23 y.o. G2P1001 at [redacted]w[redacted]d being seen today for ongoing prenatal care.  She is currently monitored for the following issues for this low-risk pregnancy and has Anxiety; Morbid obesity (HCC); Depression; Hypovitaminosis D; Supervision of high risk pregnancy, antepartum; Pregnancy occurring while using intrauterine contraceptive device (IUD); Depression with anxiety; Obesity affecting pregnancy; BMI 45.0-49.9, adult (HCC); Abnormal glucose tolerance test in pregnancy; and Low lying placenta with hemorrhage, antepartum on their problem list.  ----------------------------------------------------------------------------------- Patient reports no complaints.   Contractions: Not present. Vag. Bleeding: None.  Movement: Present. Denies leaking of fluid.  ----------------------------------------------------------------------------------- The following portions of the patient's history were reviewed and updated as appropriate: allergies, current medications, past family history, past medical history, past social history, past surgical history and problem list. Problem list updated.   Objective  Blood pressure 120/68, weight 280 lb (127 kg), last menstrual period 12/25/2017, unknown if currently breastfeeding. Pregravid weight 284 lb (128.8 kg) Total Weight Gain -4 lb (-1.814 kg) Urinalysis:      Fetal Status: Fetal Heart Rate (bpm): 150   Movement: Present     General:  Alert, oriented and cooperative. Patient is in no acute distress.  Skin: Skin is warm and dry. No rash noted.   Cardiovascular: Normal heart rate noted  Respiratory: Normal respiratory effort, no problems with respiration noted  Abdomen: Soft, gravid, appropriate for gestational age. Pain/Pressure: Present     Pelvic:  Cervical exam deferred        Extremities: Normal range of motion.     ental Status: Normal mood and affect. Normal behavior. Normal judgment and thought  content.   There is no immunization history for the selected administration types on file for this patient.   Assessment   23 y.o. G2P1001 at [redacted]w[redacted]d by  10/01/2018, by Last Menstrual Period presenting for routine prenatal visit  Plan   pregnancy Problems (from 02/02/18 to present)    Problem Noted Resolved   Low lying placenta with hemorrhage, antepartum 06/22/2018 by Vena Austria, MD No   Overview Signed 06/22/2018  8:51 AM by Vena Austria, MD    Resolved 06/22/2018      Abnormal glucose tolerance test in pregnancy 03/13/2018 by Conard Novak, MD No   Overview Signed 06/22/2018 10:00 AM by Vena Austria, MD    Early 1-hr 149 3-hr 86, 173, 130, 98      Depression with anxiety 02/10/2018 by Conard Novak, MD No   Obesity affecting pregnancy 02/10/2018 by Conard Novak, MD No   BMI 45.0-49.9, adult [facility]) 02/10/2018 by Conard Novak, MD No   Supervision of high risk pregnancy, antepartum 02/02/2018 by Conard Novak, MD No   Overview Addendum 06/22/2018 10:03 AM by Vena Austria, MD    Clinic [facility] Prenatal Labs  Dating LMP = 6 week [place] Blood type: B/Positive/-- (11/11 1540)   Genetic Screen NIPS: 86, 173, 130, 98 Antibody:Negative (11/11 1540)  Anatomic [place] Complete on 25 week follow, low lying placenta resolved Rubella: 10.80 (11/11 1540) Varicella: Immune  GTT Early 1-hr 1493-hr 86, 173, 130, 98  Third trimester:  RPR: Non Reactive (11/11 1540)   Rhogam N/A HBsAg: Negative (11/11 1540)   TDaP vaccine                       Flu Shot: HIV: Non Reactive (11/11 1540)   Baby Food  GBS:   Contraception  Pap: 10/13/2017 NIL  CBB     CS/VBAC    Support Person            Pregnancy occurring while using intrauterine contraceptive device (IUD) 02/02/2018 by Conard Novak, MD No      Gestational age appropriate obstetric precautions including but not limited to vaginal bleeding, contractions, leaking of fluid  and fetal movement were reviewed in detail with the patient.    - 3-hr OGTT in 3 weeks - Growth scan in 3 weeks - Low lying placenta resolved - PRENATAL PROBLEM LIST UPDATED - COVID19 information and prenatal scheduled provided  Return in about 3 weeks (around 07/13/2018) for 3 week growth ultrasound and 3-hr OGTT.  Vena Austria, MD, [facility], [facility]

## 2018-06-22 NOTE — Progress Notes (Signed)
ROB Vaginal pressure Ultrasound today

## 2018-06-22 NOTE — Patient Instructions (Signed)
 Hello,  Given the current COVID-19 pandemic, our practice is making changes in how we are providing care to our patients. We are limiting in-person visits for the safety of all of our patients.   As a practice, we have met to discuss the best way to minimize visits, but still provide excellent care to our expecting mothers.  We have decided on the following visit structure for low-risk pregnancies.  Initial Pregnancy visit will be conducted as a telephone or web visit.  Between 10-14 weeks  there will be one in-person visit for an ultrasound, lab work, and genetic screening. 20 weeks in-person visit with an anatomy ultrasound  28 weeks in-person office visit for a 1-hour glucose test and a TDAP vaccination 32 weeks in-person office visit 34 weeks telephone visit 36 weeks in-person office visit for GBS, chlamydia, and gonorrhea testing 38 weeks in-person office visit 40 weeks in-person office visit  Understandably, some patients will require more visits than what is outlined above. Additional visits will be determined on a case-by-case basis.   We will, as always, be available for emergencies or to address concerns that might arise between in-person visits. We ask that you allow us the opportunity to address any concerns over the phone or through a virtual visit first. We will be available to return your phone calls throughout the day.   If you are able to purchase a scale, a blood pressure machine, and a home fetal doppler visits could be limited further. This will help decrease your exposure risks, but these purchases are not a necessity.   Things seem to change daily and there is the possibility that this structure could change, please be patient as we adapt to a new way of caring for patients.   Thank you for trusting us with your prenatal care. Our practice values you and looks forward to providing you with excellent care.   Sincerely,   [facility], [facility]      COVID-19 and Your Pregnancy FAQ  How can I prevent infection with COVID-19 during my pregnancy? Social distancing is key. Please limit any interactions in public. Try and work from home if possible. Frequently wash your hands after touching possibly contaminated surfaces. Avoid touching your face.  Minimize trips to the store. Consider online ordering when possible.   Should I wear a mask? Masks should only be worn by those experiencing symptoms of COVID-19 or those with confirmed COVID-19 when they are in public or around other individuals.  What are the symptoms of COVID-19? Fever (greater than 100.4 F), dry cough, shortness of breath.  Am I more at risk for COVID-19 since I am pregnant? There is not currently data showing that pregnant women are more adversely impacted by COVID-19 than the general population. However, we know that pregnant women tend to have worse respiratory complications from similar diseases such as the flu and SARS and for this reason should be considered an at-risk population.  What do I do if I am experiencing the symptoms of COVID-19? Testing is being limited because of test availability. If you are experiencing symptoms you should quarantine yourself, and the members of your family, for at least 2 weeks at home.   Please visit this website for more information: https://www.cdc.gov/coronavirus/2019-ncov/if-you-are-sick/steps-when-sick.html  When should I go to the Emergency Room? Please go to the emergency room if you are experiencing ANY of these symptoms*:  1.    Difficulty breathing or shortness of breath 2.      Persistent pain or pressure in the chest 3.    Confusion or difficulty being aroused (or awakened) 4.    Bluish lips or face  *This list is not all inclusive. Please consult our office for any other symptoms that are severe or concerning.  What do I do if I am having difficulty breathing? You should go to the Emergency Room for  evaluation. At this time they have a tent set up for evaluating patients with COVID-19 symptoms.   How will my prenatal care be different because of the COVID-19 pandemic? It has been recommended to reduce the frequency of face-to-face visits and use resources such as telephone and virtual visits when possible. Using a scale, blood pressure machine and fetal doppler at home can further help reduce face-to-face visits. You will be provided with additional information on this topic.  We ask that you come to your visits alone to minimize potential exposures to  COVID-19.  How can I receive childbirth education? At this time in-person classes have been cancelled. You can register for online childbirth education, breastfeeding, and newborn care classes.  Please visit:  [email or web address] for more information  How will my hospital birth experience be different? The hospital is currently limiting visitors. This means that while you are in labor you can only have one person at the hospital with you. Additional family members will not be allowed to wait in the building or outside your room. Your one support person can be the father of the baby, a relative, a doula, or a friend. Once one support person is designated that person will wear a band. This band cannot be shared with multiple people.  How long will I stay in the hospital for after giving birth? It is also recommended that discharge home be expedited during the COVID-19 outbreak. This means staying for 1 day after a vaginal delivery and 2 days after a cesarean section.  What if I have COVID-19 and I am in labor? We ask that you wear a mask while on labor and delivery. We will try and accommodate you being placed in a room that is capable of filtering the air. Please call ahead if you are in labor and on your way to the hospital. The phone number for labor and delivery at [facility] is [phone].  If I have  COVID-19 when my baby is born how can I prevent my baby from contracting COVID-19? This is an issue that will have to be discussed on a case-by-case basis. Current recommendations suggest providing separate isolation rooms for both the mother and new infant as well as limiting visitors. However, there are practical challenges to this recommendation. The situation will assuredly change and decisions will be influenced by the desires of the mother and availability of space.  Some suggestions are the use of a curtain or physical barrier between mom and infant, hand hygiene, mom wearing a mask, or 6 feet of spacing between a mom and infant.   Can I breastfeed during the COVID-19 pandemic?   Yes, breastfeeding is encouraged.  Can I breastfeed if I have COVID-19? Yes. Covid-19 has not been found in breast milk. This means you cannot give COVID-19 to your child through breast milk. Breast feeding will also help pass antibodies to fight infection to your baby.   What precautions should I take when breastfeeding if I have COVID-19? If a mother and newborn do room-in and the mother wishes to feed at the breast, she should   facemask and practice hand hygiene before each feeding.  What precautions should I take when pumping if I have COVID-19? Prior to expressing breast milk, mothers should practice hand hygiene. After each pumping session, all parts that come into contact with breast milk should be thoroughly washed and the entire pump should be appropriately disinfected per the manufacturer's instructions. This expressed breast milk should be fed to the newborn by a healthy caregiver.  What if I am pregnant and work in healthcare? Based on limited data regarding COVID-19 and pregnancy, ACOG currently does not propose creating additional restrictions on pregnant health care personnel because of COVID-19 alone. Pregnant women do not appear to be at higher risk of severe disease related to COVID-19. Pregnant health care  personnel should follow CDC risk assessment and infection control guidelines for health care personnel exposed to patients with suspected or confirmed COVID-19. Adherence to recommended infection prevention and control practices is an important part of protecting all health care personnel in health care settings.    Information on COVID-19 in pregnancy is very limited; however, facilities may want to consider limiting exposure of pregnant health care personnel to patients with confirmed or suspected COVID-19 infection, especially during higher-risk procedures (eg, aerosol-generating procedures), if feasible, based on staffing availability.

## 2018-06-23 NOTE — Telephone Encounter (Signed)
Pt also called nurse c/o BP today 135/74 which is high for her, H/A, and pressure in chest.  Is this normal? What to do?  [phone]

## 2018-06-24 ENCOUNTER — Encounter: Payer: Self-pay | Admitting: Emergency Medicine

## 2018-06-24 ENCOUNTER — Emergency Department
Admission: EM | Admit: 2018-06-24 | Discharge: 2018-06-24 | Disposition: A | Discharge status: Home / Self Care | Payer: Managed Care, Other (non HMO) | Attending: Emergency Medicine | Admitting: Emergency Medicine

## 2018-06-24 ENCOUNTER — Other Ambulatory Visit: Payer: Self-pay

## 2018-06-24 DIAGNOSIS — Z7982 Long term (current) use of aspirin: Secondary | ICD-10-CM | POA: Insufficient documentation

## 2018-06-24 DIAGNOSIS — R079 Chest pain, unspecified: Secondary | ICD-10-CM | POA: Diagnosis not present

## 2018-06-24 DIAGNOSIS — Z79899 Other long term (current) drug therapy: Secondary | ICD-10-CM | POA: Diagnosis not present

## 2018-06-24 DIAGNOSIS — Z87891 Personal history of nicotine dependence: Secondary | ICD-10-CM | POA: Insufficient documentation

## 2018-06-24 LAB — URINALYSIS, COMPLETE (UACMP) WITH MICROSCOPIC
Bacteria, UA: NONE SEEN
Bilirubin Urine: NEGATIVE
Glucose, UA: NEGATIVE mg/dL
Ketones, ur: NEGATIVE mg/dL
Leukocytes,Ua: NEGATIVE
Nitrite: NEGATIVE
Protein, ur: NEGATIVE mg/dL
Specific Gravity, Urine: 1.011 (ref 1.005–1.030)
pH: 7 (ref 5.0–8.0)

## 2018-06-24 LAB — BASIC METABOLIC PANEL
Anion gap: 9 (ref 5–15)
BUN: 6 mg/dL (ref 6–20)
CO2: 24 mmol/L (ref 22–32)
Calcium: 8.7 mg/dL — ABNORMAL LOW (ref 8.9–10.3)
Chloride: 104 mmol/L (ref 98–111)
Creatinine, Ser: 0.42 mg/dL — ABNORMAL LOW (ref 0.44–1.00)
GFR calc Af Amer: >60 mL/min (ref >60)
GFR calc non Af Amer: >60 mL/min (ref >60)
Glucose, Bld: 83 mg/dL (ref 70–99)
Potassium: 3.5 mmol/L (ref 3.5–5.1)
Sodium: 137 mmol/L (ref 135–145)

## 2018-06-24 LAB — CBC
HCT: 32.7 % — ABNORMAL LOW (ref 36.0–46.0)
Hemoglobin: 10.7 g/dL — ABNORMAL LOW (ref 12.0–15.0)
MCH: 28.4 pg (ref 26.0–34.0)
MCHC: 32.7 g/dL (ref 30.0–36.0)
MCV: 86.7 fL (ref 80.0–100.0)
Platelets: 366 10*3/uL (ref 150–400)
RBC: 3.77 MIL/uL — ABNORMAL LOW (ref 3.87–5.11)
RDW: 12.3 % (ref 11.5–15.5)
WBC: 15.7 10*3/uL — ABNORMAL HIGH (ref 4.0–10.5)
nRBC: 0 % (ref 0.0–0.2)

## 2018-06-24 LAB — HEPATIC FUNCTION PANEL
ALT: 11 U/L (ref 0–44)
AST: 13 U/L — ABNORMAL LOW (ref 15–41)
Albumin: 3.1 g/dL — ABNORMAL LOW (ref 3.5–5.0)
Alkaline Phosphatase: 59 U/L (ref 38–126)
Bilirubin, Direct: <0.1 mg/dL (ref 0.0–0.2)
Total Bilirubin: 0.3 mg/dL (ref 0.3–1.2)
Total Protein: 6.8 g/dL (ref 6.5–8.1)

## 2018-06-24 LAB — TROPONIN I: Troponin I: <0.03 ng/mL (ref <0.03)

## 2018-06-24 MED ORDER — SODIUM CHLORIDE 0.9% FLUSH: 3.0000 mL | Freq: Once | INTRAVENOUS | Status: DC

## 2018-06-24 NOTE — ED Notes (Signed)
ED Provider at bedside. 

## 2018-06-24 NOTE — ED Triage Notes (Signed)
Pt via pov from home with chest pain that started yesterday. Pt states that she began having mid chest pressure yesterday. Denies n/v/sob. Pt is [redacted] weeks pregnant and called her OB, who sent her here. Pt alert & oriented with NAD noted.

## 2018-06-24 NOTE — Discharge Instructions (Addendum)
As we discussed please follow up with your ob/gyn doctor to recheck your blood pressure. Please seek medical attention for any high fevers, chest pain, shortness of breath, change in behavior, persistent vomiting, bloody stool or any other new or concerning symptoms.

## 2018-06-24 NOTE — ED Provider Notes (Signed)
[facility] Emergency Department Provider Note   ____________________________________________   I have reviewed the triage vital signs and the nursing notes.   HISTORY  Chief Complaint Chest pressure   History limited by: Not Limited   HPI Brittney Gill is a 23 y.o. female who presents to the emergency department today because of concerns for chest pressure.  The patient states that the chest pressure has been present since yesterday morning when she woke up.  Is located in the center part of her chest.  Has been constant.  She has not noticed anything that makes it better or worse.  She states she has had some shortness of breath with this pregnancy although has not been worse over the past 24 hours.  She denies any extremity swelling.  She denies any nausea or vomiting. Denies any fevers.    Records reviewed. Per medical record review patient has a history of depression, anxiety.  Past Medical History:  Diagnosis Date  . Anxiety   . Depression     Patient Active Problem List   Diagnosis Date Noted  . Low lying placenta with hemorrhage, antepartum 06/22/2018  . Abnormal glucose tolerance test in pregnancy 03/13/2018  . Depression with anxiety 02/10/2018  . Obesity affecting pregnancy 02/10/2018  . BMI 45.0-49.9, adult (HCC) 02/10/2018  . Supervision of high risk pregnancy, antepartum 02/02/2018  . Pregnancy occurring while using intrauterine contraceptive device (IUD) 02/02/2018  . Hypovitaminosis D 10/16/2017  . Depression 10/13/2017  . Anxiety 07/01/2016  . Morbid obesity (HCC) 09/23/2011    Past Surgical History:  Procedure Laterality Date  . FRACTURE SURGERY     Left  . WISDOM TOOTH EXTRACTION      Prior to Admission medications   Medication Sig Start Date End Date Taking? Authorizing Provider  aspirin EC 81 MG tablet Take by mouth.    [provider]  buPROPion (WELLBUTRIN XL) 150 MG 24 hr tablet Take 1 tablet (150 mg total) by  mouth daily. 06/12/18   Conard Novak, MD  citalopram (CELEXA) 10 MG tablet Take 1 tablet (10 mg total) by mouth daily. Patient not taking: Reported on 02/02/2018 01/12/18   Conard Novak, MD  famotidine (PEPCID) 20 MG tablet Take 20 mg by mouth 2 (two) times daily.    [provider]  omeprazole (PRILOSEC) 10 MG capsule Take 10 mg by mouth daily.    [provider]  Prenatal Vit-Fe Fumarate-FA (PRENATAL VITAMIN PO) Take by mouth.    [provider]  sertraline (ZOLOFT) 50 MG tablet Take 1 tablet (50 mg total) by mouth daily. 06/12/18   Conard Novak, MD  vitamin C (ASCORBIC ACID) 500 MG tablet Take 500 mg by mouth daily.    [provider]  Vitamin D, Ergocalciferol, (DRISDOL) 50000 units CAPS capsule Take 1 capsule (50,000 Units total) by mouth every 7 (seven) days. Patient not taking: Reported on 02/02/2018 10/16/17   Conard Novak, MD    Allergies Other and Diflucan [fluconazole]  History reviewed. No pertinent family history.  Social History Social History   Tobacco Use  . Smoking status: Former Smoker    Packs/day: 0.20    Types: Cigarettes    Last attempt to quit: 04/26/2015    Years since quitting: 3.1  . Smokeless tobacco: Never Used  Substance Use Topics  . Alcohol use: No  . Drug use: No    Review of Systems Constitutional: No fever/chills Eyes: No visual changes. ENT: No sore  throat. Cardiovascular: Positive for chest pressure.  Respiratory: Positive for shortness of breath throughout the pregnancy. Gastrointestinal: No abdominal pain.  No nausea, no vomiting.  No diarrhea.   Genitourinary: Negative for dysuria. Musculoskeletal: Negative for back pain. Skin: Negative for rash. Neurological: Negative for headaches, focal weakness or numbness.  ____________________________________________   PHYSICAL EXAM:  VITAL SIGNS: ED Triage Vitals  Enc Vitals Group     BP 06/24/18 1811 (!) 149/82     Pulse Rate  06/24/18 1811 92     Resp 06/24/18 1811 18     Temp 06/24/18 1811 99.5 F (37.5 C)     Temp src --      SpO2 06/24/18 1811 100 %     Weight 06/24/18 1812 280 lb (127 kg)     Height 06/24/18 1812 5\' 5"  (1.651 m)     Head Circumference --      Peak Flow --      Pain Score 06/24/18 1811 4   Constitutional: Alert and oriented.  Eyes: Conjunctivae are normal.  ENT      Head: Normocephalic and atraumatic.      Nose: No congestion/rhinnorhea.      Mouth/Throat: Mucous membranes are moist.      Neck: No stridor. Hematological/Lymphatic/Immunilogical: No cervical lymphadenopathy. Cardiovascular: Normal rate, regular rhythm.  No murmurs, rubs, or gallops.  Respiratory: Normal respiratory effort without tachypnea nor retractions. Breath sounds are clear and equal bilaterally. No wheezes/rales/rhonchi. Gastrointestinal: Soft and non tender. No rebound. No guarding.  Genitourinary: Deferred Musculoskeletal: Normal range of motion in all extremities. No lower extremity edema. Neurologic:  Normal speech and language. No gross focal neurologic deficits are appreciated.  Skin:  Skin is warm, dry and intact. No rash noted. Psychiatric: Mood and affect are normal. Speech and behavior are normal. Patient exhibits appropriate insight and judgment.  ____________________________________________    LABS (pertinent positives/negatives)  Trop <0.03 BMP wnl except cr 0.42, ca 8.7 UA small hgb dipstick, negative protein CBC wbc 15.7, hgb 10.7, plt 366 LFTs albumin 3.1, ast 13 ____________________________________________   EKG  I, Phineas Semen, attending physician, personally viewed and interpreted this EKG  EKG Time: 1808 Rate: 89 Rhythm: normal sinus rhythm Axis: normal Intervals: qtc 447 QRS: narrow, q waves III, v3 ST changes: no st elevation Impression: abnormal ekg   ____________________________________________     RADIOLOGY  None  ____________________________________________   PROCEDURES  Procedures  ____________________________________________   INITIAL IMPRESSION / ASSESSMENT AND PLAN / ED COURSE  Pertinent labs & imaging results that were available during my care of the patient were reviewed by me and considered in my medical decision making (see chart for details).   Patient presented to the emergency department today because of concerns for chest pressure.  On exam here patient without any significant tenderness in the abdomen.  Lungs were clear.  Blood work with a slight leukocytosis.  At this point unclear etiology however I doubt pneumonia given clear auscultation.  The patient was also noted to be slightly hypertensive.  Patient without any protein in the urine platelets were within normal limits and LFTs essentially without concerning findings.  Did discuss with Dr. Tiburcio Pea.  At this point he recommend patient follow-up in clinic for further blood pressure monitoring.  Discussed this with the patient.  She did have improvement of her symptoms while here in the emergency department.  Will discharge to follow-up with OB/GYN.  ____________________________________________   FINAL CLINICAL IMPRESSION(S) / ED DIAGNOSES  Final diagnoses:  Nonspecific  chest pain     Note: This dictation was prepared with Dragon dictation. Any transcriptional errors that result from this process are unintentional     Phineas Semen, MD 06/24/18 2029

## 2018-06-25 NOTE — Telephone Encounter (Signed)
Can we see her today or tomorrow

## 2018-06-26 ENCOUNTER — Other Ambulatory Visit: Payer: Self-pay

## 2018-06-26 ENCOUNTER — Encounter: Payer: Self-pay | Admitting: Obstetrics & Gynecology

## 2018-06-26 ENCOUNTER — Ambulatory Visit (INDEPENDENT_AMBULATORY_CARE_PROVIDER_SITE_OTHER): Payer: Managed Care, Other (non HMO) | Admitting: Obstetrics & Gynecology

## 2018-06-26 VITALS — BP 120/80 | Ht 65.0 in | Wt 283.0 lb

## 2018-06-26 DIAGNOSIS — O163 Unspecified maternal hypertension, third trimester: Secondary | ICD-10-CM | POA: Insufficient documentation

## 2018-06-26 DIAGNOSIS — O162 Unspecified maternal hypertension, second trimester: Secondary | ICD-10-CM

## 2018-06-26 NOTE — Progress Notes (Signed)
Obstetrics & Gynecology Office Visit   Chief Complaint:  Chief Complaint  Patient presents with  . Blood Pressure Check    History of Present Illness: 23 y.o. G2P1001 being seen for follow up blood pressure check today.  The patient is currently pregnantThe established diagnosis for the patient is obesity; no prior h/o HTN.  She is currently on no antihypertensives.  She reports no current symptoms attributable to her blood pressure.  She had some chest discomfort on 06/24/18 and was seen in ER; labs and exam normal at that time. Medication list reviewed medications which may contribute to BP elevation were not noted.  Past Medical History:  Past Medical History:  Diagnosis Date  . Anxiety   . Depression     Past Surgical History:  Past Surgical History:  Procedure Laterality Date  . FRACTURE SURGERY     Left  . WISDOM TOOTH EXTRACTION      Gynecologic History: Patient's last menstrual period was 12/25/2017.  Obstetric History: G2P1001  Family History:  History reviewed. No pertinent family history.  Social History:  Social History   Socioeconomic History  . Marital status: Single    Spouse name: Not on file  . Number of children: Not on file  . Years of education: Not on file  . Highest education level: Not on file  Occupational History  . Not on file  Social Needs  . Financial resource strain: Not on file  . Food insecurity:    Worry: Not on file    Inability: Not on file  . Transportation needs:    Medical: Not on file    Non-medical: Not on file  Tobacco Use  . Smoking status: Former Smoker    Packs/day: 0.20    Types: Cigarettes    Last attempt to quit: 04/26/2015    Years since quitting: 3.1  . Smokeless tobacco: Never Used  Substance and Sexual Activity  . Alcohol use: No  . Drug use: No  . Sexual activity: Not Currently    Birth control/protection: I.U.D.  Lifestyle  . Physical activity:    Days per week: Not on file    Minutes per  session: Not on file  . Stress: Not on file  Relationships  . Social connections:    Talks on phone: Not on file    Gets together: Not on file    Attends religious service: Not on file    Active member of club or organization: Not on file    Attends meetings of clubs or organizations: Not on file    Relationship status: Not on file  . Intimate partner violence:    Fear of current or ex partner: Not on file    Emotionally abused: Not on file    Physically abused: Not on file    Forced sexual activity: Not on file  Other Topics Concern  . Not on file  Social History Narrative  . Not on file    Allergies:  Allergies  Allergen Reactions  . Other Itching    Vitamin E cream  . Diflucan [Fluconazole] Rash    Medications: Prior to Admission medications   Medication Sig Start Date End Date Taking? Authorizing Provider  aspirin EC 81 MG tablet Take by mouth.   Yes [provider]  buPROPion (WELLBUTRIN XL) 150 MG 24 hr tablet Take 1 tablet (150 mg total) by mouth daily. 06/12/18  Yes Conard Novak, MD  citalopram (CELEXA) 10 MG tablet Take 1  tablet (10 mg total) by mouth daily. 01/12/18  Yes Conard Novak, MD  famotidine (PEPCID) 20 MG tablet Take 20 mg by mouth 2 (two) times daily.   Yes [provider]  omeprazole (PRILOSEC) 10 MG capsule Take 10 mg by mouth daily.   Yes [provider]  Prenatal Vit-Fe Fumarate-FA (PRENATAL VITAMIN PO) Take by mouth.   Yes [provider]  sertraline (ZOLOFT) 50 MG tablet Take 1 tablet (50 mg total) by mouth daily. 06/12/18  Yes Conard Novak, MD  vitamin C (ASCORBIC ACID) 500 MG tablet Take 500 mg by mouth daily.   Yes [provider]  Vitamin D, Ergocalciferol, (DRISDOL) 50000 units CAPS capsule Take 1 capsule (50,000 Units total) by mouth every 7 (seven) days. 10/16/17  Yes Conard Novak, MD    ROS  Physical Exam Blood pressure 120/80, height 5\' 5"  (1.651 m), weight 283 lb (128.4  kg), last menstrual period 12/25/2017, unknown if currently breastfeeding.  Patient's last menstrual period was 12/25/2017.  General: NAD HEENT: normocephalic, anicteric Pulmonary: No increased work of breathing Cardiovascular: RRR, distal pulses 2+ Extremities: noedema, no erythema, no tenderness Neurologic: Grossly intact Psychiatric: mood appropriate, affect full FHT 150s  Assessment: 23 y.o. G2P1001 presenting for blood pressure evaluation today  Plan: Problem List Items Addressed This Visit      Other   Elevated blood pressure affecting pregnancy in second trimester, antepartum - Primary    1) Blood pressure - blood pressure at today's visit is normotensive.  As a result antihypertensive therapy is currently not warranted. - additional blood work was not obtained  - Cont to monitor BP and s/sx preeclampsia - Rest and hydration discussed - Plan [place] and Lab f/u appt as scheduled  Annamarie Major, MD, [facility], [facility] 06/26/2018  10:54 AM

## 2018-07-13 ENCOUNTER — Other Ambulatory Visit: Payer: Managed Care, Other (non HMO)

## 2018-07-13 ENCOUNTER — Ambulatory Visit (INDEPENDENT_AMBULATORY_CARE_PROVIDER_SITE_OTHER): Payer: Managed Care, Other (non HMO) | Admitting: Maternal Newborn

## 2018-07-13 ENCOUNTER — Encounter: Payer: Managed Care, Other (non HMO) | Admitting: Obstetrics and Gynecology

## 2018-07-13 ENCOUNTER — Other Ambulatory Visit: Payer: Self-pay

## 2018-07-13 ENCOUNTER — Encounter: Payer: Self-pay | Admitting: Maternal Newborn

## 2018-07-13 ENCOUNTER — Other Ambulatory Visit: Payer: Self-pay | Admitting: Obstetrics and Gynecology

## 2018-07-13 ENCOUNTER — Ambulatory Visit (INDEPENDENT_AMBULATORY_CARE_PROVIDER_SITE_OTHER): Payer: Managed Care, Other (non HMO)

## 2018-07-13 VITALS — BP 130/76 | Wt 282.0 lb

## 2018-07-13 DIAGNOSIS — O99810 Abnormal glucose complicating pregnancy: Secondary | ICD-10-CM

## 2018-07-13 DIAGNOSIS — O0990 Supervision of high risk pregnancy, unspecified, unspecified trimester: Secondary | ICD-10-CM

## 2018-07-13 DIAGNOSIS — Z362 Encounter for other antenatal screening follow-up: Secondary | ICD-10-CM

## 2018-07-13 DIAGNOSIS — Z3689 Encounter for other specified antenatal screening: Secondary | ICD-10-CM

## 2018-07-13 DIAGNOSIS — O99212 Obesity complicating pregnancy, second trimester: Secondary | ICD-10-CM

## 2018-07-13 DIAGNOSIS — Z23 Encounter for immunization: Secondary | ICD-10-CM

## 2018-07-13 DIAGNOSIS — O4443 Low lying placenta NOS or without hemorrhage, third trimester: Secondary | ICD-10-CM

## 2018-07-13 DIAGNOSIS — Z3A28 28 weeks gestation of pregnancy: Secondary | ICD-10-CM

## 2018-07-13 DIAGNOSIS — O09299 Supervision of pregnancy with other poor reproductive or obstetric history, unspecified trimester: Secondary | ICD-10-CM

## 2018-07-13 DIAGNOSIS — O99213 Obesity complicating pregnancy, third trimester: Secondary | ICD-10-CM

## 2018-07-13 LAB — POCT URINALYSIS DIPSTICK OB
Glucose, UA: NEGATIVE
POC,PROTEIN,UA: NEGATIVE

## 2018-07-13 NOTE — Progress Notes (Signed)
Routine Prenatal Care Visit  Subjective  Brittney Gill is a 23 y.o. G2P1001 at 8363w4d being seen today for ongoing prenatal care.  She is currently monitored for the following issues for this high-risk pregnancy and has Anxiety; Morbid obesity (HCC); Depression; Hypovitaminosis D; Supervision of high risk pregnancy, antepartum; Pregnancy occurring while using intrauterine contraceptive device (IUD); Depression with anxiety; Obesity affecting pregnancy; BMI 45.0-49.9, adult (HCC); Abnormal glucose tolerance test in pregnancy; Low lying placenta with hemorrhage, antepartum; Elevated blood pressure affecting pregnancy in second trimester, antepartum; and Anemia during pregnancy on their problem list.  ----------------------------------------------------------------------------------- Patient reports pelvic girdle and pubic bone pain that began on Saturday. Has improved since then but still present at times.  Contractions: Not present. Vag. Bleeding: None.  Movement: Present. No leaking of fluid.  ----------------------------------------------------------------------------------- The following portions of the patient's history were reviewed and updated as appropriate: allergies, current medications, past family history, past medical history, past social history, past surgical history and problem list. Problem list updated.  Objective  Blood pressure 130/76, weight 282 lb (127.9 kg), last menstrual period 12/25/2017, unknown if currently breastfeeding.  Pregravid weight 284 lb (128.8 kg) Total Weight Gain -2 lb (-0.907 kg) Urinalysis:  Urine dipstick shows negative for glucose, protein. Fetal Status: Fetal Heart Rate (bpm): 159   Movement: Present     General:  Alert, oriented and cooperative. Patient is in no acute distress.  Skin: Skin is warm and dry. No rash noted.   Cardiovascular: Normal heart rate noted  Respiratory: Normal respiratory effort, no problems with respiration noted  Abdomen:  Soft, gravid, appropriate for gestational age. Pain/Pressure: Present     Pelvic:  Cervical exam deferred        Extremities: Normal range of motion.     Mental Status: Normal mood and affect. Normal behavior. Normal judgment and thought content.     Assessment   23 y.o. G2P1001 at 7463w4d, EDD 10/01/2018 by Last Menstrual Period presenting for a routine prenatal visit.  Plan   pregnancy Problems (from 02/02/18 to present)    Problem Noted Resolved   Low lying placenta with hemorrhage, antepartum 06/22/2018 by [name], Andreas, MD No   Overview Signed 06/22/2018  8:51 AM by [name], Andreas, MD    Resolved 06/22/2018      Abnormal glucose tolerance test in pregnancy 03/13/2018 by [name], Stephen D, MD No   Overview Signed 06/22/2018 10:00 AM by [name], Andreas, MD    Early 1-hr 149 3-hr 86, 173, 130, 98      Depression with anxiety 02/10/2018 by [name], Stephen D, MD No   Obesity affecting pregnancy 02/10/2018 by [name], Stephen D, MD No   BMI 45.0-49.9, adult [facility](HCC) 02/10/2018 by [name], Stephen D, MD No   Supervision of high risk pregnancy, antepartum 02/02/2018 by [name], Stephen D, MD No   Overview Addendum 06/22/2018 10:03 AM by [name], Andreas, MD    Clinic [facility] Prenatal Labs  Dating LMP = 6 week US Blood type: B/Positive/-- (11/11 1540)   Genetic Screen NIPS: 86, 173, 130, 98 Antibody:Negative (11/11 1540)  Anatomic US Complete on 25 week follow, low lying placenta resolved Rubella: 10.80 (11/11 1540) Varicella: Immune  GTT Early 1-hr 1493-hr 86, 173, 130, 98  Third trimester:  RPR: Non Reactive (11/11 1540)   Rhogam N/A HBsAg: Negative (11/11 1540)   TDaP vaccine                       Flu Shot: HIV:  Non Reactive (11/11 1540)   Baby Food                                GBS:   Contraception  Pap: 10/13/2017 NIL  CBB     CS/VBAC    Support Person            Pregnancy occurring while using intrauterine contraceptive device (IUD) 02/02/2018 by Conard Novak, MD No     Growth scan today with EFW 3 lb 10 oz, growth percentile 83.7, AFI 15.8 cm.  3 hour GTT this visit. Encouraged pregnancy support belt to help with pelvic girdle/pubic bone pain.  Preterm labor symptoms and general obstetric precautions were reviewed.  Please refer to After Visit Summary for other counseling recommendations.   Return in about 4 weeks (around 08/10/2018) for ROB/growth scan.  Marcelyn Bruins, CNM 07/13/2018

## 2018-07-13 NOTE — Progress Notes (Signed)
28 wk labs & growth scan today.Pain in pelvic bone area constant on Saturday. Intermittent since then.

## 2018-07-14 ENCOUNTER — Other Ambulatory Visit: Payer: Self-pay | Admitting: Obstetrics and Gynecology

## 2018-07-14 DIAGNOSIS — O99019 Anemia complicating pregnancy, unspecified trimester: Secondary | ICD-10-CM

## 2018-07-14 DIAGNOSIS — D649 Anemia, unspecified: Secondary | ICD-10-CM | POA: Insufficient documentation

## 2018-07-14 DIAGNOSIS — O0990 Supervision of high risk pregnancy, unspecified, unspecified trimester: Secondary | ICD-10-CM

## 2018-07-14 LAB — CBC
Hematocrit: 33.3 % — ABNORMAL LOW (ref 34.0–46.6)
Hemoglobin: 10.6 g/dL — ABNORMAL LOW (ref 11.1–15.9)
MCH: 27.8 pg (ref 26.6–33.0)
MCHC: 31.8 g/dL (ref 31.5–35.7)
MCV: 87 fL (ref 79–97)
Platelets: 361 10*3/uL (ref 150–450)
RBC: 3.81 x10E6/uL (ref 3.77–5.28)
RDW: 12.4 % (ref 11.7–15.4)
WBC: 10.3 10*3/uL (ref 3.4–10.8)

## 2018-07-14 LAB — RPR: RPR Ser Ql: NONREACTIVE

## 2018-07-14 LAB — HIV ANTIBODY (ROUTINE TESTING W REFLEX): HIV Screen 4th Generation wRfx: NONREACTIVE

## 2018-07-14 LAB — GESTATIONAL GLUCOSE TOLERANCE
Glucose, Fasting: 87 mg/dL (ref 65–94)
Glucose, GTT - 1 Hour: 190 mg/dL — ABNORMAL HIGH (ref 65–179)
Glucose, GTT - 2 Hour: 111 mg/dL (ref 65–154)
Glucose, GTT - 3 Hour: 112 mg/dL (ref 65–139)

## 2018-07-14 MED ORDER — FERROUS SULFATE 325 (65 FE) MG PO TABS: 325.0000 mg | ORAL_TABLET | Freq: Every day | ORAL | 1 refills | Status: DC

## 2018-07-15 NOTE — Patient Instructions (Signed)
Third Trimester of Pregnancy The third trimester is from week 28 through week 40 (months 7 through 9). The third trimester is a time when the unborn baby (fetus) is growing rapidly. At the end of the ninth month, the fetus is about 20 inches in length and weighs 6-10 pounds. Body changes during your third trimester Your body will continue to go through many changes during pregnancy. The changes vary from woman to woman. During the third trimester:  Your weight will continue to increase. You can expect to gain 25-35 pounds (11-16 kg) by the end of the pregnancy.  You may begin to get stretch marks on your hips, abdomen, and breasts.  You may urinate more often because the fetus is moving lower into your pelvis and pressing on your bladder.  You may develop or continue to have heartburn. This is caused by increased hormones that slow down muscles in the digestive tract.  You may develop or continue to have constipation because increased hormones slow digestion and cause the muscles that push waste through your intestines to relax.  You may develop hemorrhoids. These are swollen veins (varicose veins) in the rectum that can itch or be painful.  You may develop swollen, bulging veins (varicose veins) in your legs.  You may have increased body aches in the pelvis, back, or thighs. This is due to weight gain and increased hormones that are relaxing your joints.  You may have changes in your hair. These can include thickening of your hair, rapid growth, and changes in texture. Some women also have hair loss during or after pregnancy, or hair that feels dry or thin. Your hair will most likely return to normal after your baby is born.  Your breasts will continue to grow and they will continue to become tender. A yellow fluid (colostrum) may leak from your breasts. This is the first milk you are producing for your baby.  Your belly button may stick out.  You may notice more swelling in your hands,  face, or ankles.  You may have increased tingling or numbness in your hands, arms, and legs. The skin on your belly may also feel numb.  You may feel short of breath because of your expanding uterus.  You may have more problems sleeping. This can be caused by the size of your belly, increased need to urinate, and an increase in your body's metabolism.  You may notice the fetus "dropping," or moving lower in your abdomen (lightening).  You may have increased vaginal discharge.  You may notice your joints feel loose and you may have pain around your pelvic bone. What to expect at prenatal visits You will have prenatal exams every 2 weeks until week 36. Then you will have weekly prenatal exams. During a routine prenatal visit:  You will be weighed to make sure you and the baby are growing normally.  Your blood pressure will be taken.  Your abdomen will be measured to track your baby's growth.  The fetal heartbeat will be listened to.  Any test results from the previous visit will be discussed.  You may have a cervical check near your due date to see if your cervix has softened or thinned (effaced).  You will be tested for Group B streptococcus. This happens between 35 and 37 weeks. Your health care provider may ask you:  What your birth plan is.  How you are feeling.  If you are feeling the baby move.  If you have had any abnormal   symptoms, such as leaking fluid, bleeding, severe headaches, or abdominal cramping.  If you are using any tobacco products, including cigarettes, chewing tobacco, and electronic cigarettes.  If you have any questions. Other tests or screenings that may be performed during your third trimester include:  Blood tests that check for low iron levels (anemia).  Fetal testing to check the health, activity level, and growth of the fetus. Testing is done if you have certain medical conditions or if there are problems during the pregnancy.  Nonstress test  (NST). This test checks the health of your baby to make sure there are no signs of problems, such as the baby not getting enough oxygen. During this test, a belt is placed around your belly. The baby is made to move, and its heart rate is monitored during movement. What is false labor? False labor is a condition in which you feel small, irregular tightenings of the muscles in the womb (contractions) that usually go away with rest, changing position, or drinking water. These are called Braxton Hicks contractions. Contractions may last for hours, days, or even weeks before true labor sets in. If contractions come at regular intervals, become more frequent, increase in intensity, or become painful, you should see your health care provider. What are the signs of labor?  Abdominal cramps.  Regular contractions that start at 10 minutes apart and become stronger and more frequent with time.  Contractions that start on the top of the uterus and spread down to the lower abdomen and back.  Increased pelvic pressure and dull back pain.  A watery or bloody mucus discharge that comes from the vagina.  Leaking of amniotic fluid. This is also known as your "water breaking." It could be a slow trickle or a gush. Let your health care provider know if it has a color or strange odor. If you have any of these signs, call your health care provider right away, even if it is before your due date. Follow these instructions at home: Medicines  Follow your health care provider's instructions regarding medicine use. Specific medicines may be either safe or unsafe to take during pregnancy.  Take a prenatal vitamin that contains at least 600 micrograms (mcg) of folic acid.  If you develop constipation, try taking a stool softener if your health care provider approves. Eating and drinking   Eat a balanced diet that includes fresh fruits and vegetables, whole grains, good sources of protein such as meat, eggs, or tofu,  and low-fat dairy. Your health care provider will help you determine the amount of weight gain that is right for you.  Avoid raw meat and uncooked cheese. These carry germs that can cause birth defects in the baby.  If you have low calcium intake from food, talk to your health care provider about whether you should take a daily calcium supplement.  Eat four or five small meals rather than three large meals a day.  Limit foods that are high in fat and processed sugars, such as fried and sweet foods.  To prevent constipation: ? Drink enough fluid to keep your urine clear or pale yellow. ? Eat foods that are high in fiber, such as fresh fruits and vegetables, whole grains, and beans. Activity  Exercise only as directed by your health care provider. Most women can continue their usual exercise routine during pregnancy. Try to exercise for 30 minutes at least 5 days a week. Stop exercising if you experience uterine contractions.  Avoid heavy lifting.  Do   not exercise in extreme heat or humidity, or at high altitudes.  Wear low-heel, comfortable shoes.  Practice good posture.  You may continue to have sex unless your health care provider tells you otherwise. Relieving pain and discomfort  Take frequent breaks and rest with your legs elevated if you have leg cramps or low back pain.  Take warm sitz baths to soothe any pain or discomfort caused by hemorrhoids. Use hemorrhoid cream if your health care provider approves.  Wear a good support bra to prevent discomfort from breast tenderness.  If you develop varicose veins: ? Wear support pantyhose or compression stockings as told by your healthcare provider. ? Elevate your feet for 15 minutes, 3-4 times a day. Prenatal care  Write down your questions. Take them to your prenatal visits.  Keep all your prenatal visits as told by your health care provider. This is important. Safety  Wear your seat belt at all times when driving.  Make  a list of emergency phone numbers, including numbers for family, friends, the hospital, and police and fire departments. General instructions  Avoid cat litter boxes and soil used by cats. These carry germs that can cause birth defects in the baby. If you have a cat, ask someone to clean the litter box for you.  Do not travel far distances unless it is absolutely necessary and only with the approval of your health care provider.  Do not use hot tubs, steam rooms, or saunas.  Do not drink alcohol.  Do not use any products that contain nicotine or tobacco, such as cigarettes and e-cigarettes. If you need help quitting, ask your health care provider.  Do not use any medicinal herbs or unprescribed drugs. These chemicals affect the formation and growth of the baby.  Do not douche or use tampons or scented sanitary pads.  Do not cross your legs for long periods of time.  To prepare for the arrival of your baby: ? Take prenatal classes to understand, practice, and ask questions about labor and delivery. ? Make a trial run to the hospital. ? Visit the hospital and tour the maternity area. ? Arrange for maternity or paternity leave through employers. ? Arrange for family and friends to take care of pets while you are in the hospital. ? Purchase a rear-facing car seat and make sure you know how to install it in your car. ? Pack your hospital bag. ? Prepare the baby's nursery. Make sure to remove all pillows and stuffed animals from the baby's crib to prevent suffocation.  Visit your dentist if you have not gone during your pregnancy. Use a soft toothbrush to brush your teeth and be gentle when you floss. Contact a health care provider if:  You are unsure if you are in labor or if your water has broken.  You become dizzy.  You have mild pelvic cramps, pelvic pressure, or nagging pain in your abdominal area.  You have lower back pain.  You have persistent nausea, vomiting, or  diarrhea.  You have an unusual or bad smelling vaginal discharge.  You have pain when you urinate. Get help right away if:  Your water breaks before 37 weeks.  You have regular contractions less than 5 minutes apart before 37 weeks.  You have a fever.  You are leaking fluid from your vagina.  You have spotting or bleeding from your vagina.  You have severe abdominal pain or cramping.  You have rapid weight loss or weight gain.  You have   shortness of breath with chest pain.  You notice sudden or extreme swelling of your face, hands, ankles, feet, or legs.  Your baby makes fewer than 10 movements in 2 hours.  You have severe headaches that do not go away when you take medicine.  You have vision changes. Summary  The third trimester is from week 28 through week 40, months 7 through 9. The third trimester is a time when the unborn baby (fetus) is growing rapidly.  During the third trimester, your discomfort may increase as you and your baby continue to gain weight. You may have abdominal, leg, and back pain, sleeping problems, and an increased need to urinate.  During the third trimester your breasts will keep growing and they will continue to become tender. A yellow fluid (colostrum) may leak from your breasts. This is the first milk you are producing for your baby.  False labor is a condition in which you feel small, irregular tightenings of the muscles in the womb (contractions) that eventually go away. These are called Braxton Hicks contractions. Contractions may last for hours, days, or even weeks before true labor sets in.  Signs of labor can include: abdominal cramps; regular contractions that start at 10 minutes apart and become stronger and more frequent with time; watery or bloody mucus discharge that comes from the vagina; increased pelvic pressure and dull back pain; and leaking of amniotic fluid. This information is not intended to replace advice given to you by your  health care provider. Make sure you discuss any questions you have with your health care provider. Document Released: 03/05/2001 Document Revised: 04/16/2016 Document Reviewed: 04/16/2016 [employer] Interactive Patient Education  2019 [employer]  

## 2018-08-10 ENCOUNTER — Ambulatory Visit (INDEPENDENT_AMBULATORY_CARE_PROVIDER_SITE_OTHER): Payer: Managed Care, Other (non HMO)

## 2018-08-10 ENCOUNTER — Encounter: Payer: Self-pay | Admitting: Obstetrics and Gynecology

## 2018-08-10 ENCOUNTER — Other Ambulatory Visit: Payer: Self-pay

## 2018-08-10 ENCOUNTER — Observation Stay
Admission: EM | Admit: 2018-08-10 | Discharge: 2018-08-10 | Disposition: A | Discharge status: Home / Self Care | Payer: Managed Care, Other (non HMO) | Attending: Obstetrics & Gynecology | Admitting: Obstetrics & Gynecology

## 2018-08-10 ENCOUNTER — Ambulatory Visit (INDEPENDENT_AMBULATORY_CARE_PROVIDER_SITE_OTHER): Payer: Managed Care, Other (non HMO) | Admitting: Obstetrics and Gynecology

## 2018-08-10 VITALS — BP 148/92 | Wt 287.0 lb

## 2018-08-10 DIAGNOSIS — Z3A32 32 weeks gestation of pregnancy: Secondary | ICD-10-CM | POA: Diagnosis not present

## 2018-08-10 DIAGNOSIS — O26893 Other specified pregnancy related conditions, third trimester: Principal | ICD-10-CM | POA: Insufficient documentation

## 2018-08-10 DIAGNOSIS — F418 Other specified anxiety disorders: Secondary | ICD-10-CM

## 2018-08-10 DIAGNOSIS — Z6841 Body Mass Index (BMI) 40.0 and over, adult: Secondary | ICD-10-CM

## 2018-08-10 DIAGNOSIS — O99019 Anemia complicating pregnancy, unspecified trimester: Secondary | ICD-10-CM

## 2018-08-10 DIAGNOSIS — R030 Elevated blood-pressure reading, without diagnosis of hypertension: Secondary | ICD-10-CM | POA: Insufficient documentation

## 2018-08-10 DIAGNOSIS — O163 Unspecified maternal hypertension, third trimester: Secondary | ICD-10-CM

## 2018-08-10 DIAGNOSIS — Z87891 Personal history of nicotine dependence: Secondary | ICD-10-CM | POA: Insufficient documentation

## 2018-08-10 DIAGNOSIS — O99213 Obesity complicating pregnancy, third trimester: Secondary | ICD-10-CM

## 2018-08-10 DIAGNOSIS — Z362 Encounter for other antenatal screening follow-up: Secondary | ICD-10-CM

## 2018-08-10 DIAGNOSIS — O99013 Anemia complicating pregnancy, third trimester: Secondary | ICD-10-CM | POA: Diagnosis not present

## 2018-08-10 DIAGNOSIS — O4450 Low lying placenta with hemorrhage, unspecified trimester: Secondary | ICD-10-CM

## 2018-08-10 DIAGNOSIS — O0990 Supervision of high risk pregnancy, unspecified, unspecified trimester: Secondary | ICD-10-CM

## 2018-08-10 DIAGNOSIS — O99810 Abnormal glucose complicating pregnancy: Secondary | ICD-10-CM

## 2018-08-10 DIAGNOSIS — D649 Anemia, unspecified: Secondary | ICD-10-CM | POA: Diagnosis not present

## 2018-08-10 DIAGNOSIS — O4453 Low lying placenta with hemorrhage, third trimester: Secondary | ICD-10-CM

## 2018-08-10 DIAGNOSIS — O09891 Supervision of other high risk pregnancies, first trimester: Secondary | ICD-10-CM

## 2018-08-10 DIAGNOSIS — Z3689 Encounter for other specified antenatal screening: Secondary | ICD-10-CM

## 2018-08-10 LAB — POCT URINALYSIS DIPSTICK OB: Glucose, UA: NEGATIVE

## 2018-08-10 LAB — COMPREHENSIVE METABOLIC PANEL
ALT: 16 U/L (ref 0–44)
AST: 16 U/L (ref 15–41)
Albumin: 2.8 g/dL — ABNORMAL LOW (ref 3.5–5.0)
Alkaline Phosphatase: 102 U/L (ref 38–126)
Anion gap: 8 (ref 5–15)
BUN: 7 mg/dL (ref 6–20)
CO2: 21 mmol/L — ABNORMAL LOW (ref 22–32)
Calcium: 8.2 mg/dL — ABNORMAL LOW (ref 8.9–10.3)
Chloride: 108 mmol/L (ref 98–111)
Creatinine, Ser: 0.39 mg/dL — ABNORMAL LOW (ref 0.44–1.00)
GFR calc Af Amer: >60 mL/min (ref >60)
GFR calc non Af Amer: >60 mL/min (ref >60)
Glucose, Bld: 82 mg/dL (ref 70–99)
Potassium: 3.4 mmol/L — ABNORMAL LOW (ref 3.5–5.1)
Sodium: 137 mmol/L (ref 135–145)
Total Bilirubin: 0.4 mg/dL (ref 0.3–1.2)
Total Protein: 6.4 g/dL — ABNORMAL LOW (ref 6.5–8.1)

## 2018-08-10 LAB — CBC
HCT: 32.6 % — ABNORMAL LOW (ref 36.0–46.0)
Hemoglobin: 10.6 g/dL — ABNORMAL LOW (ref 12.0–15.0)
MCH: 27.7 pg (ref 26.0–34.0)
MCHC: 32.5 g/dL (ref 30.0–36.0)
MCV: 85.3 fL (ref 80.0–100.0)
Platelets: 335 10*3/uL (ref 150–400)
RBC: 3.82 MIL/uL — ABNORMAL LOW (ref 3.87–5.11)
RDW: 13.8 % (ref 11.5–15.5)
WBC: 12.8 10*3/uL — ABNORMAL HIGH (ref 4.0–10.5)
nRBC: 0 % (ref 0.0–0.2)

## 2018-08-10 LAB — PROTEIN / CREATININE RATIO, URINE
Creatinine, Urine: 359 mg/dL
Protein Creatinine Ratio: 0.06 mg/mg{Cre} (ref 0.00–0.15)
Total Protein, Urine: 22 mg/dL

## 2018-08-10 NOTE — Discharge Summary (Signed)
RN reviewed discharge instructions with patient. Gave patient opportunity for questions. All questions answered at this time. Pt verbalized understanding. Pt discharged home. 

## 2018-08-10 NOTE — Discharge Summary (Signed)
See Final Progress Note 08/10/2018.

## 2018-08-10 NOTE — Final Progress Note (Signed)
Physician Final Progress Note  Patient ID: Brittney Gill MRN: [id number] DOB/AGE: Jun 13, 1995 23 y.o.  Admit date: 08/10/2018 Admitting provider: Nadara Mustard, MD Discharge date: 08/10/2018  Admission Diagnoses: Elevated blood pressure affecting pregnancy in third trimester, antepartum  Discharge Diagnoses: Same  History of Present Illness: The patient is a 23 y.o. female G2P1001 at [redacted]w[redacted]d who presents for elevated blood pressure readings of 148/92 and 150/92 at her clinic visit this morning. She denies headache, visual changes, or epigastric pain currently. She reports that she has had intermittent headaches, intermittent visual changes, and intermittent RUQ pain since her last clinic visit, but none today. She does not have edema. She denies vaginal bleeding, loss of fluid, and contractions. Her baby is moving well.  Review of Systems: Review of systems negative unless otherwise noted in HPI.   Past Medical History:  Diagnosis Date  . Anxiety   . Depression     Past Surgical History:  Procedure Laterality Date  . FRACTURE SURGERY     Left  . WISDOM TOOTH EXTRACTION      No current facility-administered medications on file prior to encounter.    Current Outpatient Medications on File Prior to Encounter  Medication Sig Dispense Refill  . aspirin EC 81 MG tablet Take by mouth.    Marland Kitchen buPROPion (WELLBUTRIN XL) 150 MG 24 hr tablet Take 1 tablet (150 mg total) by mouth daily. 30 tablet 6  . docusate sodium (COLACE) 100 MG capsule Take 100 mg by mouth 2 (two) times daily.    . famotidine (PEPCID) 20 MG tablet Take 20 mg by mouth 2 (two) times daily.    . ferrous sulfate (FERROUSUL) 325 (65 FE) MG tablet Take 1 tablet (325 mg total) by mouth daily with breakfast. 90 tablet 1  . Prenatal Vit-Fe Fumarate-FA (PRENATAL VITAMIN PO) Take by mouth.    . sertraline (ZOLOFT) 50 MG tablet Take 1 tablet (50 mg total) by mouth daily. 30 tablet 6  . citalopram (CELEXA) 10 MG tablet Take 1 tablet (10  mg total) by mouth daily. (Patient not taking: Reported on 07/13/2018) 30 tablet 3  . omeprazole (PRILOSEC) 10 MG capsule Take 10 mg by mouth daily.    . vitamin C (ASCORBIC ACID) 500 MG tablet Take 500 mg by mouth daily.    . Vitamin D, Ergocalciferol, (DRISDOL) 50000 units CAPS capsule Take 1 capsule (50,000 Units total) by mouth every 7 (seven) days. (Patient not taking: Reported on 07/13/2018) 5 capsule 0    Allergies  Allergen Reactions  . Other Itching    Vitamin E cream  . Diflucan [Fluconazole] Rash    Social History   Socioeconomic History  . Marital status: Single    Spouse name: Not on file  . Number of children: Not on file  . Years of education: Not on file  . Highest education level: Not on file  Occupational History  . Not on file  Social Needs  . Financial resource strain: Not on file  . Food insecurity:    Worry: Not on file    Inability: Not on file  . Transportation needs:    Medical: Not on file    Non-medical: Not on file  Tobacco Use  . Smoking status: Former Smoker    Packs/day: 0.20    Types: Cigarettes    Last attempt to quit: 04/26/2015    Years since quitting: 3.2  . Smokeless tobacco: Never Used  Substance and Sexual Activity  . Alcohol use: No  .  Drug use: No  . Sexual activity: Not Currently    Birth control/protection: I.U.D.  Lifestyle  . Physical activity:    Days per week: Not on file    Minutes per session: Not on file  . Stress: Not on file  Relationships  . Social connections:    Talks on phone: Not on file    Gets together: Not on file    Attends religious service: Not on file    Active member of club or organization: Not on file    Attends meetings of clubs or organizations: Not on file    Relationship status: Not on file  . Intimate partner violence:    Fear of current or ex partner: Not on file    Emotionally abused: Not on file    Physically abused: Not on file    Forced sexual activity: Not on file  Other Topics  Concern  . Not on file  Social History Narrative  . Not on file    Family history:  Negative/unremarkable except as detailed in HPI.    Physical Exam: BP (!) 116/57   Pulse 86   Temp 98.3 F (36.8 C) (Oral)   Resp 16   Ht 5\' 5"  (1.651 m)   Wt 130.2 kg   LMP 12/25/2017   SpO2 99%   BMI 47.76 kg/m   Gen: NAD CV: Regular rate Pulm: No increased work of breathing Pelvic: Deferred Ext: No signs of DVT  NST Baseline: 145 Variability: moderate Accelerations: present Decelerations: absent Tocometry: occasional uterine irritability The patient was monitored for >30 minutes, fetal heart rate tracing was deemed reactive.  Significant Findings/ Diagnostic Studies: labs: CBC, CMP and PC ratio reassuring with no signs of pre-eclampsia. Mild anemia consistent with previous findings and she is taking prenatal vitamins and iron.  Procedures: Reactive NST  Discharge Condition: good  Disposition: Discharge disposition: 01-Home or Self Care       Diet: Regular diet  Discharge Activity: Activity as tolerated  Discharge Instructions    Discharge activity:  No Restrictions   Complete by:  As directed    Discharge diet:  No restrictions   Complete by:  As directed    No sexual activity restrictions   Complete by:  As directed    Notify physician for a general feeling that "something is not right"   Complete by:  As directed    Notify physician for increase or change in vaginal discharge   Complete by:  As directed    Notify physician for intestinal cramps, with or without diarrhea, sometimes described as "gas pain"   Complete by:  As directed    Notify physician for leaking of fluid   Complete by:  As directed    Notify physician for low, dull backache, unrelieved by heat or Tylenol   Complete by:  As directed    Notify physician for menstrual like cramps   Complete by:  As directed    Notify physician for pelvic pressure   Complete by:  As directed    Notify  physician for uterine contractions.  These may be painless and feel like the uterus is tightening or the baby is  "balling up"   Complete by:  As directed    Notify physician for vaginal bleeding   Complete by:  As directed    PRETERM LABOR:  Includes any of the follwing symptoms that occur between 20 - [redacted] weeks gestation.  If these symptoms are not stopped, preterm labor can  result in preterm delivery, placing your baby at risk   Complete by:  As directed      Allergies as of 08/10/2018      Reactions   Other Itching   Vitamin E cream   Diflucan [fluconazole] Rash      Medication List    TAKE these medications   aspirin EC 81 MG tablet Take by mouth.   buPROPion 150 MG 24 hr tablet Commonly known as:  Wellbutrin XL Take 1 tablet (150 mg total) by mouth daily.   citalopram 10 MG tablet Commonly known as:  CeleXA Take 1 tablet (10 mg total) by mouth daily.   docusate sodium 100 MG capsule Commonly known as:  COLACE Take 100 mg by mouth 2 (two) times daily.   famotidine 20 MG tablet Commonly known as:  PEPCID Take 20 mg by mouth 2 (two) times daily.   ferrous sulfate 325 (65 FE) MG tablet Commonly known as:  FerrouSul Take 1 tablet (325 mg total) by mouth daily with breakfast.   omeprazole 10 MG capsule Commonly known as:  PRILOSEC Take 10 mg by mouth daily.   PRENATAL VITAMIN PO Take by mouth.   sertraline 50 MG tablet Commonly known as:  Zoloft Take 1 tablet (50 mg total) by mouth daily.   vitamin C 500 MG tablet Commonly known as:  ASCORBIC ACID Take 500 mg by mouth daily.   Vitamin D (Ergocalciferol) 1.25 MG (50000 UT) Caps capsule Commonly known as:  DRISDOL Take 1 capsule (50,000 Units total) by mouth every 7 (seven) days.      Normotensive and asymptomatic in triage, labs reassuring. Pre-eclampsia precautions reviewed and asked to call for an appointment with recurring symptoms.  Follow up in 1 week in the office.  Signed: Oswaldo Conroy, CNM   08/10/2018, 1:11 PM

## 2018-08-10 NOTE — Progress Notes (Signed)
Routine Prenatal Care Visit  Subjective  Brittney Gill is a 23 y.o. G2P1001 at 2554w4d being seen today for ongoing prenatal care.  She is currently monitored for the following issues for this high-risk pregnancy and has Anxiety; Morbid obesity (HCC); Depression; Hypovitaminosis D; Supervision of high risk pregnancy, antepartum; Pregnancy occurring while using intrauterine contraceptive device (IUD); Depression with anxiety; Obesity affecting pregnancy; BMI 45.0-49.9, adult (HCC); Abnormal glucose tolerance test in pregnancy; Low lying placenta with hemorrhage, antepartum; Elevated blood pressure affecting pregnancy in third trimester, antepartum; and Anemia during pregnancy on their problem list.  ----------------------------------------------------------------------------------- Patient reports intermittent headaches (none now), intermittent visual changes (none now), intermittent RUQ pain (none currently).   Contractions: Not present. Vag. Bleeding: None.  Movement: Present. Denies leaking of fluid.  Growth scan today 73rd %ile, AFI normal ----------------------------------------------------------------------------------- The following portions of the patient's history were reviewed and updated as appropriate: allergies, current medications, past family history, past medical history, past social history, past surgical history and problem list. Problem list updated.   Objective  Blood pressure (!) 148/92, weight 287 lb (130.2 kg), last menstrual period 12/25/2017, unknown if currently breastfeeding. Repeat BP 150/92 Pregravid weight 284 lb (128.8 kg) Total Weight Gain 3 lb (1.361 kg) Urinalysis: Urine Protein Trace  Urine Glucose Negative  Fetal Status: Fetal Heart Rate (bpm): present-normal   Movement: Present     General:  Alert, oriented and cooperative. Patient is in no acute distress.  Skin: Skin is warm and dry. No rash noted.   Cardiovascular: Normal heart rate noted  Respiratory:  Normal respiratory effort, no problems with respiration noted  Abdomen: Soft, gravid, appropriate for gestational age. Pain/Pressure: Present     Pelvic:  Cervical exam deferred        Extremities: Normal range of motion.  Edema: None  Mental Status: Normal mood and affect. Normal behavior. Normal judgment and thought content.   Imaging Results [place] Ob Follow Up  Result Date: 08/10/2018 Patient Name: Brittney Gill DOB: 06/01/1995 MRN: [id number] ULTRASOUND REPORT Location: [facility] Date of Service: 08/10/2018 Indications:growth/afi Findings: Mason JimSingleton intrauterine pregnancy is visualized with FHR at 151 BPM. Biometrics give an (U/S) Gestational age of 6457w4d and an (U/S) EDD of 09/17/2018; this does correlates with the clinically established Estimated Date of Delivery: 10/01/2018. Fetal presentation is Cephalic. Placenta: Grade: 2 AFI: 13.30 cm Growth percentile is 73.4. EFW: 2,422 g ( 5 lbs 5 oz ) Impression: 1. 6654w4d Viable Singleton Intrauterine pregnancy previously established criteria. 2. Growth is 73.4 %ile.  AFI is 13.30 cm. 3.Fetal measurements are measuring 2 weeks ahead symmetrically. Clydene LamingJenine M Alessi, RDMS The ultrasound images and findings were reviewed by me and I agree with the above report. Thomasene MohairStephen Vasili Fok, MD, Merlinda FrederickFACOG [facility], [facility] 08/10/2018 11:03 AM       Assessment   22 y.o. G2P1001 at 4054w4d by  10/01/2018, by Last Menstrual Period presenting for routine prenatal visit  Plan   pregnancy Problems (from 02/02/18 to present)    Problem Noted Resolved   Anemia during pregnancy 07/14/2018 by [name], Andreas, MD No   Overview Signed 07/14/2018  9:03 AM by [name], Andreas, MD    Start 325mg  ferrous sulfate 07/14/2018      Elevated blood pressure affecting pregnancy in third trimester, antepartum 06/26/2018 by Nadara MustardHarris, Robert P, MD No   Low lying placenta with hemorrhage, antepartum 06/22/2018 by [name], Andreas, MD No   Overview Signed 06/22/2018  8:51 AM by  [name], Andreas, MD  Resolved 06/22/2018      Abnormal glucose tolerance test in pregnancy 03/13/2018 by Conard Novak, MD No   Overview Signed 06/22/2018 10:00 AM by Vena Austria, MD    Early 1-hr 149 3-hr 86, 173, 130, 98      Depression with anxiety 02/10/2018 by Conard Novak, MD No   Obesity affecting pregnancy 02/10/2018 by Conard Novak, MD No   BMI 45.0-49.9, adult [facility]) 02/10/2018 by Conard Novak, MD No   Supervision of high risk pregnancy, antepartum 02/02/2018 by Conard Novak, MD No   Overview Addendum 07/14/2018  9:04 AM by Vena Austria, MD    Clinic [facility] Prenatal Labs  Dating LMP = 6 week [place] Blood type: B/Positive/-- (11/11 1540)   Genetic Screen NIPS: 86, 173, 130, 98 Antibody:Negative (11/11 1540)  Anatomic [place] Complete on 25 week follow, low lying placenta resolved Rubella: 10.80 (11/11 1540) Varicella: Immune  GTT Early 1-hr 149 3-hr 86, 173, 130, 98  Third trimester: 87, 190, 111, 112 RPR: Non Reactive (11/11 1540)   Rhogam N/A HBsAg: Negative (11/11 1540)   TDaP vaccine                       Flu Shot: HIV: Non Reactive (11/11 1540)   Baby Food                                GBS:   Contraception  Pap: 10/13/2017 NIL  CBB     CS/VBAC    Support Person            Pregnancy occurring while using intrauterine contraceptive device (IUD) 02/02/2018 by Conard Novak, MD No       Preterm labor symptoms and general obstetric precautions including but not limited to vaginal bleeding, contractions, leaking of fluid and fetal movement were reviewed in detail with the patient. Please refer to After Visit Summary for other counseling recommendations.   - to L&D for further evaluation of BPs, given intermittent symptoms.  L&D aware.  Return in about 1 week (around 08/17/2018) for Routine Prenatal Appointment.  Thomasene Mohair, MD, [facility], [facility] 08/10/2018 11:18 AM

## 2018-08-10 NOTE — OB Triage Note (Signed)
Pt presents from office for [facility] workup. Pt denies any pain, LOF, or bleeding. Reports positive fetal movement. Pt denies HA, blurry vision, or epigastric pain at the moment but has experienced these symptoms off and on the past few days. No clonus, 2+ reflexes, lungs clear. Vitals WNL. Bp cycling q15 mins. Will continue to monitor.

## 2018-08-18 ENCOUNTER — Other Ambulatory Visit: Payer: Self-pay

## 2018-08-18 ENCOUNTER — Ambulatory Visit (INDEPENDENT_AMBULATORY_CARE_PROVIDER_SITE_OTHER): Payer: Managed Care, Other (non HMO) | Admitting: Obstetrics and Gynecology

## 2018-08-18 ENCOUNTER — Encounter: Payer: Self-pay | Admitting: Obstetrics and Gynecology

## 2018-08-18 VITALS — BP 120/70 | Wt 284.0 lb

## 2018-08-18 DIAGNOSIS — Z6841 Body Mass Index (BMI) 40.0 and over, adult: Secondary | ICD-10-CM

## 2018-08-18 DIAGNOSIS — Z3A33 33 weeks gestation of pregnancy: Secondary | ICD-10-CM

## 2018-08-18 DIAGNOSIS — O99213 Obesity complicating pregnancy, third trimester: Secondary | ICD-10-CM

## 2018-08-18 DIAGNOSIS — R030 Elevated blood-pressure reading, without diagnosis of hypertension: Secondary | ICD-10-CM

## 2018-08-18 DIAGNOSIS — O26893 Other specified pregnancy related conditions, third trimester: Secondary | ICD-10-CM

## 2018-08-18 DIAGNOSIS — O0990 Supervision of high risk pregnancy, unspecified, unspecified trimester: Secondary | ICD-10-CM

## 2018-08-18 DIAGNOSIS — O99013 Anemia complicating pregnancy, third trimester: Secondary | ICD-10-CM

## 2018-08-18 NOTE — Progress Notes (Signed)
Routine Prenatal Care Visit  Subjective  Brittney Gill is a 23 y.o. G2P1001 at [redacted]w[redacted]d being seen today for ongoing prenatal care.  She is currently monitored for the following issues for this high-risk pregnancy and has Anxiety; Morbid obesity (HCC); Depression; Hypovitaminosis D; Supervision of high risk pregnancy, antepartum; Pregnancy occurring while using intrauterine contraceptive device (IUD); Depression with anxiety; Obesity affecting pregnancy; BMI 45.0-49.9, adult (HCC); Abnormal glucose tolerance test in pregnancy; Low lying placenta with hemorrhage, antepartum; Elevated blood pressure affecting pregnancy in third trimester, antepartum; and Anemia during pregnancy on their problem list.  ----------------------------------------------------------------------------------- Patient reports pubic bone pain and irregular cramps.   Contractions: Irregular. Vag. Bleeding: None.  Movement: Present. Denies leaking of fluid.  ----------------------------------------------------------------------------------- The following portions of the patient's history were reviewed and updated as appropriate: allergies, current medications, past family history, past medical history, past social history, past surgical history and problem list. Problem list updated.   Objective  Blood pressure 120/70, weight 284 lb (128.8 kg), last menstrual period 12/25/2017, unknown if currently breastfeeding. Pregravid weight 284 lb (128.8 kg) Total Weight Gain 0 lb (0 kg) Urinalysis:      Fetal Status: Fetal Heart Rate (bpm): 150 Fundal Height: 40 cm Movement: Present     General:  Alert, oriented and cooperative. Patient is in no acute distress.  Skin: Skin is warm and dry. No rash noted.   Cardiovascular: Normal heart rate noted  Respiratory: Normal respiratory effort, no problems with respiration noted  Abdomen: Soft, gravid, appropriate for gestational age. Pain/Pressure: Present     Pelvic:  Cervical exam  deferred        Extremities: Normal range of motion.  Edema: None  Mental Status: Normal mood and affect. Normal behavior. Normal judgment and thought content.     Assessment   23 y.o. G2P1001 at [redacted]w[redacted]d by  10/01/2018, by Last Menstrual Period presenting for routine prenatal visit  Plan   pregnancy Problems (from 02/02/18 to present)    Problem Noted Resolved   Anemia during pregnancy 07/14/2018 by Vena Austria, MD No   Overview Signed 07/14/2018  9:03 AM by Vena Austria, MD    Start 325mg  ferrous sulfate 07/14/2018      Elevated blood pressure affecting pregnancy in third trimester, antepartum 06/26/2018 by Nadara Mustard, MD No   Low lying placenta with hemorrhage, antepartum 06/22/2018 by Vena Austria, MD No   Overview Signed 06/22/2018  8:51 AM by Vena Austria, MD    Resolved 06/22/2018      Abnormal glucose tolerance test in pregnancy 03/13/2018 by Conard Novak, MD No   Overview Signed 06/22/2018 10:00 AM by Vena Austria, MD    Early 1-hr 149 3-hr 86, 173, 130, 98      Depression with anxiety 02/10/2018 by Conard Novak, MD No   Obesity affecting pregnancy 02/10/2018 by Conard Novak, MD No   BMI 45.0-49.9, adult [facility]) 02/10/2018 by Conard Novak, MD No   Supervision of high risk pregnancy, antepartum 02/02/2018 by Conard Novak, MD No   Overview Addendum 08/18/2018  8:30 AM by Natale Milch, MD    Clinic [facility] Prenatal Labs  Dating LMP = 6 week [place] Blood type: B/Positive/-- (11/11 1540)   Genetic Screen NIPS: 86, 173, 130, 98 Antibody:Negative (11/11 1540)  Anatomic [place] Complete on 25 week follow, low lying placenta resolved Rubella: 10.80 (11/11 1540) Varicella: Immune  GTT Early 1-hr 149 3-hr 86, 173, 130, 98  Third trimester: 87, 190,  111, 112 RPR: Non Reactive (11/11 1540)   Rhogam N/A HBsAg: Negative (11/11 1540)   TDaP vaccine   07/13/2018     Flu Shot: HIV: Non Reactive (11/11 1540)   Baby Food    Breast                            GBS:   Contraception IUD to bridge to tubal ligation IF has cesarean would like tubal ligation Signed consent 08/18/2018 Pap: 10/13/2017 NIL  CBB     CS/VBAC  hx vaginal delivery   Support Person            Pregnancy occurring while using intrauterine contraceptive device (IUD) 02/02/2018 by [name], Stephen D, MD No       Gestational age appropriate obstetric precautions including but not limited to vaginal bleeding, contractions, leaking of fluid and fetal movement were reviewed in detail with the patient.    Return in about 1 week (around 08/25/2018) for ROB in person.  Natale Milchhristanna R Schuman MD [facility], [facility] 08/18/2018, 8:31 AM

## 2018-08-18 NOTE — Progress Notes (Signed)
ROB C/o pelvic bone pain, period type cramps

## 2018-08-25 ENCOUNTER — Encounter: Payer: Self-pay | Admitting: Obstetrics and Gynecology

## 2018-08-25 ENCOUNTER — Ambulatory Visit (INDEPENDENT_AMBULATORY_CARE_PROVIDER_SITE_OTHER): Payer: Managed Care, Other (non HMO) | Admitting: Obstetrics and Gynecology

## 2018-08-25 ENCOUNTER — Other Ambulatory Visit: Payer: Self-pay

## 2018-08-25 VITALS — BP 126/74 | Wt 288.0 lb

## 2018-08-25 DIAGNOSIS — O99013 Anemia complicating pregnancy, third trimester: Secondary | ICD-10-CM

## 2018-08-25 DIAGNOSIS — O26893 Other specified pregnancy related conditions, third trimester: Secondary | ICD-10-CM

## 2018-08-25 DIAGNOSIS — Z6841 Body Mass Index (BMI) 40.0 and over, adult: Secondary | ICD-10-CM

## 2018-08-25 DIAGNOSIS — F418 Other specified anxiety disorders: Secondary | ICD-10-CM

## 2018-08-25 DIAGNOSIS — O99019 Anemia complicating pregnancy, unspecified trimester: Secondary | ICD-10-CM

## 2018-08-25 DIAGNOSIS — Z3A34 34 weeks gestation of pregnancy: Secondary | ICD-10-CM

## 2018-08-25 DIAGNOSIS — O0990 Supervision of high risk pregnancy, unspecified, unspecified trimester: Secondary | ICD-10-CM

## 2018-08-25 DIAGNOSIS — O99343 Other mental disorders complicating pregnancy, third trimester: Secondary | ICD-10-CM

## 2018-08-25 DIAGNOSIS — M25559 Pain in unspecified hip: Secondary | ICD-10-CM

## 2018-08-25 DIAGNOSIS — O99213 Obesity complicating pregnancy, third trimester: Secondary | ICD-10-CM

## 2018-08-25 DIAGNOSIS — O163 Unspecified maternal hypertension, third trimester: Secondary | ICD-10-CM

## 2018-08-25 NOTE — Progress Notes (Signed)
Routine Prenatal Care Visit  Subjective  Brittney Gill is a 23 y.o. G2P1001 at 3483w5d being seen today for ongoing prenatal care.  She is currently monitored for the following issues for this high-risk pregnancy and has Anxiety; Morbid obesity (HCC); Depression; Hypovitaminosis D; Supervision of high risk pregnancy, antepartum; Pregnancy occurring while using intrauterine contraceptive device (IUD); Depression with anxiety; Obesity affecting pregnancy; BMI 45.0-49.9, adult (HCC); Abnormal glucose tolerance test in pregnancy; Low lying placenta with hemorrhage, antepartum; Elevated blood pressure affecting pregnancy in third trimester, antepartum; and Anemia during pregnancy on their problem list.  ----------------------------------------------------------------------------------- Patient reports pain in her pubic symphysis area.   Contractions: Not present. Vag. Bleeding: None.  Movement: Present. Denies leaking of fluid.  ----------------------------------------------------------------------------------- The following portions of the patient's history were reviewed and updated as appropriate: allergies, current medications, past family history, past medical history, past social history, past surgical history and problem list. Problem list updated.   Objective  Blood pressure 126/74, weight 288 lb (130.6 kg), last menstrual period 12/25/2017, unknown if currently breastfeeding. Pregravid weight 284 lb (128.8 kg) Total Weight Gain 4 lb (1.814 kg) Urinalysis: Urine Protein    Urine Glucose    Fetal Status: Fetal Heart Rate (bpm): 145   Movement: Present     General:  Alert, oriented and cooperative. Patient is in no acute distress.  Skin: Skin is warm and dry. No rash noted.   Cardiovascular: Normal heart rate noted  Respiratory: Normal respiratory effort, no problems with respiration noted  Abdomen: Soft, gravid, appropriate for gestational age. Pain/Pressure: Absent     Pelvic:  Cervical exam  deferred        Extremities: Normal range of motion.  Edema: None  Mental Status: Normal mood and affect. Normal behavior. Normal judgment and thought content.   Assessment   23 y.o. G2P1001 at 7583w5d by  10/01/2018, by Last Menstrual Period presenting for routine prenatal visit  Plan   pregnancy Problems (from 02/02/18 to present)    Problem Noted Resolved   Anemia during pregnancy 07/14/2018 by [name], Andreas, MD No   Overview Signed 07/14/2018  9:03 AM by [name], Andreas, MD    Start 325mg  ferrous sulfate 07/14/2018      Elevated blood pressure affecting pregnancy in third trimester, antepartum 06/26/2018 by Nadara MustardHarris, Robert P, MD No   Low lying placenta with hemorrhage, antepartum 06/22/2018 by [name], Andreas, MD No   Overview Signed 06/22/2018  8:51 AM by [name], Andreas, MD    Resolved 06/22/2018      Abnormal glucose tolerance test in pregnancy 03/13/2018 by [name], Wiley Magan D, MD No   Overview Signed 06/22/2018 10:00 AM by [name], Andreas, MD    Early 1-hr 149 3-hr 86, 173, 130, 98      Depression with anxiety 02/10/2018 by [name], Noya Santarelli D, MD No   Obesity affecting pregnancy 02/10/2018 by [name], Chasten Blaze D, MD No   BMI 45.0-49.9, adult [facility](HCC) 02/10/2018 by [name], Deitrick Ferreri D, MD No   Supervision of high risk pregnancy, antepartum 02/02/2018 by [name], Clarabel Marion D, MD No   Overview Addendum 08/18/2018  8:30 AM by Natale MilchSchuman, Christanna R, MD    Clinic [facility] Prenatal Labs  Dating LMP = 6 week US Blood type: B/Positive/-- (11/11 1540)   Genetic Screen NIPS: 86, 173, 130, 98 Antibody:Negative (11/11 1540)  Anatomic US Complete on 25 week follow, low lying placenta resolved Rubella: 10.80 (11/11 1540) Varicella: Immune  GTT Early 1-hr 149 3-hr 86, 173, 130, 98  Third trimester: 87, 190,  111, 112 RPR: Non Reactive (11/11 1540)   Rhogam N/A HBsAg: Negative (11/11 1540)   TDaP vaccine   07/13/2018     Flu Shot: HIV: Non Reactive (11/11 1540)   Baby Food    Breast                            GBS:   Contraception IUD to bridge to tubal ligation IF has cesarean would like tubal ligation Signed consent 08/18/2018 Pap: 10/13/2017 NIL  CBB     CS/VBAC  hx vaginal delivery   Support Person            Pregnancy occurring while using intrauterine contraceptive device (IUD) 02/02/2018 by Conard Novak, MD No       Preterm labor symptoms and general obstetric precautions including but not limited to vaginal bleeding, contractions, leaking of fluid and fetal movement were reviewed in detail with the patient. Please refer to After Visit Summary for other counseling recommendations.   - Growth u/s in 2 weeks with weekly NSTs thereafter (should get AFIs, also) - Referral to anesthesia for BMI > 45 - Referral to PT for pubic symphysis pain  Return in about 2 weeks (around 09/08/2018) for Growth u/s and routine prenatal/NST.  Thomasene Mohair, MD, [facility], [facility] 08/25/2018 5:58 PM

## 2018-08-30 ENCOUNTER — Inpatient Hospital Stay
Admission: EM | Admit: 2018-08-30 | Discharge: 2018-08-31 | Disposition: A | Discharge status: Home / Self Care | Payer: Managed Care, Other (non HMO) | Attending: Obstetrics & Gynecology | Admitting: Obstetrics & Gynecology

## 2018-08-30 ENCOUNTER — Other Ambulatory Visit: Payer: Self-pay

## 2018-08-30 DIAGNOSIS — O99019 Anemia complicating pregnancy, unspecified trimester: Secondary | ICD-10-CM

## 2018-08-30 DIAGNOSIS — O4450 Low lying placenta with hemorrhage, unspecified trimester: Secondary | ICD-10-CM

## 2018-08-30 DIAGNOSIS — O09891 Supervision of other high risk pregnancies, first trimester: Secondary | ICD-10-CM

## 2018-08-30 DIAGNOSIS — F418 Other specified anxiety disorders: Secondary | ICD-10-CM

## 2018-08-30 DIAGNOSIS — O99810 Abnormal glucose complicating pregnancy: Secondary | ICD-10-CM

## 2018-08-30 DIAGNOSIS — Z6841 Body Mass Index (BMI) 40.0 and over, adult: Secondary | ICD-10-CM

## 2018-08-30 DIAGNOSIS — O99213 Obesity complicating pregnancy, third trimester: Secondary | ICD-10-CM

## 2018-08-30 DIAGNOSIS — O0990 Supervision of high risk pregnancy, unspecified, unspecified trimester: Secondary | ICD-10-CM

## 2018-08-30 DIAGNOSIS — O163 Unspecified maternal hypertension, third trimester: Secondary | ICD-10-CM

## 2018-08-30 LAB — URINALYSIS, ROUTINE W REFLEX MICROSCOPIC
Bilirubin Urine: NEGATIVE
Glucose, UA: NEGATIVE mg/dL
Hgb urine dipstick: NEGATIVE
Ketones, ur: NEGATIVE mg/dL
Leukocytes,Ua: NEGATIVE
Nitrite: NEGATIVE
Protein, ur: 30 mg/dL — AB
Specific Gravity, Urine: 1.027 (ref 1.005–1.030)
pH: 6 (ref 5.0–8.0)

## 2018-08-30 NOTE — OB Triage Note (Signed)
Pt is a G2P1 seen at 35wk3d w/ c/o of abdominal cramping that started around 1400 today. Pt also complains of a sharp pain in her stomach that started  Friday afternoon. Pt rates he rpain a 8/10. Pt denies vaginal bleeding and LOF. Pt states positive fetal movement. FHT 155. Monitors applied and assessing. Will continue to monitor.

## 2018-08-31 ENCOUNTER — Other Ambulatory Visit: Payer: Self-pay

## 2018-08-31 ENCOUNTER — Ambulatory Visit: Payer: Managed Care, Other (non HMO) | Attending: Obstetrics and Gynecology

## 2018-08-31 DIAGNOSIS — R293 Abnormal posture: Secondary | ICD-10-CM | POA: Diagnosis not present

## 2018-08-31 DIAGNOSIS — Z3A35 35 weeks gestation of pregnancy: Secondary | ICD-10-CM | POA: Diagnosis not present

## 2018-08-31 DIAGNOSIS — R109 Unspecified abdominal pain: Secondary | ICD-10-CM

## 2018-08-31 DIAGNOSIS — M62838 Other muscle spasm: Secondary | ICD-10-CM | POA: Insufficient documentation

## 2018-08-31 DIAGNOSIS — O26893 Other specified pregnancy related conditions, third trimester: Secondary | ICD-10-CM | POA: Diagnosis not present

## 2018-08-31 NOTE — Patient Instructions (Addendum)
  When seated, you want to maintain pelvic neutral with the shoulders gently down and back and ears in line with your shoulders. A lumbar roll such as the one below or a home-made towel-roll can be used for this purpose. Even Olympic athletes can only maintain proper seated posture for about 10 minutes without support!    Pictured: The Original [employer] Lumbar Roll     Getting In/out of bed     Lying on back, bend left knee and place left arm across chest. Roll all in one movement to the right. Reverse to roll to the left. Always move as one unit.     Once you are lying on you side, move legs to edge of bed. Pull in the pelvic floor and lower tummy and push down with both hands while moving legs off bed to reach sitting position.  *Reverse sequence to return to lying down.

## 2018-08-31 NOTE — Discharge Summary (Signed)
  See FPN 

## 2018-08-31 NOTE — Therapy (Signed)
[facility] [address] [place], [place], [address] Phone: [phone]   Fax:  [phone]  Physical Therapy Evaluation  The patient has been informed of current processes in place at Outpatient Rehab to protect patients from Covid-19 exposure including social distancing, schedule modifications, and new cleaning procedures. After discussing their particular risk with a therapist based on the patient's personal risk factors, the patient has decided to proceed with in-person therapy.   Patient Details  Name: Brittney Gill MRN: [id number] Date of Birth: 08/13/1995 Referring Provider (PT): Thomasene MohairJackson, Stephen   Encounter Date: 08/31/2018  PT End of Session - 08/31/18 0958    Visit Number  1    Number of Visits  5    Date for PT Re-Evaluation  10/05/18    Authorization - Visit Number  1    Authorization - Number of Visits  5    PT Start Time  0835    PT Stop Time  0940    PT Time Calculation (min)  65 min    Activity Tolerance  Patient tolerated treatment well;No increased pain    Behavior During Therapy  WFL for tasks assessed/performed       Past Medical History:  Diagnosis Date  . Anxiety   . Depression     Past Surgical History:  Procedure Laterality Date  . FRACTURE SURGERY     Left  . WISDOM TOOTH EXTRACTION      There were no vitals filed for this visit.       [facility] - 09/01/18 0001      Assessment   Medical Diagnosis  Pelvic Pain in Pregnancy    Referring Provider (PT)  Thomasene MohairJackson, Stephen    Onset Date/Surgical Date  05/31/18    Prior Therapy  none      Precautions   Precautions  --   [redacted] weeks pregnant     Restrictions   Weight Bearing Restrictions  No      Balance Screen   Has the patient fallen in the past 6 months  No      Home Environment   Living Environment  Private residence    Living Arrangements  Parent    Available Help at Discharge  Family    Type of Home  House    Home Access  Level entry    Home Layout  One level    Home Equipment  None      Prior Function   Level of Independence  Independent    Vocation  --   [employer], furloughed   Vocation Requirements  --   sitting     Cognition   Overall Cognitive Status  Within Functional Limits for tasks assessed         Pelvic Floor Physical Therapy Evaluation and Assessment  SCREENING  Falls in last 6 mo: no  Red Flags:  Have you had any night sweats? Yes due to pregnancy. Unexplained weight loss? no Saddle anesthesia? no Unexplained changes in bowel or bladder habits? no  SUBJECTIVE  Patient reports: Her pain is worse when she is walking and when she has to get out of bed. Pain is mostly in her pubic bone and "sciatica like" in L LE.  Precautions:  [redacted] weeks pregnant  Social/Family/Vocational History:   Furloughed   Recent Procedures/Tests/Findings:  none  Obstetrical History: G2P1, tearing with first  Gynecological History: none  Urinary History: Urinating ~ every hour, drinking ~ 2-3 12  oz of water and ~ 1 glass of un-sweet tea or caffinated soda a day.  Gastrointestinal History: Is having constipation due to increased iron.  Sexual activity/pain: Yes with both initial penetration, deep thrusting.  Location of pain: Pubic symphysis and L LB Current pain:  0/10 (4-5 with movement)  Max pain:  9/10 Least pain:  0/10 Nature of pain: sharp  Patient Goals: No pain in pubic bone or low back   OBJECTIVE  Posture/Observations:  Sitting: Slouched in chair Standing: L foot ER>R Anterior pelvic tilt, R PSIS high Supine: L ASIS high,   Palpation/Segmental Motion/Joint Play: TTP to L Glute min, Piriformis, OI, decreased sacral mobility along L border.  Special tests:   Stork: Decreased stability on R, decreased mobility on L, pain at pubic symphysis with weight shift. Supine-to-long: RLE long in supine, and in sitting.  Supine-to-long-sit:  Range of  Motion/Flexibilty:  Spine: ROM WNL, "pinch" on R with R rot, and L with L rot Hips:   Strength/MMT: Defeered to follow-up LE MMT  LE MMT Left Right  Hip flex:  (L2) /5 /5  Hip ext: /5 /5  Hip abd: /5 /5  Hip add: /5 /5  Hip IR /5 /5  Hip ER /5 /5     Abdominal:  Palpation:TTP to L>R Iliacus and Psoas Diastasis: visualized bulge ~4  Finger widths, not palpated due to pregnancy status.   Pelvic Floor External Exam: Introitus Appears:  Skin integrity:  Palpation: Cough: Prolapse visible?: Scar mobility:  Internal Vaginal Exam: Strength (PERF):  Symmetry: Palpation: Prolapse:   Internal Rectal Exam: Strength (PERF): Symmetry: Palpation: Prolapse:   Gait Analysis:   Pelvic Floor Outcome Measures: PGQ: 48/75 (64%)  INTERVENTIONS THIS SESSION: Manual: Performed TP release to L Psoas, QL, and Piriformis followed by up-slip correction to improve pelvic alignment and allow for decreased spasm and pain. Therex: educated on and practiced side-stretch, piriformis stretch, and hip-flexor stretch to help maintain decreased spasm and pain and allow for maintenance of improved alignment.  Self-care: Educated on the structure and function of the pelvic floor in relation to their symptoms as well as the POC, and initial HEP in order to set patient expectations and understanding from which we will build on in the future sessions. Educated on how to get from supine-to-sit without increasing pressure through the diastasis and not carrying her 23-year old only on her L hip to decrease muscular imbalance.    Total time: 65 min.         Objective measurements completed on examination: See above findings.                PT Short Term Goals - 09/01/18 0818      PT SHORT TERM GOAL #1   Title  Patient will demonstrate HEP x1 in the clinic to demonstrate understanding and proper form to allow for further improvement.    Baseline  Pt. lacks knowledge of therapeutic  exercises that will help her decrease pain and improve function    Time  3    Period  Weeks    Status  New    Target Date  09/22/18      PT SHORT TERM GOAL #2   Title  Patient will demonstrate improved pelvic alignment and balance of musculature surrounding the pelvis to facilitate decreased PFM spasms and decrease pelvic pain.    Baseline  Pt demonstrates L up-slip and spasms surrounding pelvis on L>R    Time  3    Period  Weeks    Status  New    Target Date  09/22/18      PT SHORT TERM GOAL #3   Title  Patient will demonstrate improved sitting and standing posture to demonstrate learning and decrease stress on the pelvic floor with functional activity.    Baseline  Pt. demonstrates hyperlordosis with diastasis recti     Time  3    Period  Weeks    Status  New    Target Date  09/22/18        PT Long Term Goals - 09/01/18 0821      PT LONG TERM GOAL #1   Title  Patient will describe pain no greater than 3/10 during ADL's such as cooking and cleaning to demonstrate improved functional ability.    Baseline  Pain is present at 9/10 at greatest and increased by walking, squatting, bending, etc.    Time  5    Period  Weeks    Status  New    Target Date  10/06/18      PT LONG TERM GOAL #2   Title  Patient will score at or below 13% on the PGQ to demonstrate a clinically significant decrease in disability and improved functional ability.    Baseline  PGQ: 48/75 (64%)    Time  5    Period  Weeks    Status  New    Target Date  10/06/18      PT LONG TERM GOAL #3   Title  Patient will report no pain with intercourse to demonstrate improved functional ability.    Baseline  Pain both with initial penetration and deep thrusting.    Time  5    Period  Weeks    Status  New    Target Date  10/06/18      PT LONG TERM GOAL #4   Title  Patient will report having BM's at least every-other day with consistency between [facility] stool scale 3-5 over the prior week to demonstrate decreased  constipation.    Baseline  Having to strain to empty bowels    Time  5    Period  Weeks    Status  New    Target Date  10/06/18             Plan - 08/31/18 0959    Clinical Impression Statement  Pt. is a 23 y/o female who presents today with cheif c/o pubic symphysis and L LBP during pregnancy as well as mild constipation and urinary frequency in the setting of 3rd trimester pregnancy. Her PMH is significant for obesity and one prior pregnancy and vaginal delivery with some tearing. Her clinical exam revealed a LLE up-slip and spasms surrounding the pelvis as well as hyperlordosis which are contributing to her pain. She will benefit from skilled pelvic PT to address these defecits and to assess for and address other potential causes of pain.     Personal Factors and Comorbidities  Other;Fitness   Pregnancy status   Examination-Activity Limitations  Dressing;Sit;Transfers;Bed Mobility;Sleep;Bend;Lift;Squat;Caring for Others;Locomotion Level;Stairs;Carry;Stand;Toileting    Examination-Participation Restrictions  Interpersonal Relationship;Yard Work;Cleaning;Laundry;Community Activity    Stability/Clinical Decision Making  Evolving/Moderate complexity    Clinical Decision Making  Moderate    Rehab Potential  Gill    PT Frequency  1x / week    PT Duration  Other (comment)   5 weeks   PT Treatment/Interventions  ADLs/Self Care Home Management;Traction;Moist Heat;Gait training;Balance training;Therapeutic exercise;Therapeutic activities;Functional mobility training;Neuromuscular re-education;Patient/family education;Manual  techniques;Dry needling;Taping;Spinal Manipulations;Joint Manipulations    PT Next Visit Plan  re-check alignment, review posture with sit,stand, walk, give TA in quad, tall kneeling squats, taping?    PT Home Exercise Plan  Access Code: [email or web address]     Consulted and Agree with Plan of Care  Patient       Patient will benefit from skilled therapeutic intervention in order  to improve the following deficits and impairments:  Abnormal gait, Decreased balance, Decreased endurance, Difficulty walking, Increased muscle spasms, Improper body mechanics, Obesity, Decreased activity tolerance, Decreased strength, Increased fascial restricitons, Pain, Postural dysfunction  Visit Diagnosis: Abnormal posture  Other muscle spasm     Problem List Patient Active Problem List   Diagnosis Date Noted  . Anemia during pregnancy 07/14/2018  . Elevated blood pressure affecting pregnancy in third trimester, antepartum 06/26/2018  . Low lying placenta with hemorrhage, antepartum 06/22/2018  . Abnormal glucose tolerance test in pregnancy 03/13/2018  . Depression with anxiety 02/10/2018  . Obesity affecting pregnancy 02/10/2018  . BMI 45.0-49.9, adult (HCC) 02/10/2018  . Supervision of high risk pregnancy, antepartum 02/02/2018  . Pregnancy occurring while using intrauterine contraceptive device (IUD) 02/02/2018  . Hypovitaminosis D 10/16/2017  . Depression 10/13/2017  . Anxiety 07/01/2016  . Morbid obesity (HCC) 09/23/2011   Cleophus MoltKeeli T. Gailes DPT, ATC Cleophus MoltKeeli T Gailes 09/01/2018, 8:29 AM  [facility] [address] [place], [place], [address] Phone: [phone]   Fax:  [phone]  Name: Brittney Gill MRN: [id number] Date of Birth: 03/26/1995

## 2018-08-31 NOTE — Final Progress Note (Signed)
Physician Final Progress Note  Patient ID: Brittney Gill MRN: [id number] DOB/AGE: [birth year] 24 y.o.  Admit date: 08/30/2018 Admitting provider: [name], MD Discharge date: 08/31/2018  Admission Diagnoses: 35 weeks, lower abdominal pain pregnancy  Discharge Diagnoses: 35 weeks, lower abdominal pain pregnancy  Consults: None  Significant Findings/ Diagnostic Studies: Patient presented for evaluation of labor.  Patient had cervical exam by RN and this was reported to me. I reviewed her vital signs and fetal tracing, both of which were reassuring.  Patient was discharge as she was not laboring.   Procedures: UA neg  A NST procedure was performed with FHR monitoring and a normal baseline established, appropriate time of 20-40 minutes of evaluation, and accels >2 seen w 15x15 characteristics.  Results show a REACTIVE NST.   Discharge Condition: good  Disposition: Discharge disposition: 01-Home or Self Care       Diet: Regular diet  Discharge Activity: Activity as tolerated  Discharge Instructions    Call MD for:   Complete by:  As directed    Worsening contractions or pain; leakage of fluid; bleeding.   Diet general   Complete by:  As directed    Increase activity slowly   Complete by:  As directed      Allergies as of 08/31/2018      Reactions   Other Itching   Vitamin E cream   Diflucan [fluconazole] Rash      Medication List    TAKE these medications   aspirin EC 81 MG tablet Take by mouth.   buPROPion 150 MG 24 hr tablet Commonly known as:  Wellbutrin XL Take 1 tablet (150 mg total) by mouth daily.   citalopram 10 MG tablet Commonly known as:  CeleXA Take 1 tablet (10 mg total) by mouth daily.   docusate sodium 100 MG capsule Commonly known as:  COLACE Take 100 mg by mouth 2 (two) times daily.   famotidine 20 MG tablet Commonly known as:  PEPCID Take 20 mg by mouth 2 (two) times daily.   ferrous sulfate 325 (65 FE) MG tablet Commonly known as:   FerrouSul Take 1 tablet (325 mg total) by mouth daily with breakfast.   omeprazole 10 MG capsule Commonly known as:  PRILOSEC Take 10 mg by mouth daily.   PRENATAL VITAMIN PO Take by mouth.   sertraline 50 MG tablet Commonly known as:  Zoloft Take 1 tablet (50 mg total) by mouth daily.   vitamin C 500 MG tablet Commonly known as:  ASCORBIC ACID Take 500 mg by mouth daily.   Vitamin D (Ergocalciferol) 1.25 MG (50000 UT) Caps capsule Commonly known as:  DRISDOL Take 1 capsule (50,000 Units total) by mouth every 7 (seven) days.        Total time spent taking care of this patient: TRIAGE  Signed: Hoyt Koch 08/31/2018, 12:09 AM

## 2018-09-01 ENCOUNTER — Ambulatory Visit
Admission: RE | Admit: 2018-09-01 | Discharge: 2018-09-01 | Disposition: A | Discharge status: Home / Self Care | Payer: Managed Care, Other (non HMO) | Source: Ambulatory Visit | Attending: Anesthesiology | Admitting: Anesthesiology

## 2018-09-01 ENCOUNTER — Other Ambulatory Visit: Payer: Self-pay

## 2018-09-01 NOTE — Consult Note (Signed)
[facility] Anesthesia Consultation  Brittney Gill NFA:[id number] DOB: 06/16/1995 DOA: 09/01/2018 PCP: Patient, No Pcp Per   Requesting physician: Dr. Jean RosenthalJackson  Date of consultation: 09/01/18 Reason for consultation: Obesity during pregnancy  CHIEF COMPLAINT:  Obesity during pregnancy  HISTORY OF PRESENT ILLNESS: Brittney Gill  is a 23 y.o. female with a known history of obesity in pregnancy. This is her second pregnancy, prior vaginal delivery with epidural for labor analgesia. Denies hx of cardiovascular disease. Denies hx of asthma. Denies personal or family hx of bleeding disorders. Denies personal or family hx of problems with anesthesia.   PAST MEDICAL HISTORY:   Past Medical History:  Diagnosis Date  . Anxiety   . Depression     PAST SURGICAL HISTORY:  Past Surgical History:  Procedure Laterality Date  . FRACTURE SURGERY     Left  . WISDOM TOOTH EXTRACTION      SOCIAL HISTORY:  Social History   Tobacco Use  . Smoking status: Former Smoker    Packs/day: 0.50    Types: Cigarettes    Start date: 04/01/2016    Last attempt to quit: 12/23/2017    Years since quitting: 0.6  . Smokeless tobacco: Never Used  . Tobacco comment: Had quit in 2017, started back in 2018 but quit when she found out she was pregnant.  Substance Use Topics  . Alcohol use: No    FAMILY HISTORY: No family history on file.  DRUG ALLERGIES:  Allergies  Allergen Reactions  . Other Itching    Vitamin E cream  . Diflucan [Fluconazole] Rash    REVIEW OF SYSTEMS:   RESPIRATORY: No cough, shortness of breath, wheezing.  CARDIOVASCULAR: No chest pain, orthopnea, edema.  HEMATOLOGY: No anemia, easy bruising or bleeding SKIN: No rash or lesion. NEUROLOGIC: No tingling, numbness, weakness.  PSYCHIATRY: No anxiety or depression.   MEDICATIONS AT HOME:  Prior to Admission medications   Medication Sig Start Date End Date Taking? Authorizing Provider  aspirin EC 81 MG  tablet Take by mouth.    [provider]  buPROPion (WELLBUTRIN XL) 150 MG 24 hr tablet Take 1 tablet (150 mg total) by mouth daily. 06/12/18   [name], Stephen D, MD  citalopram (CELEXA) 10 MG tablet Take 1 tablet (10 mg total) by mouth daily. Patient not taking: Reported on 07/13/2018 01/12/18   [name], Stephen D, MD  docusate sodium (COLACE) 100 MG capsule Take 100 mg by mouth 2 (two) times daily.    [provider]  famotidine (PEPCID) 20 MG tablet Take 20 mg by mouth 2 (two) times daily.    [provider]  ferrous sulfate (FERROUSUL) 325 (65 FE) MG tablet Take 1 tablet (325 mg total) by mouth daily with breakfast. 07/14/18   [name], Andreas, MD  omeprazole (PRILOSEC) 10 MG capsule Take 10 mg by mouth daily.    [provider]  Prenatal Vit-Fe Fumarate-FA (PRENATAL VITAMIN PO) Take by mouth.    [provider]  sertraline (ZOLOFT) 50 MG tablet Take 1 tablet (50 mg total) by mouth daily. 06/12/18   [name], Stephen D, MD  vitamin C (ASCORBIC ACID) 500 MG tablet Take 500 mg by mouth daily.    [provider]  Vitamin D, Ergocalciferol, (DRISDOL) 50000 units CAPS capsule Take 1 capsule (50,000 Units total) by mouth every 7 (seven) days. Patient not taking: Reported on 07/13/2018 10/16/17   [name], Stephen D, MD      PHYSICAL EXAMINATION:   VITAL SIGNS: Blood  pressure (!) 130/92, pulse (!) 104, temperature 36.8 C, temperature source Oral, height 5\' 5"  (1.651 m), weight 130.2 kg, last menstrual period 12/25/2017, SpO2 98 %, unknown if currently breastfeeding.  GENERAL:  23 y.o.-year-old patient no acute distress.  HEENT: Head atraumatic, normocephalic. Oropharynx and nasopharynx clear. MP 1, TM distance >3 cm, normal mouth opening. LUNGS: Normal breath sounds bilaterally, no wheezing, rales,rhonchi. No use of accessory muscles of respiration.  CARDIOVASCULAR: S1, S2 normal. No murmurs, rubs, or gallops.  EXTREMITIES: No pedal edema,  cyanosis, or clubbing.  NEUROLOGIC: normal gait PSYCHIATRIC: The patient is alert and oriented x 3.  SKIN: No obvious rash, lesion, or ulcer.    IMPRESSION AND PLAN:   Brittney Gill  is a 23 y.o. female presenting with obesity during pregnancy. BMI is currently 47 at [redacted] weeks gestation.   Airway exam reassuring. Significant back adiposity, midline appreciated, interspaces minimally palpable.   We discussed analgesic options during labor including epidural analgesia. Discussed that in obesity there can be increased difficulty with epidural placement or even failure of successful epidural. We also discussed that even after successful epidural placement there is increased risk of catheter migration out of the epidural space that would require catheter replacement. Discussed use of epidural vs spinal vs GA if cesarean delivery is required. Discussed increased risk of difficult intubation during pregnancy should an emergency cesarean delivery be required.   Plan for delivery at [facility].

## 2018-09-07 ENCOUNTER — Ambulatory Visit: Payer: Managed Care, Other (non HMO)

## 2018-09-08 ENCOUNTER — Ambulatory Visit (INDEPENDENT_AMBULATORY_CARE_PROVIDER_SITE_OTHER): Payer: Managed Care, Other (non HMO)

## 2018-09-08 ENCOUNTER — Other Ambulatory Visit: Payer: Self-pay

## 2018-09-08 ENCOUNTER — Ambulatory Visit (INDEPENDENT_AMBULATORY_CARE_PROVIDER_SITE_OTHER): Payer: Managed Care, Other (non HMO) | Admitting: Obstetrics and Gynecology

## 2018-09-08 ENCOUNTER — Other Ambulatory Visit (HOSPITAL_COMMUNITY)
Admission: RE | Admit: 2018-09-08 | Discharge: 2018-09-08 | Disposition: A | Discharge status: Home / Self Care | Payer: Managed Care, Other (non HMO) | Source: Ambulatory Visit | Attending: Obstetrics and Gynecology | Admitting: Obstetrics and Gynecology

## 2018-09-08 ENCOUNTER — Encounter: Payer: Self-pay | Admitting: Obstetrics and Gynecology

## 2018-09-08 VITALS — BP 122/84 | Wt 290.0 lb

## 2018-09-08 DIAGNOSIS — Z3A36 36 weeks gestation of pregnancy: Secondary | ICD-10-CM

## 2018-09-08 DIAGNOSIS — O0990 Supervision of high risk pregnancy, unspecified, unspecified trimester: Secondary | ICD-10-CM | POA: Insufficient documentation

## 2018-09-08 DIAGNOSIS — Z6841 Body Mass Index (BMI) 40.0 and over, adult: Secondary | ICD-10-CM

## 2018-09-08 DIAGNOSIS — Z362 Encounter for other antenatal screening follow-up: Secondary | ICD-10-CM

## 2018-09-08 DIAGNOSIS — O0993 Supervision of high risk pregnancy, unspecified, third trimester: Secondary | ICD-10-CM

## 2018-09-08 DIAGNOSIS — O99213 Obesity complicating pregnancy, third trimester: Secondary | ICD-10-CM | POA: Diagnosis present

## 2018-09-08 DIAGNOSIS — F418 Other specified anxiety disorders: Secondary | ICD-10-CM

## 2018-09-08 NOTE — Progress Notes (Signed)
Routine Prenatal Care Visit  Subjective  Brittney Gill is a 23 y.o. G2P1001 at [redacted]w[redacted]d being seen today for ongoing prenatal care.  She is currently monitored for the following issues for this high-risk pregnancy and has Anxiety; Morbid obesity ([place]); Depression; Hypovitaminosis D; Supervision of high risk pregnancy, antepartum; Pregnancy occurring while using intrauterine contraceptive device (IUD); Depression with anxiety; Obesity affecting pregnancy; BMI 45.0-49.9, adult ([place]); Abnormal glucose tolerance test in pregnancy; Low lying placenta with hemorrhage, antepartum; Elevated blood pressure affecting pregnancy in third trimester, antepartum; and Anemia during pregnancy on their problem list.  ----------------------------------------------------------------------------------- Patient reports no complaints.   Contractions: Not present. Vag. Bleeding: None.  Movement: Present. Denies leaking of fluid.  U/S shows growth 87th %ile.  AC >97 %ile. AFI 10 cm ----------------------------------------------------------------------------------- The following portions of the patient's history were reviewed and updated as appropriate: allergies, current medications, past family history, past medical history, past social history, past surgical history and problem list. Problem list updated.   Objective  Blood pressure 122/84, weight 290 lb (131.5 kg), last menstrual period 12/25/2017, unknown if currently breastfeeding. Pregravid weight 284 lb (128.8 kg) Total Weight Gain 6 lb (2.722 kg) Urinalysis: Urine Protein    Urine Glucose    Fetal Status: Fetal Heart Rate (bpm): 155   Movement: Present     General:  Alert, oriented and cooperative. Patient is in no acute distress.  Skin: Skin is warm and dry. No rash noted.   Cardiovascular: Normal heart rate noted  Respiratory: Normal respiratory effort, no problems with respiration noted  Abdomen: Soft, gravid, appropriate for gestational age. Pain/Pressure:  Absent     Pelvic:  Cervical exam deferred        Extremities: Normal range of motion.  Edema: None  Mental Status: Normal mood and affect. Normal behavior. Normal judgment and thought content.   NST: Baseline FHR: 155 beats/min Variability: moderate Accelerations: present Decelerations: absent Tocometry: not done  Interpretation:  INDICATIONS: obesity, BMI > 40 RESULTS:  A NST procedure was performed with FHR monitoring and a normal baseline established, appropriate time of 20-40 minutes of evaluation, and accels >2 seen w 15x15 characteristics.  Results show a REACTIVE NST.    Imaging Results US Ob Follow Up  Result Date: 09/08/2018 Patient Name: Brittney Gill DOB: [birth year] MRN: [id number] ULTRASOUND REPORT Location: [facility] Date of Service: 09/08/2018 Indications:growth/afi Findings: Brittney Gill intrauterine pregnancy is visualized with FHR at 163 BPM. Biometrics give an (U/S) Gestational age of [redacted]w[redacted]d and an (U/S) EDD of 09/21/2018; this correlates with the clinically established Estimated Date of Delivery: 10/01/18. Fetal presentation is Cephalic. Placenta: posterior. Grade: 2 AFI: 10.1 cm Growth percentile is 87.1. AC measuring >97.7%tile EFW: 3,576g (7lb 14oz) Impression: 1. [redacted]w[redacted]d Viable Singleton Intrauterine pregnancy previously established criteria. 2. Growth is 87.1 %ile.  AFI is 10.1 cm. [name], RT The ultrasound images and findings were reviewed by me and I agree with the above report. Prentice Docker, MD, [facility], [facility] 09/08/2018 3:11 PM       Assessment   23 y.o. G2P1001 at [redacted]w[redacted]d by  10/01/2018, by Last Menstrual Period presenting for routine prenatal visit  Plan   pregnancy Problems (from 02/02/18 to present)    Problem Noted Resolved   Anemia during pregnancy 07/14/2018 by [name], MD No   Overview Signed 07/14/2018  9:03 AM by [name], MD    Start 325mg  ferrous sulfate 07/14/2018      Elevated blood pressure  affecting  pregnancy in third trimester, antepartum 06/26/2018 by Nadara MustardHarris, Robert P, MD No   Low lying placenta with hemorrhage, antepartum 06/22/2018 by [name], Andreas, MD No   Overview Signed 06/22/2018  8:51 AM by [name], Andreas, MD    Resolved 06/22/2018      Abnormal glucose tolerance test in pregnancy 03/13/2018 by [name], Shanae Luo D, MD No   Overview Signed 06/22/2018 10:00 AM by [name], Andreas, MD    Early 1-hr 149 3-hr 86, 173, 130, 98      Depression with anxiety 02/10/2018 by [name], Shafin Pollio D, MD No   Obesity affecting pregnancy 02/10/2018 by [name], Cloy Cozzens D, MD No   BMI 45.0-49.9, adult [facility](HCC) 02/10/2018 by [name], Kandance Yano D, MD No   Supervision of high risk pregnancy, antepartum 02/02/2018 by [name], Fariha Goto D, MD No   Overview Addendum 08/18/2018  8:30 AM by Natale MilchSchuman, Christanna R, MD    Clinic [facility] Prenatal Labs  Dating LMP = 6 week US Blood type: B/Positive/-- (11/11 1540)   Genetic Screen NIPS: 86, 173, 130, 98 Antibody:Negative (11/11 1540)  Anatomic US Complete on 25 week follow, low lying placenta resolved Rubella: 10.80 (11/11 1540) Varicella: Immune  GTT Early 1-hr 149 3-hr 86, 173, 130, 98  Third trimester: 87, 190, 111, 112 RPR: Non Reactive (11/11 1540)   Rhogam N/A HBsAg: Negative (11/11 1540)   TDaP vaccine   07/13/2018     Flu Shot: HIV: Non Reactive (11/11 1540)   Baby Food    Breast                           GBS:   Contraception IUD to bridge to tubal ligation IF has cesarean would like tubal ligation Signed consent 08/18/2018 Pap: 10/13/2017 NIL  CBB     CS/VBAC  hx vaginal delivery   Support Person          Pregnancy occurring while using intrauterine contraceptive device (IUD) 02/02/2018 by [name], Salmaan Patchin D, MD No      Preterm labor symptoms and general obstetric precautions including but not limited to vaginal bleeding, contractions, leaking of fluid and fetal movement were reviewed in detail with the patient. Please refer to After  Visit Summary for other counseling recommendations.   Return in about 1 week (around 09/15/2018).  Thomasene MohairStephen Aviah Sorci, MD, Merlinda FrederickFACOG [facility], [facility] 09/08/2018 3:24 PM

## 2018-09-10 LAB — CERVICOVAGINAL ANCILLARY ONLY
Chlamydia: NEGATIVE
Neisseria Gonorrhea: NEGATIVE

## 2018-09-10 LAB — STREP GP B NAA: Strep Gp B NAA: NEGATIVE

## 2018-09-14 ENCOUNTER — Ambulatory Visit: Payer: Managed Care, Other (non HMO)

## 2018-09-14 ENCOUNTER — Other Ambulatory Visit: Payer: Self-pay

## 2018-09-14 DIAGNOSIS — M62838 Other muscle spasm: Secondary | ICD-10-CM

## 2018-09-14 DIAGNOSIS — R293 Abnormal posture: Secondary | ICD-10-CM

## 2018-09-14 NOTE — Therapy (Signed)
[place] MAIN [facility] SERVICES [address] [place], [place], [address] Phone: [phone]   Fax:  [phone]  Physical Therapy Treatment  The patient has been informed of current processes in place at Outpatient Rehab to protect patients from Covid-19 exposure including social distancing, schedule modifications, and new cleaning procedures. After discussing their particular risk with a therapist based on the patient's personal risk factors, the patient has decided to proceed with in-person therapy.   Patient Details  Name: Brittney Gill MRN: [id number] Date of Birth: 1996/01/06 Referring Provider (PT): Prentice Docker   Encounter Date: 09/14/2018  PT End of Session - 09/14/18 1122    Visit Number  2    Number of Visits  5    Date for PT Re-Evaluation  10/05/18    Authorization - Visit Number  2    Authorization - Number of Visits  5    PT Start Time  0845    PT Stop Time  7026    PT Time Calculation (min)  53 min    Activity Tolerance  Patient tolerated treatment well;No increased pain    Behavior During Therapy  WFL for tasks assessed/performed       Past Medical History:  Diagnosis Date  . Anxiety   . Depression     Past Surgical History:  Procedure Laterality Date  . FRACTURE SURGERY     Left  . WISDOM TOOTH EXTRACTION      There were no vitals filed for this visit.   Pelvic Floor Physical Therapy Treatment Note  SCREENING  Changes in medications, allergies, or medical history?: no    SUBJECTIVE  Patient reports: She has not had pain into L leg and is doing a little better, still having pain at pubic symphysis when walking.  Precautions:  [redacted] weeks pregnant.  Pain update:  Location of pain: pubic symphysis Current pain:  0/10  Max pain:  8/10 Least pain:  0/10 Nature of pain: sharp  Patient Goals: No pain in pubic bone or low back   OBJECTIVE  Changes in: Posture/Observations:  Hyperextends knees,  anterior pelvic tilt.  Palpation: TTP to R>L adductors and L QL and Piriformis.   Gait Analysis: Pronates on L>R, walks "heavy with B trendelenburg and anterior pelvic tilt.  INTERVENTIONS THIS SESSION: NM re-ed: Educated on and practiced seated, standing, and walking posture to improve pelvic alignment, support the pubic sympysis, and decrease pressure on lumbar nerve roots that are increasing pain and spasm.  Therex: Educated on and practiced adductor stretch in seated To maintain and improve muscle length and allow for improved balance of musculature for long-term symptom relief and posterior pelvic tilts with and without adductor squeeze to decrease lordosis and increase stability around the pelvis as well as improve pubic symphysis alignment. Manual: Performed TP release to B adductors and L QL, lumbar extensors and Piriformis to decrease spasm and pain and allow for improved balance of musculature for improved function and decreased symptoms. Self-care: Pt. Given SI belt and instructed on use both pre-and post-partum for decreased shearing force at the pubic symphysis and decreased pain.  Total time: 53 min.                             PT Short Term Goals - 09/01/18 0818      PT SHORT TERM GOAL #1   Title  Patient will demonstrate HEP x1 in the clinic to  demonstrate understanding and proper form to allow for further improvement.    Baseline  Pt. lacks knowledge of therapeutic exercises that will help her decrease pain and improve function    Time  3    Period  Weeks    Status  New    Target Date  09/22/18      PT SHORT TERM GOAL #2   Title  Patient will demonstrate improved pelvic alignment and balance of musculature surrounding the pelvis to facilitate decreased PFM spasms and decrease pelvic pain.    Baseline  Pt demonstrates L up-slip and spasms surrounding pelvis on L>R    Time  3    Period  Weeks    Status  New    Target Date  09/22/18      PT  SHORT TERM GOAL #3   Title  Patient will demonstrate improved sitting and standing posture to demonstrate learning and decrease stress on the pelvic floor with functional activity.    Baseline  Pt. demonstrates hyperlordosis with diastasis recti     Time  3    Period  Weeks    Status  New    Target Date  09/22/18        PT Long Term Goals - 09/01/18 0821      PT LONG TERM GOAL #1   Title  Patient will describe pain no greater than 3/10 during ADL's such as cooking and cleaning to demonstrate improved functional ability.    Baseline  Pain is present at 9/10 at greatest and increased by walking, squatting, bending, etc.    Time  5    Period  Weeks    Status  New    Target Date  10/06/18      PT LONG TERM GOAL #2   Title  Patient will score at or below 13% on the PGQ to demonstrate a clinically significant decrease in disability and improved functional ability.    Baseline  PGQ: 48/75 (64%)    Time  5    Period  Weeks    Status  New    Target Date  10/06/18      PT LONG TERM GOAL #3   Title  Patient will report no pain with intercourse to demonstrate improved functional ability.    Baseline  Pain both with initial penetration and deep thrusting.    Time  5    Period  Weeks    Status  New    Target Date  10/06/18      PT LONG TERM GOAL #4   Title  Patient will report having BM's at least every-other day with consistency between [facility] stool [facility] 3-5 over the prior week to demonstrate decreased constipation.    Baseline  Having to strain to empty bowels    Time  5    Period  Weeks    Status  New    Target Date  10/06/18            Plan - 09/14/18 1123    Clinical Impression Statement  Pt. responded well to all interventions today, demonstrating decreased pain (from 8/10 to 4/10 with AMB) and spasms as well as understanding of and correct performance of all education and exercises given. Continue per POC or D/C upon delivery of Baby.    Personal Factors and  Comorbidities  Other;Fitness   Pregnancy status   Examination-Activity Limitations  Dressing;Sit;Transfers;Bed Mobility;Sleep;Bend;Lift;Squat;Caring for Others;Locomotion Level;Stairs;Carry;Stand;Toileting    Examination-Participation Restrictions  Interpersonal Relationship;Yard Work;Cleaning;Laundry;Community Activity  Stability/Clinical Decision Making  Evolving/Moderate complexity    Rehab Potential  Gill    PT Frequency  1x / week    PT Duration  Other (comment)   5 weeks   PT Treatment/Interventions  ADLs/Self Care Home Management;Traction;Moist Heat;Gait training;Balance training;Therapeutic exercise;Therapeutic activities;Functional mobility training;Neuromuscular re-education;Patient/family education;Manual techniques;Dry needling;Taping;Spinal Manipulations;Joint Manipulations    PT Next Visit Plan  give TA in quad, tall kneeling squats, taping?    PT Home Exercise Plan  Access Code: [email or web address]     Consulted and Agree with Plan of Care  Patient       Patient will benefit from skilled therapeutic intervention in order to improve the following deficits and impairments:  Abnormal gait, Decreased balance, Decreased endurance, Difficulty walking, Increased muscle spasms, Improper body mechanics, Obesity, Decreased activity tolerance, Decreased strength, Increased fascial restricitons, Pain, Postural dysfunction  Visit Diagnosis: 1. Abnormal posture   2. Other muscle spasm        Problem List Patient Active Problem List   Diagnosis Date Noted  . Anemia during pregnancy 07/14/2018  . Elevated blood pressure affecting pregnancy in third trimester, antepartum 06/26/2018  . Low lying placenta with hemorrhage, antepartum 06/22/2018  . Abnormal glucose tolerance test in pregnancy 03/13/2018  . Depression with anxiety 02/10/2018  . Obesity affecting pregnancy 02/10/2018  . BMI 45.0-49.9, adult (HCC) 02/10/2018  . Supervision of high risk pregnancy, antepartum 02/02/2018  . Pregnancy  occurring while using intrauterine contraceptive device (IUD) 02/02/2018  . Hypovitaminosis D 10/16/2017  . Depression 10/13/2017  . Anxiety 07/01/2016  . Morbid obesity (HCC) 09/23/2011   Cleophus MoltKeeli T. Kellsie Grindle DPT, ATC Cleophus MoltKeeli T Pasty Manninen 09/14/2018, 11:26 AM  [facility] [facility], [facility] [address] [place], [place], [address] Phone: [phone]   Fax:  [phone]  Name: Brittney Gill MRN: [id number] Date of Birth: 10/14/1995

## 2018-09-14 NOTE — Patient Instructions (Signed)
Access Code: [email or web address]  URL: [email or web address]  Date: 09/14/2018  Prepared by: Letitia Libra    Exercises Seated Piriformis Stretch- 3 reps- 5 brreaths hold- 1x daily- 7x weekly  Seated Hip Flexor Stretch- 3 reps- 5 breaths hold- 1x daily- 7x weekly  Seated Lateral Trunk Stretch on Swiss Ball- 3 reps- 5 Breaths hold- 1x daily- 7x weekly  Pelvic Tilt on Swiss Ball- 10 reps- 3 sets- 1x daily- 7x weekly  Seated Hip Adductor Stretch on Swiss Ball- 3 reps- 5 Breaths hold- 1x daily- 7x weekly  Seated Hip Adduction Squeeze with Ball- 10 reps- 2 sets- 1x daily- 7x weekly   Wear the SI belt When you are doing activities, cleaning, walking, etc. Not all the time. It should help if you have an increase in pain, along with your exercises.  ** Hold onto this for post-partum since you are predisposed to having a little extra mobility and meay feel unstable after delivery.  When walking or standing: use "tall posture" unlock knees, and slight pelvic tilt "tuck your tail".

## 2018-09-15 ENCOUNTER — Ambulatory Visit (INDEPENDENT_AMBULATORY_CARE_PROVIDER_SITE_OTHER): Payer: Managed Care, Other (non HMO) | Admitting: Certified Nurse Midwife

## 2018-09-15 ENCOUNTER — Other Ambulatory Visit: Payer: Self-pay

## 2018-09-15 VITALS — BP 110/80 | Wt 290.6 lb

## 2018-09-15 DIAGNOSIS — O0990 Supervision of high risk pregnancy, unspecified, unspecified trimester: Secondary | ICD-10-CM

## 2018-09-15 DIAGNOSIS — Z3A37 37 weeks gestation of pregnancy: Secondary | ICD-10-CM

## 2018-09-15 DIAGNOSIS — O163 Unspecified maternal hypertension, third trimester: Secondary | ICD-10-CM

## 2018-09-15 LAB — POCT URINALYSIS DIPSTICK OB: Glucose, UA: NEGATIVE

## 2018-09-15 NOTE — Progress Notes (Signed)
No problems.rj 

## 2018-09-15 NOTE — Progress Notes (Signed)
Work in appointment at [place] for elevated blood pressures at home yesterday. Was not feeling well yesterday and vomited once. Having abdominal pain all over yesterday.  Reports blood pressure elevations at home of 150/96, 162/100, 151/93 then 138/82 30 min after previous blood pressure.  Today no headaches, N/V, RUQ pain, CP, SOB. Has had some pedal and hand edema. Wt up 1/2 # over past week. Baby has been active. No vaginal bleeding BP today 110/80, trace proteinuria CBC, CMP, PC ratio today Return tomorrow for lab results and NST/AFI as scheduled.  Dalia Heading, CNM

## 2018-09-16 ENCOUNTER — Encounter: Payer: Self-pay | Admitting: Advanced Practice Midwife

## 2018-09-16 ENCOUNTER — Ambulatory Visit (INDEPENDENT_AMBULATORY_CARE_PROVIDER_SITE_OTHER): Payer: Managed Care, Other (non HMO) | Admitting: Advanced Practice Midwife

## 2018-09-16 ENCOUNTER — Ambulatory Visit (INDEPENDENT_AMBULATORY_CARE_PROVIDER_SITE_OTHER): Payer: Managed Care, Other (non HMO)

## 2018-09-16 VITALS — BP 122/74 | Wt 290.0 lb

## 2018-09-16 DIAGNOSIS — Z3A37 37 weeks gestation of pregnancy: Secondary | ICD-10-CM | POA: Diagnosis not present

## 2018-09-16 DIAGNOSIS — Z3689 Encounter for other specified antenatal screening: Secondary | ICD-10-CM

## 2018-09-16 DIAGNOSIS — R030 Elevated blood-pressure reading, without diagnosis of hypertension: Secondary | ICD-10-CM

## 2018-09-16 DIAGNOSIS — O0990 Supervision of high risk pregnancy, unspecified, unspecified trimester: Secondary | ICD-10-CM

## 2018-09-16 DIAGNOSIS — Z6841 Body Mass Index (BMI) 40.0 and over, adult: Secondary | ICD-10-CM

## 2018-09-16 DIAGNOSIS — O99213 Obesity complicating pregnancy, third trimester: Secondary | ICD-10-CM

## 2018-09-16 DIAGNOSIS — O26893 Other specified pregnancy related conditions, third trimester: Secondary | ICD-10-CM

## 2018-09-16 DIAGNOSIS — Z3A36 36 weeks gestation of pregnancy: Secondary | ICD-10-CM

## 2018-09-16 DIAGNOSIS — O0993 Supervision of high risk pregnancy, unspecified, third trimester: Secondary | ICD-10-CM

## 2018-09-16 LAB — CBC WITH DIFFERENTIAL/PLATELET
Basophils Absolute: 0 10*3/uL (ref 0.0–0.2)
Basos: 0 %
EOS (ABSOLUTE): 0 10*3/uL (ref 0.0–0.4)
Eos: 0 %
Hematocrit: 39.2 % (ref 34.0–46.6)
Hemoglobin: 12.4 g/dL (ref 11.1–15.9)
Immature Grans (Abs): 0.1 10*3/uL (ref 0.0–0.1)
Immature Granulocytes: 1 %
Lymphocytes Absolute: 2.1 10*3/uL (ref 0.7–3.1)
Lymphs: 19 %
MCH: 27.6 pg (ref 26.6–33.0)
MCHC: 31.6 g/dL (ref 31.5–35.7)
MCV: 87 fL (ref 79–97)
Monocytes Absolute: 0.7 10*3/uL (ref 0.1–0.9)
Monocytes: 6 %
Neutrophils Absolute: 8 10*3/uL — ABNORMAL HIGH (ref 1.4–7.0)
Neutrophils: 74 %
Platelets: 333 10*3/uL (ref 150–450)
RBC: 4.5 x10E6/uL (ref 3.77–5.28)
RDW: 14.7 % (ref 11.7–15.4)
WBC: 11 10*3/uL — ABNORMAL HIGH (ref 3.4–10.8)

## 2018-09-16 LAB — COMPREHENSIVE METABOLIC PANEL
ALT: 17 IU/L (ref 0–32)
AST: 17 IU/L (ref 0–40)
Albumin/Globulin Ratio: 1.4 (ref 1.2–2.2)
Albumin: 3.6 g/dL — ABNORMAL LOW (ref 3.9–5.0)
Alkaline Phosphatase: 164 IU/L — ABNORMAL HIGH (ref 39–117)
BUN/Creatinine Ratio: 13 (ref 9–23)
BUN: 7 mg/dL (ref 6–20)
Bilirubin Total: <0.2 mg/dL (ref 0.0–1.2)
CO2: 18 mmol/L — ABNORMAL LOW (ref 20–29)
Calcium: 9.2 mg/dL (ref 8.7–10.2)
Chloride: 101 mmol/L (ref 96–106)
Creatinine, Ser: 0.54 mg/dL — ABNORMAL LOW (ref 0.57–1.00)
GFR calc Af Amer: 154 mL/min/{1.73_m2} (ref >59)
GFR calc non Af Amer: 133 mL/min/{1.73_m2} (ref >59)
Globulin, Total: 2.6 g/dL (ref 1.5–4.5)
Glucose: 103 mg/dL — ABNORMAL HIGH (ref 65–99)
Potassium: 3.9 mmol/L (ref 3.5–5.2)
Sodium: 138 mmol/L (ref 134–144)
Total Protein: 6.2 g/dL (ref 6.0–8.5)

## 2018-09-16 LAB — POCT URINALYSIS DIPSTICK OB: Glucose, UA: NEGATIVE

## 2018-09-16 LAB — PROTEIN / CREATININE RATIO, URINE
Creatinine, Urine: 273.9 mg/dL
Protein, Ur: 33.2 mg/dL
Protein/Creat Ratio: 121 mg/g creat (ref 0–200)

## 2018-09-16 NOTE — Patient Instructions (Signed)
Braxton Hicks Contractions Contractions of the uterus can occur throughout pregnancy, but they are not always a sign that you are in labor. You may have practice contractions called Braxton Hicks contractions. These false labor contractions are sometimes confused with true labor. What are Braxton Hicks contractions? Braxton Hicks contractions are tightening movements that occur in the muscles of the uterus before labor. Unlike true labor contractions, these contractions do not result in opening (dilation) and thinning of the cervix. Toward the end of pregnancy (32-34 weeks), Braxton Hicks contractions can happen more often and may become stronger. These contractions are sometimes difficult to tell apart from true labor because they can be very uncomfortable. You should not feel embarrassed if you go to the hospital with false labor. Sometimes, the only way to tell if you are in true labor is for your health care provider to look for changes in the cervix. The health care provider will do a physical exam and may monitor your contractions. If you are not in true labor, the exam should show that your cervix is not dilating and your water has not broken. If there are no other health problems associated with your pregnancy, it is completely safe for you to be sent home with false labor. You may continue to have Braxton Hicks contractions until you go into true labor. How to tell the difference between true labor and false labor True labor  Contractions last 30-70 seconds.  Contractions become very regular.  Discomfort is usually felt in the top of the uterus, and it spreads to the lower abdomen and low back.  Contractions do not go away with walking.  Contractions usually become more intense and increase in frequency.  The cervix dilates and gets thinner. False labor  Contractions are usually shorter and not as strong as true labor contractions.  Contractions are usually irregular.  Contractions  are often felt in the front of the lower abdomen and in the groin.  Contractions may go away when you walk around or change positions while lying down.  Contractions get weaker and are shorter-lasting as time goes on.  The cervix usually does not dilate or become thin. Follow these instructions at home:   Take over-the-counter and prescription medicines only as told by your health care provider.  Keep up with your usual exercises and follow other instructions from your health care provider.  Eat and drink lightly if you think you are going into labor.  If Braxton Hicks contractions are making you uncomfortable: ? Change your position from lying down or resting to walking, or change from walking to resting. ? Sit and rest in a tub of warm water. ? Drink enough fluid to keep your urine pale yellow. Dehydration may cause these contractions. ? Do slow and deep breathing several times an hour.  Keep all follow-up prenatal visits as told by your health care provider. This is important. Contact a health care provider if:  You have a fever.  You have continuous pain in your abdomen. Get help right away if:  Your contractions become stronger, more regular, and closer together.  You have fluid leaking or gushing from your vagina.  You pass blood-tinged mucus (bloody show).  You have bleeding from your vagina.  You have low back pain that you never had before.  You feel your baby's head pushing down and causing pelvic pressure.  Your baby is not moving inside you as much as it used to. Summary  Contractions that occur before labor are   called Braxton Hicks contractions, false labor, or practice contractions.  Braxton Hicks contractions are usually shorter, weaker, farther apart, and less regular than true labor contractions. True labor contractions usually become progressively stronger and regular, and they become more frequent.  Manage discomfort from Braxton Hicks contractions  by changing position, resting in a warm bath, drinking plenty of water, or practicing deep breathing. This information is not intended to replace advice given to you by your health care provider. Make sure you discuss any questions you have with your health care provider. Document Released: 07/25/2016 Document Revised: 12/24/2016 Document Reviewed: 07/25/2016 Elsevier Interactive Patient Education  2019 [employer]  

## 2018-09-16 NOTE — Progress Notes (Signed)
Routine Prenatal Care Visit  Subjective  Brittney Gill is a 23 y.o. G2P1001 at [redacted]w[redacted]d being seen today for ongoing prenatal care.  She is currently monitored for the following issues for this high-risk pregnancy and has Anxiety; Morbid obesity ([place]); Depression; Hypovitaminosis D; Supervision of high risk pregnancy, antepartum; Pregnancy occurring while using intrauterine contraceptive device (IUD); Depression with anxiety; Obesity affecting pregnancy; BMI 45.0-49.9, adult ([place]); Abnormal glucose tolerance test in pregnancy; Elevated blood pressure affecting pregnancy in third trimester, antepartum; and Anemia during pregnancy on their problem list.  ----------------------------------------------------------------------------------- Patient reports some vaginal pressure. She requests cervical sweep and 39 wk IOL.   Contractions: Irregular. Vag. Bleeding: None.  Movement: Present. Denies leaking of fluid.  ----------------------------------------------------------------------------------- The following portions of the patient's history were reviewed and updated as appropriate: allergies, current medications, past family history, past medical history, past social history, past surgical history and problem list. Problem list updated.   Objective  Blood pressure 122/74, weight 290 lb (131.5 kg), last menstrual period 12/25/2017 Pregravid weight 284 lb (128.8 kg) Total Weight Gain 6 lb (2.722 kg) Urinalysis: Urine Protein Trace  Urine Glucose Negative  Fetal Status: Fetal Heart Rate (bpm): 130   Movement: Present  Presentation: Cephalic AFI: 02.6 NST: 20 minute tracing is reactive, 130 bpm baseline, moderate variability, +accelerations to 180s, -decelerations  General:  Alert, oriented and cooperative. Patient is in no acute distress.  Skin: Skin is warm and dry. No rash noted.   Cardiovascular: Normal heart rate noted  Respiratory: Normal respiratory effort, no problems with respiration noted   Abdomen: Soft, gravid, appropriate for gestational age. Pain/Pressure: Present     Pelvic:  Cervical exam and sweep performed 3.5/60/-2  Extremities: Normal range of motion.  Edema: None  Mental Status: Normal mood and affect. Normal behavior. Normal judgment and thought content.   Assessment   23 y.o. G2P1001 at [redacted]w[redacted]d by  10/01/2018, by Last Menstrual Period presenting for routine prenatal visit  Plan   pregnancy Problems (from 02/02/18 to present)    Problem Noted Resolved   Anemia during pregnancy 07/14/2018 by [name], MD No   Overview Signed 07/14/2018  9:03 AM by [name], MD    Start 325mg  ferrous sulfate 07/14/2018      Elevated blood pressure affecting pregnancy in third trimester, antepartum 06/26/2018 by [name], MD No   Abnormal glucose tolerance test in pregnancy 03/13/2018 by [name], MD No   Overview Signed 06/22/2018 10:00 AM by [name], MD    Early 1-hr 149 3-hr 86, 173, 130, 98      Depression with anxiety 02/10/2018 by [name], MD No   Obesity affecting pregnancy 02/10/2018 by [name], MD No   BMI 45.0-49.9, adult [facility]) 02/10/2018 by [name], MD No   Supervision of high risk pregnancy, antepartum 02/02/2018 by [name], MD No   Overview Addendum 08/18/2018  8:30 AM by [name], MD    Clinic [facility] Prenatal Labs  Dating LMP = 6 week [place] Blood type: B/Positive/-- (11/11 1540)   Genetic Screen NIPS: 86, 173, 130, 98 Antibody:Negative (11/11 1540)  Anatomic [place] Complete on 25 week follow, low lying placenta resolved Rubella: 10.80 (11/11 1540) Varicella: Immune  GTT Early 1-hr 149 3-hr 86, 173, 130, 98  Third trimester: 87, 190, 111, 112 RPR: Non Reactive (11/11 1540)   Rhogam N/A HBsAg: Negative (11/11 1540)   TDaP vaccine   07/13/2018     Flu Shot:  HIV: Non Reactive (11/11 1540)   Baby Food    Breast                           GBS:   Contraception IUD to bridge to  tubal ligation IF has cesarean would like tubal ligation Signed consent 08/18/2018 Pap: 10/13/2017 NIL  CBB     CS/VBAC  hx vaginal delivery   Support Person            Pregnancy occurring while using intrauterine contraceptive device (IUD) 02/02/2018 by [name], Stephen D, MD No   Low lying placenta with hemorrhage, antepartum 06/22/2018 by [name], Andreas, MD 09/16/2018 by Tresea MallGledhill, [name], CNM   Overview Signed 06/22/2018  8:51 AM by [name], Andreas, MD    Resolved 06/22/2018          Term labor symptoms and general obstetric precautions including but not limited to vaginal bleeding, contractions, leaking of fluid and fetal movement were reviewed in detail with the patient. Please refer to After Visit Summary for other counseling recommendations.   Return in about 5 days (around 09/21/2018) for AFI/NST/ROB.   IOL is scheduled for 8 AM on 7/2 Go to [facility] for Covid swab on 6/30 between 9 and 11 am  Tresea MallJane Webber Michiels, [place] 09/16/2018 2:59 PM

## 2018-09-16 NOTE — Progress Notes (Signed)
C/o vaginal pressure. Desires cervix check today.

## 2018-09-21 ENCOUNTER — Other Ambulatory Visit: Payer: Self-pay

## 2018-09-21 ENCOUNTER — Ambulatory Visit: Payer: Managed Care, Other (non HMO)

## 2018-09-21 ENCOUNTER — Ambulatory Visit (INDEPENDENT_AMBULATORY_CARE_PROVIDER_SITE_OTHER): Payer: Managed Care, Other (non HMO) | Admitting: Obstetrics and Gynecology

## 2018-09-21 ENCOUNTER — Ambulatory Visit (INDEPENDENT_AMBULATORY_CARE_PROVIDER_SITE_OTHER): Payer: Managed Care, Other (non HMO)

## 2018-09-21 ENCOUNTER — Encounter: Payer: Self-pay | Admitting: Obstetrics and Gynecology

## 2018-09-21 VITALS — BP 130/82 | Wt 294.0 lb

## 2018-09-21 DIAGNOSIS — Z3689 Encounter for other specified antenatal screening: Secondary | ICD-10-CM | POA: Diagnosis not present

## 2018-09-21 DIAGNOSIS — O26893 Other specified pregnancy related conditions, third trimester: Secondary | ICD-10-CM

## 2018-09-21 DIAGNOSIS — Z3A38 38 weeks gestation of pregnancy: Secondary | ICD-10-CM

## 2018-09-21 DIAGNOSIS — Z6841 Body Mass Index (BMI) 40.0 and over, adult: Secondary | ICD-10-CM

## 2018-09-21 DIAGNOSIS — R293 Abnormal posture: Secondary | ICD-10-CM

## 2018-09-21 DIAGNOSIS — R030 Elevated blood-pressure reading, without diagnosis of hypertension: Secondary | ICD-10-CM

## 2018-09-21 DIAGNOSIS — O0990 Supervision of high risk pregnancy, unspecified, unspecified trimester: Secondary | ICD-10-CM

## 2018-09-21 DIAGNOSIS — O99213 Obesity complicating pregnancy, third trimester: Secondary | ICD-10-CM

## 2018-09-21 DIAGNOSIS — O0993 Supervision of high risk pregnancy, unspecified, third trimester: Secondary | ICD-10-CM

## 2018-09-21 DIAGNOSIS — M62838 Other muscle spasm: Secondary | ICD-10-CM

## 2018-09-21 LAB — POCT URINALYSIS DIPSTICK OB: Glucose, UA: NEGATIVE

## 2018-09-21 NOTE — Progress Notes (Signed)
Routine Prenatal Care Visit  Subjective  Brittney Gill is a 23 y.o. G2P1001 at [redacted]w[redacted]d being seen today for ongoing prenatal care.  She is currently monitored for the following issues for this high-risk pregnancy and has Anxiety; Morbid obesity ([place]); Depression; Hypovitaminosis D; Supervision of high risk pregnancy, antepartum; Pregnancy occurring while using intrauterine contraceptive device (IUD); Depression with anxiety; Obesity affecting pregnancy; BMI 45.0-49.9, adult ([place]); Abnormal glucose tolerance test in pregnancy; Elevated blood pressure affecting pregnancy in third trimester, antepartum; and Anemia during pregnancy on their problem list.  ----------------------------------------------------------------------------------- Patient reports no complaints.   Contractions: Not present. Vag. Bleeding: None.  Movement: Present. Denies leaking of fluid.  ----------------------------------------------------------------------------------- The following portions of the patient's history were reviewed and updated as appropriate: allergies, current medications, past family history, past medical history, past social history, past surgical history and problem list. Problem list updated.   Objective  Blood pressure 130/82, weight 294 lb (133.4 kg), last menstrual period 12/25/2017, unknown if currently breastfeeding. Pregravid weight 284 lb (128.8 kg) Total Weight Gain 10 lb (4.536 kg) Urinalysis:      Fetal Status: Fetal Heart Rate (bpm): 150   Movement: Present  Presentation: Vertex  General:  Alert, oriented and cooperative. Patient is in no acute distress.  Skin: Skin is warm and dry. No rash noted.   Cardiovascular: Normal heart rate noted  Respiratory: Normal respiratory effort, no problems with respiration noted  Abdomen: Soft, gravid, appropriate for gestational age. Pain/Pressure: Absent     Pelvic:  Cervical exam performed Dilation: 3.5 Effacement (%): 70 Station: -3  Extremities:  Normal range of motion.     Mental Status: Normal mood and affect. Normal behavior. Normal judgment and thought content.   NST: 150 bpm baseline, moderate variability, 15x15 accelerations, no decelerations.  Assessment   23 y.o. G2P1001 at [redacted]w[redacted]d by  10/01/2018, by Last Menstrual Period presenting for routine prenatal visit  Plan   pregnancy Problems (from 02/02/18 to present)    Problem Noted Resolved   Anemia during pregnancy 07/14/2018 by [name], MD No   Overview Signed 07/14/2018  9:03 AM by [name], MD    Start 325mg  ferrous sulfate 07/14/2018      Elevated blood pressure affecting pregnancy in third trimester, antepartum 06/26/2018 by [name], MD No   Abnormal glucose tolerance test in pregnancy 03/13/2018 by [name], MD No   Overview Signed 06/22/2018 10:00 AM by [name], MD    Early 1-hr 149 3-hr 86, 173, 130, 98      Depression with anxiety 02/10/2018 by [name], MD No   Obesity affecting pregnancy 02/10/2018 by [name], MD No   BMI 45.0-49.9, adult [facility]) 02/10/2018 by [name], MD No   Supervision of high risk pregnancy, antepartum 02/02/2018 by [name], MD No   Overview Addendum 08/18/2018  8:30 AM by [name], MD    Clinic [facility] Prenatal Labs  Dating LMP = 6 week [place] Blood type: B/Positive/-- (11/11 1540)   Genetic Screen NIPS: 86, 173, 130, 98 Antibody:Negative (11/11 1540)  Anatomic [place] Complete on 25 week follow, low lying placenta resolved Rubella: 10.80 (11/11 1540) Varicella: Immune  GTT Early 1-hr 149 3-hr 86, 173, 130, 98  Third trimester: 87, 190, 111, 112 RPR: Non Reactive (11/11 1540)   Rhogam N/A HBsAg: Negative (11/11 1540)   TDaP vaccine   07/13/2018     Flu Shot: HIV: Non Reactive (11/11 1540)  Baby Food    Breast                           GBS:   Contraception IUD to bridge to tubal ligation IF has cesarean would like tubal ligation Signed consent  08/18/2018 Pap: 10/13/2017 NIL  CBB     CS/VBAC  hx vaginal delivery   Support Person            Pregnancy occurring while using intrauterine contraceptive device (IUD) 02/02/2018 by [name], Stephen D, MD No   Low lying placenta with hemorrhage, antepartum 06/22/2018 by [name], Andreas, MD 09/16/2018 by Tresea MallGledhill, Jane, CNM   Overview Signed 06/22/2018  8:51 AM by [name], Andreas, MD    Resolved 06/22/2018          Gestational age appropriate obstetric precautions including but not limited to vaginal bleeding, contractions, leaking of fluid and fetal movement were reviewed in detail with the patient.    Follow up on 09/24/2018 for IOL Membranes swept at maternal request.   No follow-ups on file.  Natale Milchhristanna R Schuman MD [facility], [facility] 09/21/2018, 2:41 PM

## 2018-09-21 NOTE — Therapy (Signed)
[place] MAIN [facility] SERVICES [address] [place], [place], [address] Phone: [phone]   Fax:  [phone]  Physical Therapy Treatment   The patient has been informed of current processes in place at Outpatient Rehab to protect patients from Covid-19 exposure including social distancing, schedule modifications, and new cleaning procedures. After discussing their particular risk with a therapist based on the patient's personal risk factors, the patient has decided to proceed with in-person therapy.  Patient Details  Name: Brittney Gill MRN: [id number] Date of Birth: 04/09/1995 Referring Provider (PT): Prentice Docker   Encounter Date: 09/21/2018  PT End of Session - 09/21/18 0949    Visit Number  3    Number of Visits  5    Date for PT Re-Evaluation  10/05/18    Authorization - Visit Number  3    Authorization - Number of Visits  5    PT Start Time  0845    PT Stop Time  7616    PT Time Calculation (min)  57 min    Activity Tolerance  Patient tolerated treatment well;No increased pain    Behavior During Therapy  WFL for tasks assessed/performed       Past Medical History:  Diagnosis Date  . Anxiety   . Depression     Past Surgical History:  Procedure Laterality Date  . FRACTURE SURGERY     Left  . WISDOM TOOTH EXTRACTION      There were no vitals filed for this visit.   Pelvic Floor Physical Therapy Treatment Note and Discharge Summary  SCREENING  Changes in medications, allergies, or medical history?: none    SUBJECTIVE  Patient reports: Is actually feeling pretty good today. Used SI belt when she went shopping, feels it helped her pain not increase as high.  Precautions:  [redacted] weeks pregnant.  Pain update:  Location of pain: pubic symphysis Current pain:  3/10  Max pain:  7/10 Least pain:  0/10 Nature of pain: achy  Patient Goals: No pain in pubic bone or low back   OBJECTIVE  Changes  in: Posture/Observations:  Hyperlordotic, able to actively correct with attention/cue  Palpation: TTP to B adductors and R QL, Piriformis, and OI.  Gait Analysis: ~ 25% improved posture and mechanics without cueing. 50% with cueing.  INTERVENTIONS THIS SESSION: Manual: Performed TP release to B adductors and R QL, Piriformis, and OI to decrease spasm and pain and allow for improved balance of musculature for improved function and decreased symptoms. Therex: Educated on and practiced TA in quadruped, kneeling hip-hinges and Hip EXT/ABD in side-lying and standing as well as reviewed HEP to continue to allow for improved balance surrounding the pelvis for improved stability and decreased LBP and pelvic pain during and following pregnancy.  Total time: 57 min.                             PT Short Term Goals - 09/21/18 0737      PT SHORT TERM GOAL #1   Title  Patient will demonstrate HEP x1 in the clinic to demonstrate understanding and proper form to allow for further improvement.    Baseline  Pt. lacks knowledge of therapeutic exercises that will help her decrease pain and improve function    Time  3    Period  Weeks    Status  Achieved    Target Date  09/22/18  PT SHORT TERM GOAL #2   Title  Patient will demonstrate improved pelvic alignment and balance of musculature surrounding the pelvis to facilitate decreased PFM spasms and decrease pelvic pain.    Baseline  Pt demonstrates L up-slip and spasms surrounding pelvis on L>R    Time  3    Period  Weeks    Status  Achieved    Target Date  09/22/18      PT SHORT TERM GOAL #3   Title  Patient will demonstrate improved sitting and standing posture to demonstrate learning and decrease stress on the pelvic floor with functional activity.    Baseline  Pt. demonstrates hyperlordosis with diastasis recti     Time  3    Period  Weeks    Status  Partially Met    Target Date  09/22/18        PT Long Term  Goals - 09/21/18 0937      PT LONG TERM GOAL #1   Title  Patient will describe pain no greater than 3/10 during ADL's such as cooking and cleaning to demonstrate improved functional ability.    Baseline  Pain is present at 9/10 at greatest and increased by walking, squatting, bending, etc. As of 6/29:Pain increased to 7/10 rather than 9/10 and only with a lot of walking/activity, otherwise ~ 3/10    Time  5    Period  Weeks    Status  Partially Met    Target Date  10/06/18      PT LONG TERM GOAL #2   Title  Patient will score at or below 13% on the PGQ to demonstrate a clinically significant decrease in disability and improved functional ability.    Baseline  PGQ: 48/75 (64%) as of 6/29: 18/75 (25%)    Time  5    Period  Weeks    Status  Partially Met      PT LONG TERM GOAL #3   Title  Patient will report no pain with intercourse to demonstrate improved functional ability.    Baseline  Pain both with initial penetration and deep thrusting. Pt. has not engaged in intercourse to determine if pain is present.    Time  5    Period  Weeks    Status  Unable to assess    Target Date  10/06/18      PT LONG TERM GOAL #4   Title  Patient will report having BM's at least every-other day with consistency between [facility] stool scale 3-5 over the prior week to demonstrate decreased constipation.    Baseline  Having to strain to empty bowels    Time  5    Period  Weeks    Status  Achieved    Target Date  10/06/18            Plan - 09/21/18 0949    Clinical Impression Statement  Pt. responded well to all interventions today, demonstrating decreased overall pain over the past week with the max pain at 7/10 after going shopping and average of 3/10 with ADL's which is significantly lower than baseline. She has met or made significant progress toward or met all goals and will D/C at this time due to scheduled induction before her remaining follow-up appointments.    Personal Factors and  Comorbidities  Other;Fitness   Pregnancy status   Examination-Activity Limitations  Dressing;Sit;Transfers;Bed Mobility;Sleep;Bend;Lift;Squat;Caring for Others;Locomotion Level;Stairs;Carry;Stand;Toileting    Examination-Participation Restrictions  Interpersonal Relationship;Yard Work;Cleaning;Laundry;Community Activity    Stability/Clinical  Decision Making  Evolving/Moderate complexity    Rehab Potential  Good    PT Frequency  1x / week    PT Duration  Other (comment)   5 weeks   PT Treatment/Interventions  ADLs/Self Care Home Management;Traction;Moist Heat;Gait training;Balance training;Therapeutic exercise;Therapeutic activities;Functional mobility training;Neuromuscular re-education;Patient/family education;Manual techniques;Dry needling;Taping;Spinal Manipulations;Joint Manipulations    PT Next Visit Plan  D/C    PT Home Exercise Plan  Access Code: [email or web address]     Consulted and Agree with Plan of Care  Patient       Patient will benefit from skilled therapeutic intervention in order to improve the following deficits and impairments:  Abnormal gait, Decreased balance, Decreased endurance, Difficulty walking, Increased muscle spasms, Improper body mechanics, Obesity, Decreased activity tolerance, Decreased strength, Increased fascial restricitons, Pain, Postural dysfunction  Visit Diagnosis: 1. Abnormal posture   2. Other muscle spasm        Problem List Patient Active Problem List   Diagnosis Date Noted  . Anemia during pregnancy 07/14/2018  . Elevated blood pressure affecting pregnancy in third trimester, antepartum 06/26/2018  . Abnormal glucose tolerance test in pregnancy 03/13/2018  . Depression with anxiety 02/10/2018  . Obesity affecting pregnancy 02/10/2018  . BMI 45.0-49.9, adult ([place]) 02/10/2018  . Supervision of high risk pregnancy, antepartum 02/02/2018  . Pregnancy occurring while using intrauterine contraceptive device (IUD) 02/02/2018  . Hypovitaminosis D  10/16/2017  . Depression 10/13/2017  . Anxiety 07/01/2016  . Morbid obesity ([place]) 09/23/2011   Willa Rough DPT, ATC Willa Rough 09/21/2018, 10:17 AM  [place] MAIN [facility] SERVICES [address] [place], [place], [address] Phone: [phone]   Fax:  [phone]  Name: ADELYN ROSCHER MRN: [id number] Date of Birth: Aug 12, 1995

## 2018-09-21 NOTE — Progress Notes (Signed)
ROB No concerns  Denies lof, no vb, Good FM Desires cervical check

## 2018-09-21 NOTE — Patient Instructions (Signed)
Hip Abduction (Standing)    Stand with support. Squeeze deep core and hold. Start with leg to the side/back on your toe and gently squeeze the outer hip to lift the toe off of the ground. Hold for 1 second then repeat. You should feel this in the outer hip, not the front, not your side,  And not your back. Repeat __10_ times. Do _3__ sets  a day.  HIP Extension - Standing    Stand with support. Squeeze deep core and hold. Start with leg to the back on your toe and gently squeeze the buttock to lift the toe off of the ground. Hold for 1 second then repeat. You should feel this in the outer hip, not the front, not your side,  And not your back. Repeat __10_ times. Do _3__ sets  a day.      

## 2018-09-22 ENCOUNTER — Inpatient Hospital Stay: Payer: Managed Care, Other (non HMO) | Admitting: Anesthesiology

## 2018-09-22 ENCOUNTER — Inpatient Hospital Stay
Admission: EM | Admit: 2018-09-22 | Discharge: 2018-09-23 | DRG: 807 | Disposition: A | Discharge status: Home / Self Care | Payer: Managed Care, Other (non HMO) | Attending: Obstetrics & Gynecology | Admitting: Obstetrics & Gynecology

## 2018-09-22 ENCOUNTER — Ambulatory Visit: Payer: Managed Care, Other (non HMO)

## 2018-09-22 DIAGNOSIS — Z3A38 38 weeks gestation of pregnancy: Secondary | ICD-10-CM

## 2018-09-22 DIAGNOSIS — O071 Delayed or excessive hemorrhage following failed attempted termination of pregnancy: Secondary | ICD-10-CM

## 2018-09-22 DIAGNOSIS — F418 Other specified anxiety disorders: Secondary | ICD-10-CM | POA: Diagnosis present

## 2018-09-22 DIAGNOSIS — O99344 Other mental disorders complicating childbirth: Secondary | ICD-10-CM | POA: Diagnosis present

## 2018-09-22 DIAGNOSIS — O99214 Obesity complicating childbirth: Secondary | ICD-10-CM | POA: Diagnosis present

## 2018-09-22 DIAGNOSIS — E669 Obesity, unspecified: Secondary | ICD-10-CM | POA: Diagnosis present

## 2018-09-22 DIAGNOSIS — Z1159 Encounter for screening for other viral diseases: Secondary | ICD-10-CM

## 2018-09-22 DIAGNOSIS — O26893 Other specified pregnancy related conditions, third trimester: Secondary | ICD-10-CM | POA: Diagnosis present

## 2018-09-22 DIAGNOSIS — Z87891 Personal history of nicotine dependence: Secondary | ICD-10-CM

## 2018-09-22 LAB — URINALYSIS, COMPLETE (UACMP) WITH MICROSCOPIC
Bacteria, UA: NONE SEEN
Bilirubin Urine: NEGATIVE
Glucose, UA: NEGATIVE mg/dL
Ketones, ur: NEGATIVE mg/dL
Nitrite: NEGATIVE
Protein, ur: 30 mg/dL — AB
Specific Gravity, Urine: 1.019 (ref 1.005–1.030)
pH: 5 (ref 5.0–8.0)

## 2018-09-22 LAB — CBC
HCT: 38.5 % (ref 36.0–46.0)
Hemoglobin: 12.6 g/dL (ref 12.0–15.0)
MCH: 28.1 pg (ref 26.0–34.0)
MCHC: 32.7 g/dL (ref 30.0–36.0)
MCV: 85.9 fL (ref 80.0–100.0)
Platelets: 298 10*3/uL (ref 150–400)
RBC: 4.48 MIL/uL (ref 3.87–5.11)
RDW: 14.6 % (ref 11.5–15.5)
WBC: 15.2 10*3/uL — ABNORMAL HIGH (ref 4.0–10.5)
nRBC: 0 % (ref 0.0–0.2)

## 2018-09-22 LAB — TYPE AND SCREEN
ABO/RH(D): B POS
Antibody Screen: NEGATIVE

## 2018-09-22 LAB — SARS CORONAVIRUS 2 BY RT PCR (HOSPITAL ORDER, PERFORMED IN ~~LOC~~ HOSPITAL LAB): SARS Coronavirus 2: NEGATIVE

## 2018-09-22 MED ORDER — METHYLERGONOVINE MALEATE 0.2 MG/ML IJ SOLN
INTRAMUSCULAR | Status: AC
  Administered 2018-09-22: 12:00:00
  Filled 2018-09-22: qty 1

## 2018-09-22 MED ORDER — ONDANSETRON HCL 4 MG/2ML IJ SOLN: 4.0000 mg | Freq: Four times a day (QID) | INTRAMUSCULAR | Status: DC | PRN

## 2018-09-22 MED ORDER — ACETAMINOPHEN 325 MG PO TABS
ORAL_TABLET | ORAL | Status: AC
  Filled 2018-09-22: qty 2

## 2018-09-22 MED ORDER — OXYTOCIN 40 UNITS IN NORMAL SALINE INFUSION - SIMPLE MED
2.5000 [IU]/h | INTRAVENOUS | Status: DC
  Filled 2018-09-22: qty 1000

## 2018-09-22 MED ORDER — OXYTOCIN BOLUS FROM INFUSION
500.0000 mL | Freq: Once | INTRAVENOUS | Status: AC
  Administered 2018-09-22: 500 mL via INTRAVENOUS

## 2018-09-22 MED ORDER — PRENATAL MULTIVITAMIN CH
1.0000 | ORAL_TABLET | Freq: Every day | ORAL | Status: DC
  Administered 2018-09-23: 1 via ORAL
  Filled 2018-09-22: qty 1

## 2018-09-22 MED ORDER — BENZOCAINE-MENTHOL 20-0.5 % EX AERO: 1.0000 "application " | INHALATION_SPRAY | CUTANEOUS | Status: DC | PRN

## 2018-09-22 MED ORDER — LIDOCAINE-EPINEPHRINE (PF) 1.5 %-1:200000 IJ SOLN
INTRAMUSCULAR | Status: DC | PRN
  Administered 2018-09-22: 3 mL via PERINEURAL

## 2018-09-22 MED ORDER — PHENYLEPHRINE 40 MCG/ML (10ML) SYRINGE FOR IV PUSH (FOR BLOOD PRESSURE SUPPORT)
80.0000 ug | PREFILLED_SYRINGE | INTRAVENOUS | Status: DC | PRN
  Filled 2018-09-22: qty 10

## 2018-09-22 MED ORDER — COCONUT OIL OIL: 1.0000 "application " | TOPICAL_OIL | Status: DC | PRN

## 2018-09-22 MED ORDER — DIBUCAINE (PERIANAL) 1 % EX OINT: 1.0000 "application " | TOPICAL_OINTMENT | CUTANEOUS | Status: DC | PRN

## 2018-09-22 MED ORDER — BUTORPHANOL TARTRATE 1 MG/ML IJ SOLN: 1.0000 mg | INTRAMUSCULAR | Status: DC | PRN

## 2018-09-22 MED ORDER — SIMETHICONE 80 MG PO CHEW: 80.0000 mg | CHEWABLE_TABLET | ORAL | Status: DC | PRN

## 2018-09-22 MED ORDER — METHYLERGONOVINE MALEATE 0.2 MG/ML IJ SOLN: 0.2000 mg | Freq: Once | INTRAMUSCULAR | Status: AC

## 2018-09-22 MED ORDER — EPHEDRINE 5 MG/ML INJ
10.0000 mg | INTRAVENOUS | Status: DC | PRN
  Filled 2018-09-22: qty 2

## 2018-09-22 MED ORDER — CITALOPRAM HYDROBROMIDE 10 MG PO TABS
10.0000 mg | ORAL_TABLET | Freq: Every day | ORAL | Status: DC
  Filled 2018-09-22: qty 1

## 2018-09-22 MED ORDER — LACTATED RINGERS IV SOLN
INTRAVENOUS | Status: DC
  Administered 2018-09-22 (×2): via INTRAVENOUS

## 2018-09-22 MED ORDER — FENTANYL 2.5 MCG/ML W/ROPIVACAINE 0.15% IN NS 100 ML EPIDURAL (ARMC)
EPIDURAL | Status: DC | PRN
  Administered 2018-09-22: 12 mL/h via EPIDURAL

## 2018-09-22 MED ORDER — LACTATED RINGERS IV SOLN: 500.0000 mL | INTRAVENOUS | Status: DC | PRN

## 2018-09-22 MED ORDER — FERROUS SULFATE 325 (65 FE) MG PO TABS
325.0000 mg | ORAL_TABLET | Freq: Every day | ORAL | Status: DC
  Administered 2018-09-23: 325 mg via ORAL
  Filled 2018-09-22: qty 1

## 2018-09-22 MED ORDER — ACETAMINOPHEN 325 MG PO TABS
650.0000 mg | ORAL_TABLET | ORAL | Status: DC | PRN
  Administered 2018-09-22: 650 mg via ORAL

## 2018-09-22 MED ORDER — MISOPROSTOL 200 MCG PO TABS
ORAL_TABLET | ORAL | Status: AC
  Administered 2018-09-22: 12:00:00
  Filled 2018-09-22: qty 4

## 2018-09-22 MED ORDER — IBUPROFEN 600 MG PO TABS
600.0000 mg | ORAL_TABLET | Freq: Four times a day (QID) | ORAL | Status: DC
  Administered 2018-09-23 (×3): 600 mg via ORAL
  Filled 2018-09-22 (×4): qty 1

## 2018-09-22 MED ORDER — FENTANYL 2.5 MCG/ML W/ROPIVACAINE 0.15% IN NS 100 ML EPIDURAL (ARMC)
EPIDURAL | Status: AC
  Filled 2018-09-22: qty 100

## 2018-09-22 MED ORDER — AMMONIA AROMATIC IN INHA
RESPIRATORY_TRACT | Status: AC
  Filled 2018-09-22: qty 10

## 2018-09-22 MED ORDER — DIPHENHYDRAMINE HCL 50 MG/ML IJ SOLN: 12.5000 mg | INTRAMUSCULAR | Status: DC | PRN

## 2018-09-22 MED ORDER — LACTATED RINGERS IV SOLN
500.0000 mL | Freq: Once | INTRAVENOUS | Status: AC
  Administered 2018-09-22: 08:00:00 500 mL via INTRAVENOUS

## 2018-09-22 MED ORDER — MISOPROSTOL 200 MCG PO TABS
ORAL_TABLET | ORAL | Status: AC
  Filled 2018-09-22: qty 4

## 2018-09-22 MED ORDER — WITCH HAZEL-GLYCERIN EX PADS: 1.0000 "application " | MEDICATED_PAD | CUTANEOUS | Status: DC | PRN

## 2018-09-22 MED ORDER — BUPROPION HCL ER (XL) 150 MG PO TB24
150.0000 mg | ORAL_TABLET | Freq: Every day | ORAL | Status: DC
  Administered 2018-09-23: 150 mg via ORAL
  Filled 2018-09-22: qty 1

## 2018-09-22 MED ORDER — OXYTOCIN 10 UNIT/ML IJ SOLN
INTRAMUSCULAR | Status: AC
  Filled 2018-09-22: qty 2

## 2018-09-22 MED ORDER — ONDANSETRON HCL 4 MG PO TABS: 4.0000 mg | ORAL_TABLET | ORAL | Status: DC | PRN

## 2018-09-22 MED ORDER — OXYTOCIN 40 UNITS IN NORMAL SALINE INFUSION - SIMPLE MED
INTRAVENOUS | Status: AC
  Filled 2018-09-22: qty 1000

## 2018-09-22 MED ORDER — BUPIVACAINE HCL (PF) 0.25 % IJ SOLN
INTRAMUSCULAR | Status: DC | PRN
  Administered 2018-09-22 (×2): 5 mL via EPIDURAL

## 2018-09-22 MED ORDER — MISOPROSTOL 200 MCG PO TABS: 800.0000 ug | ORAL_TABLET | Freq: Once | ORAL | Status: AC

## 2018-09-22 MED ORDER — LIDOCAINE HCL (PF) 1 % IJ SOLN: 30.0000 mL | INTRAMUSCULAR | Status: DC | PRN

## 2018-09-22 MED ORDER — OXYCODONE HCL 5 MG PO TABS: 5.0000 mg | ORAL_TABLET | ORAL | Status: DC | PRN

## 2018-09-22 MED ORDER — LIDOCAINE HCL (PF) 1 % IJ SOLN
INTRAMUSCULAR | Status: AC
  Filled 2018-09-22: qty 30

## 2018-09-22 MED ORDER — FENTANYL-BUPIVACAINE-NACL 0.5-0.125-0.9 MG/250ML-% EP SOLN
12.0000 mL/h | EPIDURAL | Status: DC | PRN
  Filled 2018-09-22: qty 250

## 2018-09-22 MED ORDER — LIDOCAINE HCL (PF) 1 % IJ SOLN
INTRAMUSCULAR | Status: DC | PRN
  Administered 2018-09-22: 3 mL

## 2018-09-22 MED ORDER — SENNOSIDES-DOCUSATE SODIUM 8.6-50 MG PO TABS
2.0000 | ORAL_TABLET | ORAL | Status: DC
  Administered 2018-09-23: 2 via ORAL
  Filled 2018-09-22: qty 2

## 2018-09-22 MED ORDER — ONDANSETRON HCL 4 MG/2ML IJ SOLN: 4.0000 mg | INTRAMUSCULAR | Status: DC | PRN

## 2018-09-22 NOTE — Anesthesia Procedure Notes (Signed)
Epidural Patient location during procedure: OB Start time: 09/22/2018 8:15 AM End time: 09/22/2018 8:35 AM  Staffing Resident/CRNA: [name], CRNA Performed: resident/CRNA   Preanesthetic Checklist Completed: patient identified, site marked, surgical consent, pre-op evaluation, timeout performed, IV checked, risks and benefits discussed and monitors and equipment checked  Epidural Patient position: sitting Prep: Betadine Patient monitoring: heart rate, continuous pulse ox and blood pressure Approach: midline Location: L4-L5 Injection technique: LOR air  Needle:  Needle type: Tuohy  Needle gauge: 17 G Needle length: 9 cm and 9 Catheter type: closed end flexible Catheter size: 20 Guage Test dose: negative and 1.5% lidocaine with Epi 1:200 K  Assessment Sensory level: T10 Events: blood not aspirated, injection not painful, no injection resistance, negative IV test and no paresthesia  Additional Notes   Patient tolerated the insertion well without complications.Reason for block:procedure for pain

## 2018-09-22 NOTE — Plan of Care (Signed)
Pt admitted for labor and had successful vaginal delivery at 1213. Education reviewed with pt and all questions answered. Pt transferred to mother baby with infant for couplet care.

## 2018-09-22 NOTE — H&P (Signed)
OB History & Physical   History of Present Illness:  Chief Complaint: contractions  HPI:  Brittney Gill is a 23 y.o. G21P1001 female at [redacted]w[redacted]d dated by LMP.  Her pregnancy has been complicated by obesity with BMI greater than 40, depression with anxiety, pregnancy occuring while using an IUD, elevated blood pressure in 3rd trimester, anemia.    She reports contractions since 4 am with loss of mucous plug. She was swept in the office yesterday and was 3 cm at that time.   She denies leakage of fluid.   She denies vaginal bleeding.  She reports fetal movement.    Maternal Medical History:   Past Medical History:  Diagnosis Date  . Anxiety   . Depression     Past Surgical History:  Procedure Laterality Date  . FRACTURE SURGERY     Left  . WISDOM TOOTH EXTRACTION      Allergies  Allergen Reactions  . Other Itching    Vitamin E cream  . Diflucan [Fluconazole] Rash    Prior to Admission medications   Medication Sig Start Date End Date Taking? Authorizing Provider  aspirin EC 81 MG tablet Take by mouth.   Yes [provider]  buPROPion (WELLBUTRIN XL) 150 MG 24 hr tablet Take 1 tablet (150 mg total) by mouth daily. 06/12/18  Yes [name], MD  citalopram (CELEXA) 10 MG tablet Take 1 tablet (10 mg total) by mouth daily. 01/12/18  Yes [name], MD  docusate sodium (COLACE) 100 MG capsule Take 100 mg by mouth 2 (two) times daily.   Yes [provider]  famotidine (PEPCID) 20 MG tablet Take 20 mg by mouth 2 (two) times daily.   Yes [provider]  ferrous sulfate (FERROUSUL) 325 (65 FE) MG tablet Take 1 tablet (325 mg total) by mouth daily with breakfast. 07/14/18   [name], MD  omeprazole (PRILOSEC) 10 MG capsule Take 10 mg by mouth daily.    [provider]  Prenatal Vit-Fe Fumarate-FA (PRENATAL VITAMIN PO) Take by mouth.    [provider]  sertraline (ZOLOFT) 50 MG tablet Take 1 tablet (50 mg total) by mouth  daily. Patient not taking: Reported on 09/15/2018 06/12/18   [name], MD  vitamin C (ASCORBIC ACID) 500 MG tablet Take 500 mg by mouth daily.    [provider]  Vitamin D, Ergocalciferol, (DRISDOL) 50000 units CAPS capsule Take 1 capsule (50,000 Units total) by mouth every 7 (seven) days. Patient not taking: Reported on 07/13/2018 10/16/17   [name], MD    OB History  Gravida Para Term Preterm AB Living  2 1 1     1   SAB TAB Ectopic Multiple Live Births          1    # Outcome Date GA Lbr Len/2nd Weight Sex Delivery Anes PTL Lv  2 Current           1 Term             Prenatal care site: [facility]  Social History: She  reports that she quit smoking about 8 months ago. Her smoking use included cigarettes. She started smoking about 2 years ago. She smoked 0.50 packs per day. She has never used smokeless tobacco. She reports that she does not drink alcohol or use drugs.  Family History: family history is not on file.    Review of Systems:  Review of Systems  Constitutional: Negative.  HENT: Negative.   Eyes: Negative.   Respiratory: Negative.   Cardiovascular: Negative.   Gastrointestinal: Negative.   Genitourinary: Negative.   Musculoskeletal: Negative.   Skin: Negative.   Neurological: Negative.   Endo/Heme/Allergies: Negative.   Psychiatric/Behavioral: Negative.      Physical Exam:  Vital Signs: BP 132/78 (BP Location: Right Arm)   Pulse 95   Temp 98 F (36.7 C) (Oral)   Resp 18   Ht 5\' 5"  (1.651 m)   Wt 132.9 kg   LMP 12/25/2017   BMI 48.76 kg/m  Constitutional: Well nourished, well developed female in no acute distress.  HEENT: normal Skin: Warm and dry.  Cardiovascular: Regular rate and rhythm.   Extremity: trace edema  Respiratory: Clear to auscultation bilateral. Normal respiratory effort Abdomen: FHT present Back: no CVAT Neuro: DTRs 2+, Cranial nerves grossly intact Psych: Alert and Oriented x3. No memory deficits.  Normal mood and affect.  MS: normal gait, normal bilateral lower extremity ROM/strength/stability.  Pelvic exam: by RN 4.5 cm with change to 7 cm in 1 hour while in triage    Pertinent Results:  Prenatal Labs: Blood type/Rh B positive  Antibody screen negative  Rubella Immune  Varicella Immune    RPR Non-reactive  HBsAg negative  HIV negative  GC negative  Chlamydia negative  Genetic screening Negative/xx  1 hour GTT Elevated early 1 hour  3 hour GTT Normal early 3 hour, 3rd trimester 3/4 normal  GBS negative on 6/16   Baseline FHR: 140 beats/min   Variability: moderate   Accelerations: present   Decelerations: absent Contractions: present frequency: 2-3 Overall assessment: reassuring    No results found for: SARSCOV2NAA] pending  Assessment:  Brittney Gill is a 23 y.o. 932P1001 female at 5024w5d with active labor.   Plan:  1. Admit to Labor & Delivery  2. CBC, T&S, Clrs, IVF 3. GBS negative.   4. Fetal well-being: Category I 5. Expectant management, epidural as able  Tresea MallJane Taziah Difatta, CNM 09/22/2018 7:32 AM

## 2018-09-22 NOTE — Discharge Summary (Signed)
OB Discharge Summary     Patient Name: Brittney Gill DOB: January 07, 1996 MRN: [id number]  Date of admission: 09/22/2018 Delivering MD: Prentice Docker, MD  Date of Delivery: 09/22/2018  Date of discharge: 09/23/2018  Admitting diagnosis: 69 Weeks Contractions Intrauterine pregnancy: [redacted]w[redacted]d     Secondary diagnosis: None     Discharge diagnosis: Term Pregnancy Delivered                                                                                                Post partum procedures:none  Augmentation: AROM  Complications: None  Hospital course:  Onset of Labor With Vaginal Delivery     23 y.o. yo [id number] at [redacted]w[redacted]d was admitted in Active Labor on 09/22/2018. Patient had an uncomplicated labor course as follows:  Membrane Rupture Time/Date: 10:38 AM ,09/22/2018   Intrapartum Procedures: Episiotomy: None [1]                                         Lacerations:  1st degree [2];Vaginal [6]  Patient had a delivery of a Viable infant. 09/22/2018  Information for the patient's newborn:  Kermit Balo Girl Romonda [[id number]]  Delivery Method: Vag-Spont     Pateint had an uncomplicated postpartum course.  She is ambulating, tolerating a regular diet, passing flatus, and urinating well. Patient is discharged home in stable condition on 09/23/18.   Physical exam  Vitals:   09/23/18 0026 09/23/18 0325 09/23/18 0756 09/23/18 1218  BP: (!) 103/55 103/70 122/70 130/86  Pulse: 79 75 68 78  Resp: 16 16 18 20   Temp: 98.7 F (37.1 C) 98.5 F (36.9 C) 98.1 F (36.7 C) 98.7 F (37.1 C)  TempSrc: Oral Oral Oral   SpO2: 100% 98% 99% 98%  Weight:      Height:       General: alert, cooperative and no distress Lochia: appropriate Uterine Fundus: firm/ U/ ML/ NT DVT Evaluation: No evidence of DVT seen on physical exam.  Labs: Lab Results  Component Value Date   WBC 13.4 (H) 09/23/2018   HGB 12.1 09/23/2018   HCT 37.4 09/23/2018   MCV 87.8 09/23/2018   PLT 263 09/23/2018    Discharge instruction: per  After Visit Summary.  Medications:  Allergies as of 09/23/2018      Reactions   Other Itching   Vitamin E cream   Diflucan [fluconazole] Rash      Medication List    STOP taking these medications   aspirin EC 81 MG tablet   citalopram 10 MG tablet Commonly known as: CeleXA   ferrous sulfate 325 (65 FE) MG tablet Commonly known as: FerrouSul   vitamin C 500 MG tablet Commonly known as: ASCORBIC ACID     TAKE these medications   buPROPion 150 MG 24 hr tablet Commonly known as: Wellbutrin XL Take 1 tablet (150 mg total) by mouth daily.   docusate sodium 100 MG capsule Commonly known as: COLACE Take 100 mg by mouth 2 (two) times daily.   famotidine 20  MG tablet Commonly known as: PEPCID Take 20 mg by mouth 2 (two) times daily.   ibuprofen 600 MG tablet Commonly known as: ADVIL Take 1 tablet (600 mg total) by mouth every 6 (six) hours.   omeprazole 10 MG capsule Commonly known as: PRILOSEC Take 10 mg by mouth daily.   PRENATAL VITAMIN PO Take by mouth.   sertraline 50 MG tablet Commonly known as: ZOLOFT Take 1 tablet (50 mg total) by mouth daily. Start taking on: September 24, 2018       Diet: routine diet  Activity: Advance as tolerated. Pelvic rest for 6 weeks.   Outpatient follow up: Follow-up Information    [name], Stephen D, MD. Schedule an appointment as soon as possible for a visit.   Specialty: Obstetrics and Gynecology Why: for 2 week check on mental health Contact information: [address] [place] [address] [phone]             Postpartum contraception: IUD Mirena Rhogam Given postpartum: no Rubella vaccine given postpartum: no Varicella vaccine given postpartum: no TDaP given antepartum or postpartum: 07/13/2018  Newborn Data: Live born female Wylene MenLacey Birth Weight: 8#5.3oz  APGAR: 39, 9  Newborn Delivery   Birth date/time: 09/22/2018 12:13:00 Delivery type: Vaginal, Spontaneous       Baby Feeding:  Breast  Disposition:home with mother  SIGNED: Farrel Connersolleen Lilianna Case, CNM

## 2018-09-22 NOTE — Progress Notes (Signed)
Labor Check  Subj:  Complaints: comfortable with epidural   Obj:  BP (!) 104/59   Pulse 93   Temp 97.8 F (36.6 C) (Oral)   Resp 20   Ht 5\' 5"  (1.651 m)   Wt 132.9 kg   LMP 12/25/2017   SpO2 100%   BMI 48.76 kg/m     Cervix: Dilation: 10 / Effacement (%): 100 / Station: -1   AROM: thin-moderate meconium Baseline FHR: 145 beats/min   Variability: moderate   Accelerations: present   Decelerations: absent Contractions: present frequency: 2-3 q 10 min (not tracing well) Overall assessment: cat 1  A/P: 23 y.o. G2P1001 female at [redacted]w[redacted]d with active, normal labor.  1.  Labor: AROM, otherwise expectant management  2.  FWB: reassuring.  Thin-moderate meconium on AROM. Notify peds, Overall assessment: category 1  3.  GBS negative 6/16  4.  Pain: epidural 5.  Recheck: prn   Prentice Docker, MD, [facility], [facility] 09/22/2018 11:01 AM

## 2018-09-22 NOTE — OB Triage Note (Signed)
Patient came in for observation for labor evaluation. Patient reports contractions every five minutes since 0400. Patient rates pain 8/10.  Patient denies leaking of fluid and denies vaginal bleeding and spotting. Patient reports +FM upon arrival to [facility]. Vital signs stable and patient afebrile. FHR baseline 135 with moderate variability with accelerations 15 x 15 and no decelerations. Patient in room by herself. Will continue to monitor.

## 2018-09-22 NOTE — Discharge Instructions (Signed)
°Vaginal Delivery, Care After °Refer to this sheet in the next few weeks. These discharge instructions provide you with information on caring for yourself after delivery. Your caregiver may also give you specific instructions. Your treatment has been planned according to the most current medical practices available, but problems sometimes occur. Call your caregiver if you have any problems or questions after you go home. °HOME CARE INSTRUCTIONS °1. Take over-the-counter or prescription medicines only as directed by your caregiver or pharmacist. °2. Do not drink alcohol, especially if you are breastfeeding or taking medicine to relieve pain. °3. Do not smoke tobacco. °4. Continue to use good perineal care. Good perineal care includes: °1. Wiping your perineum from back to front °2. Keeping your perineum clean. °3. You can do sitz baths twice a day, to help keep this area clean °5. Do not use tampons, douche or have sex for 6 weeks °6. Shower only and avoid sitting in submerged water, aside from sitz baths °7. Wear a well-fitting bra that provides breast support. °8. Eat healthy foods. °9. Drink enough fluids to keep your urine clear or pale yellow. °10. Eat high-fiber foods such as whole grain cereals and breads, brown rice, beans, and fresh fruits and vegetables every day. These foods may help prevent or relieve constipation. °11. Avoid constipation with high fiber foods or medications, such as miralax or metamucil °12. Follow your caregiver's recommendations regarding resumption of activities such as climbing stairs, driving, lifting, exercising, or traveling. °13. Talk to your caregiver about resuming sexual activities. Resumption of sexual activities after 6 weeks is dependent upon your risk of infection, your rate of healing, and your comfort and desire to resume sexual activity. °14. Try to have someone help you with your household activities and your newborn for at least a few days after you leave the  hospital. °15. Rest as much as possible. Try to rest or take a nap when your newborn is sleeping. °16. Increase your activities gradually. °17. Keep all of your scheduled postpartum appointments. It is very important to keep your scheduled follow-up appointments. At these appointments, your caregiver will be checking to make sure that you are healing physically and emotionally. °SEEK MEDICAL CARE IF:  °· You are passing large clots from your vagina. Save any clots to show your caregiver. °· You have a foul smelling discharge from your vagina. °· You have trouble urinating. °· You are urinating frequently. °· You have pain when you urinate. °· You have a change in your bowel movements. °· You have increasing redness, pain, or swelling near your vaginal incision (episiotomy) or vaginal tear. °· You have pus draining from your episiotomy or vaginal tear. °· Your episiotomy or vaginal tear is separating. °· You have painful, hard, or reddened breasts. °· You have a severe headache. °· You have blurred vision or see spots. °· You feel sad or depressed. °· You have thoughts of hurting yourself or your newborn. °· You have questions about your care, the care of your newborn, or medicines. °· You are dizzy or light-headed. °· You have a rash. °· You have nausea or vomiting. °· You were breastfeeding and have not had a menstrual period within 12 weeks after you stopped breastfeeding. °· You are not breastfeeding and have not had a menstrual period by the 12th week after delivery. °· You have a fever of 100.5 or more °SEEK IMMEDIATE MEDICAL CARE IF:  °· You have persistent pain. °· You have chest pain. °· You have shortness   of breath. °· You faint. °· You have leg pain. °· You have stomach pain. °· Your vaginal bleeding saturates two or more sanitary pads in 1 hour. °MAKE SURE YOU:  °· Understand these instructions. °· Will watch your condition. °· Will get help right away if you are not doing well or get worse. °Document  Released: 03/08/2000 Document Revised: 07/26/2013 Document Reviewed: 11/06/2011 °ExitCare® Patient Information ©2015 [employer]. This information is not intended to replace advice given to you by your health care provider. Make sure you discuss any questions you have with your health care provider. ° °Sitz Bath °A sitz bath is a warm water bath taken in the sitting position. The water covers only the hips and butt (buttocks). We recommend using one that fits in the toilet, to help with ease of use and cleanliness. It may be used for either healing or cleaning purposes. Sitz baths are also used to relieve pain, itching, or muscle tightening (spasms). The water may contain medicine. Moist heat will help you heal and relax.  °HOME CARE  °Take 3 to 4 sitz baths a day. °18. Fill the bathtub half-full with warm water. °19. Sit in the water and open the drain a little. °20. Turn on the warm water to keep the tub half-full. Keep the water running constantly. °21. Soak in the water for 15 to 20 minutes. °22. After the sitz bath, pat the affected area dry. °GET HELP RIGHT AWAY IF: °You get worse instead of better. Stop the sitz baths if you get worse. °MAKE SURE YOU: °· Understand these instructions. °· Will watch your condition. °· Will get help right away if you are not doing well or get worse. °Document Released: 04/18/2004 Document Revised: 12/04/2011 Document Reviewed: 07/09/2010 °ExitCare® Patient Information ©2015 [employer]. This information is not intended to replace advice given to you by your health care provider. Make sure you discuss any questions you have with your health care provider. ° ° °

## 2018-09-22 NOTE — Anesthesia Preprocedure Evaluation (Signed)
Anesthesia Evaluation  Patient identified by MRN, date of birth, ID band Patient awake    Reviewed: Allergy & Precautions, H&P , NPO status , Patient's Chart, lab work & pertinent test results  History of Anesthesia Complications Negative for: history of anesthetic complications  Airway Mallampati: II  TM Distance: >3 FB Neck ROM: full    Dental no notable dental hx.    Pulmonary neg pulmonary ROS, former smoker,    Pulmonary exam normal        Cardiovascular negative cardio ROS Normal cardiovascular exam     Neuro/Psych negative neurological ROS  negative psych ROS   GI/Hepatic negative GI ROS, Neg liver ROS,   Endo/Other  negative endocrine ROS  Renal/GU negative Renal ROS  negative genitourinary   Musculoskeletal   Abdominal   Peds  Hematology negative hematology ROS (+)   Anesthesia Other Findings   Reproductive/Obstetrics (+) Pregnancy                             Anesthesia Physical Anesthesia Plan  ASA: II  Anesthesia Plan:    Post-op Pain Management:    Induction:   PONV Risk Score and Plan:   Airway Management Planned:   Additional Equipment:   Intra-op Plan:   Post-operative Plan:   Informed Consent: I have reviewed the patients History and Physical, chart, labs and discussed the procedure including the risks, benefits and alternatives for the proposed anesthesia with the patient or authorized representative who has indicated his/her understanding and acceptance.       Plan Discussed with: Anesthesiologist and CRNA  Anesthesia Plan Comments:         Anesthesia Quick Evaluation  

## 2018-09-23 LAB — CBC
HCT: 37.4 % (ref 36.0–46.0)
Hemoglobin: 12.1 g/dL (ref 12.0–15.0)
MCH: 28.4 pg (ref 26.0–34.0)
MCHC: 32.4 g/dL (ref 30.0–36.0)
MCV: 87.8 fL (ref 80.0–100.0)
Platelets: 263 10*3/uL (ref 150–400)
RBC: 4.26 MIL/uL (ref 3.87–5.11)
RDW: 14.7 % (ref 11.5–15.5)
WBC: 13.4 10*3/uL — ABNORMAL HIGH (ref 4.0–10.5)
nRBC: 0 % (ref 0.0–0.2)

## 2018-09-23 LAB — RPR: RPR Ser Ql: NONREACTIVE

## 2018-09-23 MED ORDER — SERTRALINE HCL 50 MG PO TABS: 50.0000 mg | ORAL_TABLET | Freq: Every day | ORAL | Status: DC

## 2018-09-23 MED ORDER — IBUPROFEN 600 MG PO TABS: 600.0000 mg | ORAL_TABLET | Freq: Four times a day (QID) | ORAL | 0 refills | Status: DC

## 2018-09-23 MED ORDER — SERTRALINE HCL 25 MG PO TABS
50.0000 mg | ORAL_TABLET | Freq: Every day | ORAL | Status: DC
  Administered 2018-09-23: 50 mg via ORAL
  Filled 2018-09-23: qty 2

## 2018-09-23 NOTE — Progress Notes (Signed)
Pt discharged with infant. Discharge instructions, prescriptions, and follow up appointments given to and reviewed with patient. Pt verbalized understanding. Escorted out by staff. 

## 2018-09-23 NOTE — Progress Notes (Signed)
RN in room to do fundal check on pt; fundal check and bleeding WNL; RN asked pt if baby has breastfed since 2315; pt said no "she's been sleeping"; RN reminded pt of hunger cues (licking lips, smacking lips, rooting around, hands to mouth, crying); RN also suggested that if baby doesn't give any hunger cues in the next 30 min to hour, pt could check baby's diaper and then see if when baby more aroused if baby gives hunger cues; pt nodded ok

## 2018-09-23 NOTE — Anesthesia Postprocedure Evaluation (Signed)
Anesthesia Post Note  Patient: Brittney Gill  Procedure(s) Performed: AN AD HOC LABOR EPIDURAL  Patient location during evaluation: Mother Baby Anesthesia Type: Spinal Level of consciousness: oriented and awake and alert Pain management: pain level controlled Vital Signs Assessment: post-procedure vital signs reviewed and stable Respiratory status: spontaneous breathing and respiratory function stable Cardiovascular status: blood pressure returned to baseline and stable Postop Assessment: no headache, no backache, no apparent nausea or vomiting and able to ambulate Anesthetic complications: no     Last Vitals:  Vitals:   09/23/18 0325 09/23/18 0756  BP: 103/70 122/70  Pulse: 75 68  Resp: 16 18  Temp: 36.9 C 36.7 C  SpO2: 98% 99%    Last Pain:  Vitals:   09/23/18 0756  TempSrc: Oral  PainSc:                  Alison Stalling

## 2018-09-23 NOTE — Lactation Note (Signed)
This note was copied from a baby's chart. Lactation Consultation Note  Patient Name: Brittney Gill How Today's Date: 09/23/2018     Maternal Data    Feeding    LATCH Score                   Interventions    Lactation Tools Discussed/Used     Consult Status  Mother states that breastfeeding is going well. She has a nipple shield that she has brought from home. Mother mentions that her right nipple is sore but is not experiencing trauma to the nipple. Mother is concerned that the nipple shield is too big so LC gave her a smaller size. Mother was given information on the breastfeeding support group (virtual) along with virtual lactation consultations available if needed upon discharge. Mother has no further concerns at this time.    Elvera Lennox 04/28/4693, 4:02 PM

## 2018-09-23 NOTE — Plan of Care (Signed)
Vs stable; up ad lib; tolerating regular diet; taking motrin for pain control; pt asked about going home today; breastfeeding and has asked for no assistance this shift; RN did see one breastfeed where baby was at left breast in cradle position and pt using own nipple shield that she brought from home

## 2018-09-23 NOTE — Progress Notes (Signed)
Post Partum Day 1 Subjective: no complaints, up ad lib, voiding and tolerating PO. Wants to go home  Objective: Blood pressure 130/86, pulse 78, temperature 98.7 F (37.1 C), resp. rate 20, height 5\' 5"  (1.651 m), weight 132.9 kg, last menstrual period 12/25/2017, SpO2 98 %, unknown if currently breastfeeding.  Physical Exam:  General: alert, cooperative and no distress Lochia: appropriate Uterine Fundus: firm/ U/ ML/NT DVT Evaluation: No evidence of DVT seen on physical exam.  Recent Labs    09/22/18 0732 09/23/18 0519  HGB 12.6 12.1  HCT 38.5 37.4  WBC 15.2* 13.4*  PLT 298 263    Assessment/Plan: Stable PPD #1 Discharge home with instructions B POS/ RI/VI Breast TDAP 07/13/18 Contraception: Mirena IUD until lap BTL  Dalia Heading, CNM     LOS: 1 day   Dalia Heading 09/23/2018, 12:39 PM

## 2018-10-20 ENCOUNTER — Telehealth: Payer: Self-pay | Admitting: Obstetrics and Gynecology

## 2018-10-20 NOTE — Telephone Encounter (Signed)
Patient is schedule 11/05/18 with [facility] at 9:30 for  Weeks PP and Mirena insertion

## 2018-10-21 NOTE — Telephone Encounter (Signed)
Noted. Will order to arrive by apt date/time. 

## 2018-10-24 ENCOUNTER — Telehealth: Payer: Managed Care, Other (non HMO) | Admitting: Physician Assistant

## 2018-10-24 DIAGNOSIS — Z20822 Contact with and (suspected) exposure to covid-19: Secondary | ICD-10-CM

## 2018-10-24 NOTE — Progress Notes (Signed)
E-Visit for Corona Virus Screening   Your current symptoms could be consistent with the coronavirus.  Many health care providers can now test patients at their office but not all are.  [facility] has multiple testing sites. For information on our COVID testing locations and hours go to [email or web address]  Please quarantine yourself while awaiting your test results.  We are enrolling you in our MyChart Home Montioring for COVID19 . Daily you will receive a questionnaire within the MyChart website. Our COVID 19 response team willl be monitoriing your responses daily.    COVID-19 is a respiratory illness with symptoms that are similar to the flu. Symptoms are typically mild to moderate, but there have been cases of severe illness and death due to the virus. The following symptoms may appear 2-14 days after exposure: . Fever . Cough . Shortness of breath or difficulty breathing . Chills . Repeated shaking with chills . Muscle pain . Headache . Sore throat . New loss of taste or smell . Fatigue . Congestion or runny nose . Nausea or vomiting . Diarrhea  It is vitally important that if you feel that you have an infection such as this virus or any other virus that you stay home and away from places where you may spread it to others.  You should self-quarantine for 14 days if you have symptoms that could potentially be coronavirus or have been in close contact a with a person diagnosed with COVID-19 within the last 2 weeks. You should avoid contact with people age 65 and older.   You should wear a mask or cloth face covering over your nose and mouth if you must be around other people or animals, including pets (even at home). Try to stay at least 6 feet away from other people. This will protect the people around you.  You may also take acetaminophen (Tylenol) as needed for fever.   Reduce your risk of any infection by using the same precautions used for avoiding the  common cold or flu:  . Wash your hands often with soap and warm water for at least 20 seconds.  If soap and water are not readily available, use an alcohol-based hand sanitizer with at least 60% alcohol.  . If coughing or sneezing, cover your mouth and nose by coughing or sneezing into the elbow areas of your shirt or coat, into a tissue or into your sleeve (not your hands). . Avoid shaking hands with others and consider head nods or verbal greetings only. . Avoid touching your eyes, nose, or mouth with unwashed hands.  . Avoid close contact with people who are sick. . Avoid places or events with large numbers of people in one location, like concerts or sporting events. . Carefully consider travel plans you have or are making. . If you are planning any travel outside or inside the [place], visit the CDC's Travelers' Health webpage for the latest health notices. . If you have some symptoms but not all symptoms, continue to monitor at home and seek medical attention if your symptoms worsen. . If you are having a medical emergency, call 911.  HOME CARE . Only take medications as instructed by your medical team. . Drink plenty of fluids and get plenty of rest. . A steam or ultrasonic humidifier can help if you have congestion.   GET HELP RIGHT AWAY IF YOU HAVE EMERGENCY WARNING SIGNS** FOR COVID-19. If you or someone is showing any of these signs seek emergency medical care immediately. Call   911 or proceed to your closest emergency facility if: . You develop worsening high fever. . Trouble breathing . Bluish lips or face . Persistent pain or pressure in the chest . New confusion . Inability to wake or stay awake . You cough up blood. . Your symptoms become more severe  **This list is not all possible symptoms. Contact your medical provider for any symptoms that are sever or concerning to you.   MAKE SURE YOU   Understand these instructions.  Will watch your condition.  Will get help right  away if you are not doing well or get worse.  Your e-visit answers were reviewed by a board certified advanced clinical practitioner to complete your personal care plan.  Depending on the condition, your plan could have included both over the counter or prescription medications.  If there is a problem please reply once you have received a response from your provider.  Your safety is important to us.  If you have drug allergies check your prescription carefully.    You can use MyChart to ask questions about today's visit, request a non-urgent call back, or ask for a work or school excuse for 24 hours related to this e-Visit. If it has been greater than 24 hours you will need to follow up with your provider, or enter a new e-Visit to address those concerns. You will get an e-mail in the next two days asking about your experience.  I hope that your e-visit has been valuable and will speed your recovery. Thank you for using e-visits.    

## 2018-11-05 ENCOUNTER — Encounter: Payer: Self-pay | Admitting: Obstetrics and Gynecology

## 2018-11-05 ENCOUNTER — Ambulatory Visit (INDEPENDENT_AMBULATORY_CARE_PROVIDER_SITE_OTHER): Payer: Managed Care, Other (non HMO) | Admitting: Obstetrics and Gynecology

## 2018-11-05 ENCOUNTER — Other Ambulatory Visit: Payer: Self-pay

## 2018-11-05 VITALS — BP 124/78 | Ht 65.0 in | Wt 271.0 lb

## 2018-11-05 DIAGNOSIS — N926 Irregular menstruation, unspecified: Secondary | ICD-10-CM

## 2018-11-05 DIAGNOSIS — Z1389 Encounter for screening for other disorder: Secondary | ICD-10-CM

## 2018-11-05 DIAGNOSIS — Z3202 Encounter for pregnancy test, result negative: Secondary | ICD-10-CM | POA: Diagnosis not present

## 2018-11-05 LAB — POCT URINE PREGNANCY: Preg Test, Ur: NEGATIVE

## 2018-11-05 NOTE — Progress Notes (Signed)
Postpartum Visit  Chief Complaint:  Chief Complaint  Patient presents with  . Postpartum Care    pt supposed to get Mirena IUD today, has not had a cycle since delivery. per pt she had unprotected intercourse 2 weeks ago    History of Present Illness: Patient is a 23 y.o. [id number] presents for postpartum visit.  Date of delivery: 09/22/2018 Type of delivery: Vaginal delivery - Vacuum or forceps assisted  no Episiotomy No.  Laceration: 1st Degree Pregnancy or labor problems:  no Any problems since the delivery:  Anxiety - doing well on current medications and dosing  Newborn Details:  SINGLETON :  1. Baby's name: Bella Kennedy. Birth weight: 8.5 oz Maternal Details:  Breast Feeding:  BOTTLE Post partum depression/anxiety noted:  Some anxiety, pt on Zoloft and Wellbutrin  Edinburgh Post-Partum Depression Score:  6  Date of last PAP: 10/13/2017  normal   Past Medical History:  Diagnosis Date  . Anxiety   . Depression     Past Surgical History:  Procedure Laterality Date  . FRACTURE SURGERY     Left  . WISDOM TOOTH EXTRACTION      Prior to Admission medications   Medication Sig Start Date End Date Taking? Authorizing Provider  buPROPion (WELLBUTRIN XL) 150 MG 24 hr tablet Take 1 tablet (150 mg total) by mouth daily. 06/12/18  Yes [name], MD  famotidine (PEPCID) 20 MG tablet Take 20 mg by mouth 2 (two) times daily.   Yes [provider]  sertraline (ZOLOFT) 50 MG tablet Take 1 tablet (50 mg total) by mouth daily. 09/24/18  Yes Dalia Heading, CNM  docusate sodium (COLACE) 100 MG capsule Take 100 mg by mouth 2 (two) times daily.    [provider]  ibuprofen (ADVIL) 600 MG tablet Take 1 tablet (600 mg total) by mouth every 6 (six) hours. Patient not taking: Reported on 11/05/2018 09/23/18   Dalia Heading, CNM  omeprazole (PRILOSEC) 10 MG capsule Take 10 mg by mouth daily.    [provider]  Prenatal Vit-Fe Fumarate-FA (PRENATAL VITAMIN PO)  Take by mouth.    [provider]    Allergies  Allergen Reactions  . Other Itching    Vitamin E cream  . Diflucan [Fluconazole] Rash     Social History   Socioeconomic History  . Marital status: Single    Spouse name: Not on file  . Number of children: Not on file  . Years of education: Not on file  . Highest education level: Not on file  Occupational History  . Not on file  Social Needs  . Financial resource strain: Not on file  . Food insecurity    Worry: Not on file    Inability: Not on file  . Transportation needs    Medical: Not on file    Non-medical: Not on file  Tobacco Use  . Smoking status: Former Smoker    Packs/day: 0.50    Types: Cigarettes    Start date: 04/01/2016    Quit date: 12/23/2017    Years since quitting: 0.8  . Smokeless tobacco: Never Used  . Tobacco comment: Had quit in 2017, started back in 2018 but quit when she found out she was pregnant.  Substance and Sexual Activity  . Alcohol use: No  . Drug use: No  . Sexual activity: Not Currently    Birth control/protection: I.U.D., Other-see comments    Comment: Became pregnant with IUD, IUD was removed  Lifestyle  . Physical  activity    Days per week: Not on file    Minutes per session: Not on file  . Stress: Not on file  Relationships  . Social Musicianconnections    Talks on phone: Not on file    Gets together: Not on file    Attends religious service: Not on file    Active member of club or organization: Not on file    Attends meetings of clubs or organizations: Not on file    Relationship status: Not on file  . Intimate partner violence    Fear of current or ex partner: Not on file    Emotionally abused: Not on file    Physically abused: Not on file    Forced sexual activity: Not on file  Other Topics Concern  . Not on file  Social History Narrative  . Not on file   Family History: denies history of gynecologic cancer  Review of Systems  Constitutional: Negative.   HENT:  Negative.   Eyes: Negative.   Respiratory: Negative.   Cardiovascular: Negative.   Gastrointestinal: Negative.   Genitourinary: Negative.   Musculoskeletal: Negative.   Skin: Negative.   Neurological: Negative.   Psychiatric/Behavioral: Negative.      Physical Exam BP 124/78   Ht 5\' 5"  (1.651 m)   Wt 271 lb (122.9 kg)   BMI 45.10 kg/m   Physical Exam Constitutional:      General: She is not in acute distress.    Appearance: Normal appearance. She is well-developed.  HENT:     Head: Normocephalic and atraumatic.  Eyes:     General: No scleral icterus.    Conjunctiva/sclera: Conjunctivae normal.  Neck:     Musculoskeletal: Normal range of motion and neck supple.  Cardiovascular:     Rate and Rhythm: Normal rate and regular rhythm.     Heart sounds: No murmur. No friction rub. No gallop.   Pulmonary:     Effort: Pulmonary effort is normal. No respiratory distress.     Breath sounds: Normal breath sounds. No wheezing or rales.  Abdominal:     General: Bowel sounds are normal. There is no distension.     Palpations: Abdomen is soft. There is no mass.     Tenderness: There is no abdominal tenderness. There is no guarding or rebound.  Musculoskeletal: Normal range of motion.  Neurological:     General: No focal deficit present.     Mental Status: She is alert and oriented to person, place, and time.     Cranial Nerves: No cranial nerve deficit.  Skin:    General: Skin is warm and dry.     Findings: No erythema.  Psychiatric:        Mood and Affect: Mood normal.        Behavior: Behavior normal.        Judgment: Judgment normal.    Assessment: 23 y.o. [id number] presenting for 6 week postpartum visit  Plan: Problem List Items Addressed This Visit    None    Visit Diagnoses    Missed period    -  Primary   Relevant Orders   POCT urine pregnancy (Completed)   Postpartum care and examination           1) Contraception Education given regarding options for  contraception, including IUD. wait 1 week to place IUD.  She had unprotected intercourse 2 weeks ago and has not had a menses since then.  Negative pregnancy test today.  Wait  1 more week to be absolutely sure.  2)  Pap - ASCCP guidelines and rational discussed.  Patient opts for routine screening interval  3) Patient underwent screening for postpartum depression with no new concerns noted.  Continue current medications at current doses.  She states she does not need any refills at this time.  4) Follow up 1 week for iud placement  Thomasene MohairStephen Moneka Mcquinn, MD 11/05/2018 9:56 AM

## 2018-11-05 NOTE — Telephone Encounter (Signed)
Mirena reserved for pt (not used 11/05/2018) Pt is scheduled for insertion 11/13/2018. Device remains reserved.

## 2018-11-13 ENCOUNTER — Ambulatory Visit (INDEPENDENT_AMBULATORY_CARE_PROVIDER_SITE_OTHER): Payer: Managed Care, Other (non HMO) | Admitting: Obstetrics and Gynecology

## 2018-11-13 ENCOUNTER — Encounter: Payer: Self-pay | Admitting: Obstetrics and Gynecology

## 2018-11-13 ENCOUNTER — Other Ambulatory Visit: Payer: Self-pay

## 2018-11-13 VITALS — BP 122/74 | Ht 65.0 in | Wt 270.0 lb

## 2018-11-13 DIAGNOSIS — Z30430 Encounter for insertion of intrauterine contraceptive device: Secondary | ICD-10-CM | POA: Diagnosis not present

## 2018-11-13 MED ORDER — LEVONORGESTREL 20 MCG/24HR IU IUD: 1.0000 | INTRAUTERINE_SYSTEM | Freq: Once | INTRAUTERINE | 0 refills | Status: DC

## 2018-11-13 NOTE — Progress Notes (Signed)
    IUD Insertion Procedure Note Patient identified, informed consent performed, consent signed.   Discussed risks of irregular bleeding, cramping, infection, malpositioning, expulsion or uterine perforation of the IUD (1:1000 placements)  which may require further procedure such as laparoscopy.  IUD while effective at preventing pregnancy do not prevent transmission of sexually transmitted diseases and use of barrier methods for this purpose was discussed. Time out was performed.  Urine pregnancy test negative.  Speculum placed in the vagina.  Cervix visualized.  Cleaned with Betadine x 2.  Grasped anteriorly with a single tooth tenaculum.  Uterus sounded to 7.5 cm. IUD placed per manufacturer's recommendations.  Strings trimmed to 3 cm. Tenaculum was removed, good hemostasis noted.  Patient tolerated procedure well.   Patient was given post-procedure instructions.  She was advised to have backup contraception for one week.  Patient was also asked to check IUD strings periodically and follow up in 4 weeks for IUD check.   Prentice Docker, MD, [facility], [facility] 11/13/2018 3:23 PM

## 2018-12-11 ENCOUNTER — Other Ambulatory Visit: Payer: Self-pay

## 2018-12-11 ENCOUNTER — Ambulatory Visit (INDEPENDENT_AMBULATORY_CARE_PROVIDER_SITE_OTHER): Payer: Managed Care, Other (non HMO) | Admitting: Obstetrics and Gynecology

## 2018-12-11 ENCOUNTER — Encounter: Payer: Self-pay | Admitting: Obstetrics and Gynecology

## 2018-12-11 VITALS — BP 128/74 | Ht 65.0 in | Wt 279.0 lb

## 2018-12-11 DIAGNOSIS — O0990 Supervision of high risk pregnancy, unspecified, unspecified trimester: Secondary | ICD-10-CM

## 2018-12-11 DIAGNOSIS — Z30431 Encounter for routine checking of intrauterine contraceptive device: Secondary | ICD-10-CM

## 2018-12-11 DIAGNOSIS — Z30011 Encounter for initial prescription of contraceptive pills: Secondary | ICD-10-CM

## 2018-12-11 DIAGNOSIS — F418 Other specified anxiety disorders: Secondary | ICD-10-CM

## 2018-12-11 MED ORDER — NORGESTIMATE-ETH ESTRADIOL 0.25-35 MG-MCG PO TABS: 1.0000 | ORAL_TABLET | Freq: Every day | ORAL | 0 refills | Status: DC

## 2018-12-11 MED ORDER — SERTRALINE HCL 50 MG PO TABS: 50.0000 mg | ORAL_TABLET | Freq: Every day | ORAL | 1 refills | Status: DC

## 2018-12-11 MED ORDER — BUPROPION HCL ER (XL) 150 MG PO TB24: 150.0000 mg | ORAL_TABLET | Freq: Every day | ORAL | 1 refills | Status: DC

## 2018-12-11 NOTE — Progress Notes (Signed)
IUD String Check  Subjctive: [name] presents for IUD string check.  She had a Mirena placed 4 weeks ago.  Since placement of her IUD she had minor vaginal bleeding.  She denies cramping or discomfort.  She has had intercourse since placement.  She has not checked the strings.  She denies any fever, chills, nausea, vomiting, or other complaints.    She also states that she is doing well on her medication for anxiety and depression and would like a refill.  She denies side effects of the medication.  She denies suicidal and homicidal ideation.  She would like to continue the same medication at the current dose.  She requests a refill.  Objective: BP 128/74   Ht 5\' 5"  (1.651 m)   Wt 279 lb (126.6 kg)   BMI 46.43 kg/m  Physical Exam Vitals signs reviewed. Exam conducted with a chaperone present.  Constitutional:      General: She is not in acute distress.    Appearance: She is well-developed.  HENT:     Head: Normocephalic and atraumatic.     Nose: Nose normal.  Eyes:     Conjunctiva/sclera: Conjunctivae normal.  Neck:     Musculoskeletal: Normal range of motion.  Cardiovascular:     Rate and Rhythm: Normal rate and regular rhythm.     Heart sounds: Normal heart sounds.  Pulmonary:     Effort: Pulmonary effort is normal.     Breath sounds: Normal breath sounds.  Abdominal:     General: There is no distension.     Palpations: Abdomen is soft.     Tenderness: There is no abdominal tenderness. There is no guarding or rebound.     Hernia: There is no hernia in the left inguinal area or right inguinal area.  Genitourinary:    General: Normal vulva.     Exam position: Lithotomy position.     Tanner stage (genital): 5.     Labia:        Right: No rash, tenderness or lesion.        Left: No rash, tenderness or lesion.      Vagina: Normal. No vaginal discharge.     Cervix: No cervical motion tenderness, discharge or cervical bleeding.     Uterus: Normal. Not enlarged,  not fixed and not tender.      Adnexa: Right adnexa normal and left adnexa normal.       Right: No mass, tenderness or fullness.         Left: No mass, tenderness or fullness.       Comments: IUD Strings visualized at about 3 cm in length. Not trimmed today. Musculoskeletal: Normal range of motion.  Lymphadenopathy:     Lower Body: No right inguinal adenopathy. No left inguinal adenopathy.  Skin:    General: Skin is warm and dry.     Findings: No rash.  Neurological:     Mental Status: She is alert and oriented to person, place, and time.  Psychiatric:        Behavior: Behavior normal.     Female chaperone was present for the entirety of the pelvic exam  Assessment: 23 y.o. year old female status post prior Mirena IUD placement 4 week ago, doing well. Depression and anxiety: We will refill current medications.  Plan: 1.  The patient was given instructions to check her IUD strings monthly and call with any problems or concerns.  She should call for fevers, chills,  abnormal vaginal discharge, pelvic pain, or other complaints. 2.  She will return for a annual exam in 1 year.  All questions answered.  15 minutes spent in face to face discussion with > 50% spent in counseling, management, and coordination of care for her newly-placed IUD.  Risks and benefits of IUD discussed including the risks of irregular bleeding, cramping, infection, malpositioning, expulsion, which may require further procedures such as laparoscopy.  IUDs while effective at preventing pregnancy do not prevent transmission of sexually transmitted diseases and use of barrier methods for this purpose was discussed.  Low overall incidence of failure with 99.7% efficacy rate in typical use.    Prentice Docker, MD 12/11/2018 4:35 PM

## 2018-12-12 ENCOUNTER — Encounter: Payer: Self-pay | Admitting: Obstetrics and Gynecology

## 2019-05-06 LAB — FETAL NONSTRESS TEST

## 2019-06-09 ENCOUNTER — Telehealth: Payer: Self-pay

## 2019-06-09 NOTE — Telephone Encounter (Signed)
Pt left msg on triage line saying she has had some occasions where she feels dizzy (like if she's going to pass out, but does not), looses hearing in one ear and feels nauseous afterwards. Denies any vaginal bleeding or any GYN issue/concern. Advised to see her PCP and let us know if we can help afterwards.

## 2019-07-15 NOTE — Progress Notes (Signed)
Patient ID: Brittney Gill, female    DOB: 09-20-1995, 24 y.o.   MRN: [id number]  PCP: Jamelle Haring, MD  Chief Complaint  Patient presents with  . New Patient (Initial Visit)  . Establish Care  . Weight Loss    Patient would like to discuss was to lose weight    Subjective:   Brittney Gill is a 24 y.o. female, presents to clinic with CC of the following:  Chief Complaint  Patient presents with  . New Patient (Initial Visit)  . Establish Care  . Weight Loss    Patient would like to discuss was to lose weight    HPI:  Patient is a 24 year old female who has more recently been followed by OB/GYN with a spontaneous vaginal delivery noted September 22, 2018. During pregnancy, the following medical issues were noted: elevated blood pressure, mild anemia, and abnormal glucose tolerance test, and anxiety and depression. Her follow-up in September with OB/GYN noted that her depression and anxiety was well controlled on her current medication, which is Zoloft and bupropion , with that follow-up also for a check of her IUD. She notes that the Zoloft and bupropion are still very helpful in managing her anxiety and depression, and that has remained good on these medications.  Her past medical history is also remarkable for: Morbid Obesity She noted she has continued to gain weight after delivery, and admitted quite honestly that she does not watch the quantity or quality of her diet right now, really does no regular exercise, just chases her 2 kids around for her exercise. She noted she was on phentermine at one point in the past, but just was not committed, and was not particularly helpful. She is very much in favor of trying to have medicines again to help if they will, as she very much wants to lose weight now. works at [employer] and requested that labs be done with them. Wt Readings from Last 3 Encounters:  07/16/19 (!) 303 lb 12.8 oz (137.8 kg)  12/11/18 279 lb (126.6  kg)  11/13/18 270 lb (122.5 kg)     Low Vit D Last vitamin D She is not taking a supplement presently Recent Labs[] Expand by Default       Lab Results  Component Value Date   VD25OH 15.4 (L) 10/13/2017     She denies any past medical history of cardiac concern, no pulmonary history of concern with no asthma, states all in all she is very healthy otherwise.  Denies any history of thyroid disease.  No diagnosis of diabetes in her past.  Denies any vision concerns, no chronic abdominal pains, no bleeding per rectum or dark black stools, no dysuria or problems urinating, no joint swellings or pains, no balance issues.  Tobacco-former user Alcohol-denied Patient Active Problem List   Diagnosis Date Noted  . Indication for care in labor and delivery, antepartum 09/22/2018  . Normal labor and delivery 09/22/2018  . Postpartum care following vaginal delivery 09/22/2018  . Anemia during pregnancy 07/14/2018  . Elevated blood pressure affecting pregnancy in third trimester, antepartum 06/26/2018  . Abnormal glucose tolerance test in pregnancy 03/13/2018  . Depression with anxiety 02/10/2018  . Obesity affecting pregnancy 02/10/2018  . BMI 45.0-49.9, adult (HCC) 02/10/2018  . Supervision of high risk pregnancy, antepartum 02/02/2018  . Pregnancy occurring while using intrauterine contraceptive device (IUD) 02/02/2018  . Hypovitaminosis D 10/16/2017  . Depression 10/13/2017  . Anxiety 07/01/2016  . Morbid obesity (  HCC) 09/23/2011      Current Outpatient Medications:  .  buPROPion (WELLBUTRIN XL) 150 MG 24 hr tablet, Take 1 tablet (150 mg total) by mouth daily., Disp: 90 tablet, Rfl: 1 .  famotidine (PEPCID) 20 MG tablet, Take 20 mg by mouth 2 (two) times daily., Disp: , Rfl:  .  sertraline (ZOLOFT) 50 MG tablet, Take 1 tablet (50 mg total) by mouth daily., Disp: 90 tablet, Rfl: 1 .  levonorgestrel (MIRENA) 20 MCG/24HR IUD, 1 Intra Uterine Device (1 each total) by Intrauterine route  once for 1 dose., Disp: 1 each, Rfl: 0   Allergies  Allergen Reactions  . Other Itching    Vitamin E cream  . Diflucan [Fluconazole] Rash     Past Surgical History:  Procedure Laterality Date  . FRACTURE SURGERY     Left  . WISDOM TOOTH EXTRACTION       History reviewed. No pertinent family history. Denied any family history of thyroid disease.  Social History   Tobacco Use  . Smoking status: Former Smoker    Packs/day: 0.50    Types: Cigarettes    Start date: 04/01/2016    Quit date: 12/23/2017    Years since quitting: 1.5  . Smokeless tobacco: Never Used  . Tobacco comment: Had quit in 2017, started back in 2018 but quit when she found out she was pregnant.  Substance Use Topics  . Alcohol use: No    Comment: social-not often    With staff assistance, above reviewed with the patient today.  ROS: As per HPI, otherwise no specific complaints on a limited and focused system review   No results found for this or any previous visit (from the past 72 hour(s)).   PHQ2/9: Depression screen [facility] 2/9 07/16/2019 01/12/2018 11/10/2017 11/10/2017  Decreased Interest 0 1 2 2   Down, Depressed, Hopeless 0 1 1 1   PHQ - 2 Score 0 2 3 3   Altered sleeping 0 1 2 2   Tired, decreased energy 0 1 2 2   Change in appetite 3 1 3 3   Feeling bad or failure about yourself  0 0 1 1  Trouble concentrating 0 0 2 2  Moving slowly or fidgety/restless 0 0 1 1  Suicidal thoughts 0 0 0 0  PHQ-9 Score 3 5 14 14   Difficult doing work/chores Somewhat difficult Somewhat difficult Somewhat difficult Somewhat difficult   PHQ-2/9 Result reviewed   GAD 7 : Generalized Anxiety Score 07/16/2019 01/12/2018 11/10/2017 11/10/2017  Nervous, Anxious, on Edge 1 1 3 3   Control/stop worrying 0 1 3 3   Worry too much - different things 1 1 3 3   Trouble relaxing 0 2 3 3   Restless 0 1 1 1   Easily annoyed or irritable 1 1 3 3   Afraid - awful might happen 0 1 2 2   Total GAD 7 Score 3 8 18 18   Anxiety Difficulty  Somewhat difficult Somewhat difficult Very difficult Very difficult     Fall Risk: Fall Risk  07/16/2019  Falls in the past year? 0  Number falls in past yr: 0  Injury with Fall? 0      Objective:   Vitals:   07/16/19 1412  BP: 122/82  Pulse: 98  Resp: 16  Temp: (!) 97.3 F (36.3 C)  TempSrc: Temporal  SpO2: 98%  Weight: (!) 303 lb 12.8 oz (137.8 kg)  Height: 5\' 5"  (1.651 m)    Body mass index is 50.55 kg/m.  Physical Exam   NAD, masked,  pleasant, obese HEENT - [place]/AT, sclera anicteric, PERRL, EOMI, conj - non-inj'ed, TM's and canals clear, pharynx clear Neck - supple, no adenopathy, no TM, carotids 2+ and = without bruits bilat Car - RRR without m/g/r Pulm- RR and effort normal at rest, CTA without wheeze or rales Abd - soft, obese, NT, ND, BS+,  no masses, no obvious HSM Back - no CVA tenderness Skin- no rash noted on exposed areas, small tattoo on her left upper inner arm Ext - no LE edema,  Neuro/psychiatric - affect was not flat, appropriate with conversation  Alert and oriented  Grossly non-focal - good strength on testing extremities, sensation intact to LT in distal extremities, DTRs 2+ and equal in the patella, Romberg was negative, no pronator drift, good finger-to-nose, good balance on 1 foot, gait was normal with good tandem walk  Speech normal   Results for orders placed or performed in visit on 11/05/18  POCT urine pregnancy  Result Value Ref Range   Preg Test, Ur Negative Negative       Assessment & Plan:  1. Morbid obesity ([place]) Discussed at length the importance of dietary modifications and exercise in helping lose weight.  Both quality of eating healthy, and quantity of portion control important and looking at calories, especially calories from fat helpful.  Medications can also be helpful, with the combination of all of the above important in trying to help lose weight. Discussed the referral to nutrition therapy, and she agreed with that and  that was done. Discussed medications briefly today, and will await laboratory results, and her getting in with the medical nutrition consultants, and will return to discuss potential medications that may be added in addition.  Did briefly review medicine use for diabetes that can be helpful (like an Ozempic product), also phentermine and a phentermine/topiramate combination is available to try, and these medicines all at risk benefits that need to be discussed, and did note the phentermine product are controlled substances and need to be closely monitored if started. - Amb ref to Medical Nutrition Therapy-MNT - CBC with Differential/Platelet - Lipid panel - TSH - Comprehensive metabolic panel  2. Depression with anxiety She is doing well on the medications above, with her GAD and her PHQ-9 scores were reviewed with her today. We will continue those medications presently. Continue to monitor. - CBC with Differential/Platelet - TSH - Comprehensive metabolic panel  3. Vitamin D deficiency Has a history of low vitamin D levels, and did feel reasonable to check again today May need to return to a supplement, the amount pending these results. - VITAMIN D 25 Hydroxy (Vit-D Deficiency, Fractures)  4. Screening for lipid disorders Also will check cholesterol status today - Lipid panel  5. Anemia during pregnancy We will check a CBC today to follow-up.  6. Abnormal glucose tolerance test in pregnancy She has no history of diabetes, and will check some labs today.  Recommended a follow-up in 4 weeks time, will follow-up sooner as needed. I did congratulate her for coming in today and taking the next steps to trying to intervene now and start to lose weight, and strongly encouraged her to be committed moving forward and the positive health benefits that will result with weight loss over time. Did not discuss today the bariatric surgery options, plan to in the near future pending her response to  the above.        Towanda Malkin, MD 07/16/19 2:34 PM

## 2019-07-16 ENCOUNTER — Encounter: Payer: Self-pay | Admitting: Internal Medicine

## 2019-07-16 ENCOUNTER — Ambulatory Visit (INDEPENDENT_AMBULATORY_CARE_PROVIDER_SITE_OTHER): Payer: Managed Care, Other (non HMO) | Admitting: Internal Medicine

## 2019-07-16 ENCOUNTER — Other Ambulatory Visit: Payer: Self-pay

## 2019-07-16 DIAGNOSIS — F418 Other specified anxiety disorders: Secondary | ICD-10-CM

## 2019-07-16 DIAGNOSIS — Z13220 Encounter for screening for lipoid disorders: Secondary | ICD-10-CM | POA: Diagnosis not present

## 2019-07-16 DIAGNOSIS — O99810 Abnormal glucose complicating pregnancy: Secondary | ICD-10-CM

## 2019-07-16 DIAGNOSIS — E559 Vitamin D deficiency, unspecified: Secondary | ICD-10-CM

## 2019-07-16 DIAGNOSIS — O99019 Anemia complicating pregnancy, unspecified trimester: Secondary | ICD-10-CM

## 2019-07-16 NOTE — Patient Instructions (Signed)

## 2019-07-17 LAB — CBC WITH DIFFERENTIAL/PLATELET
Basophils Absolute: 0.1 10*3/uL (ref 0.0–0.2)
Basos: 1 %
EOS (ABSOLUTE): 0.2 10*3/uL (ref 0.0–0.4)
Eos: 1 %
Hematocrit: 37.4 % (ref 34.0–46.6)
Hemoglobin: 12.5 g/dL (ref 11.1–15.9)
Immature Grans (Abs): 0 10*3/uL (ref 0.0–0.1)
Immature Granulocytes: 0 %
Lymphocytes Absolute: 3 10*3/uL (ref 0.7–3.1)
Lymphs: 23 %
MCH: 27.4 pg (ref 26.6–33.0)
MCHC: 33.4 g/dL (ref 31.5–35.7)
MCV: 82 fL (ref 79–97)
Monocytes Absolute: 0.9 10*3/uL (ref 0.1–0.9)
Monocytes: 7 %
Neutrophils Absolute: 8.7 10*3/uL — ABNORMAL HIGH (ref 1.4–7.0)
Neutrophils: 68 %
Platelets: 403 10*3/uL (ref 150–450)
RBC: 4.57 x10E6/uL (ref 3.77–5.28)
RDW: 13.6 % (ref 11.7–15.4)
WBC: 12.8 10*3/uL — ABNORMAL HIGH (ref 3.4–10.8)

## 2019-07-17 LAB — COMPREHENSIVE METABOLIC PANEL
ALT: 22 IU/L (ref 0–32)
AST: 18 IU/L (ref 0–40)
Albumin/Globulin Ratio: 1.6 (ref 1.2–2.2)
Albumin: 4.2 g/dL (ref 3.9–5.0)
Alkaline Phosphatase: 86 IU/L (ref 39–117)
BUN/Creatinine Ratio: 15 (ref 9–23)
BUN: 10 mg/dL (ref 6–20)
Bilirubin Total: <0.2 mg/dL (ref 0.0–1.2)
CO2: 25 mmol/L (ref 20–29)
Calcium: 9.1 mg/dL (ref 8.7–10.2)
Chloride: 101 mmol/L (ref 96–106)
Creatinine, Ser: 0.65 mg/dL (ref 0.57–1.00)
GFR calc Af Amer: 144 mL/min/{1.73_m2} (ref >59)
GFR calc non Af Amer: 125 mL/min/{1.73_m2} (ref >59)
Globulin, Total: 2.6 g/dL (ref 1.5–4.5)
Glucose: 109 mg/dL — ABNORMAL HIGH (ref 65–99)
Potassium: 4.2 mmol/L (ref 3.5–5.2)
Sodium: 138 mmol/L (ref 134–144)
Total Protein: 6.8 g/dL (ref 6.0–8.5)

## 2019-07-17 LAB — VITAMIN D 25 HYDROXY (VIT D DEFICIENCY, FRACTURES): Vit D, 25-Hydroxy: 21.2 ng/mL — ABNORMAL LOW (ref 30.0–100.0)

## 2019-07-17 LAB — LIPID PANEL
Chol/HDL Ratio: 4 ratio (ref 0.0–4.4)
Cholesterol, Total: 165 mg/dL (ref 100–199)
HDL: 41 mg/dL (ref >39)
LDL Chol Calc (NIH): 100 mg/dL — ABNORMAL HIGH (ref 0–99)
Triglycerides: 133 mg/dL (ref 0–149)
VLDL Cholesterol Cal: 24 mg/dL (ref 5–40)

## 2019-07-17 LAB — TSH: TSH: 1.87 u[IU]/mL (ref 0.450–4.500)

## 2019-07-28 ENCOUNTER — Encounter: Payer: Self-pay | Admitting: Internal Medicine

## 2019-08-12 NOTE — Progress Notes (Deleted)
Patient is a 24 year old female whose first visit with me was on 07/16/2019 Of note, she recently contacted OB/GYN again on 08/10/2019 with concerns of her IUD falling out with removal of a tampon. She follows up today.   Morbid obesity ([place]) - She noted last visit that she has continued to gain weight after her last delivery, with no dietary limitations and no regular exercise (chases her 2 kids around for her exercise) She noted she was on phentermine at one point in the past, but just was not committed, and was not particularly helpful, and she was anxious to try to have medicines again involved to help with weight loss. I did briefly review medication options last visit, and labs were checked to be done before starting any medications. Did refer her last visit to medical nutrition therapy -   Wt Readings from Last 3 Encounters:  07/16/19 (!) 303 lb 12.8 oz (137.8 kg)  12/11/18 279 lb (126.6 kg)  11/13/18 270 lb (122.5 kg)   Depression with anxiety Medication regimen-Zoloft and bupropion She is doing well on the medications with her GAD and her PHQ-9 scores reviewed again today  Vitamin D deficiency Last vitamin D Lab Results  Component Value Date   VD25OH 21.2 (L) 07/16/2019   Taking a vitamin D supplement of [phone] IUs (20 to 25 mg) daily was recommended  Leukocytosis  The CBC showed a mild elevation in the WBC (white blood cell count) on recent blood draw which was not new looking at previous blood counts, and less than the two most recent ones checked.  Noting that the other counts including the platelet count were normal, with no evidence of anemia, recommended rechecking this again in about 3 months to monitor The lipid panel was ok, with the LDL cholesterol (lousy type) just above the desired range (<100), and in individuals who are diabetic, like to keep this <70. Further guidance with the nutritionist will be helpful. The thyroid screening test (TSH) was normal. The  complete metabolic panel showed a slightly high glucose at 109, and otherwise was all normal.This does not mean you have diabetes, and I think it is best to check an A1c on next lab draw to help assess your sugars over the past weeks and is not just a one time check of blood sugars. The Vit D level was slightly lower than desired and c/w mild deficiency. I would recommend taking a supplement of Vit D3 - 800 to 1000 international units (20 to 25 micrograms) daily. These are available over the counter to get, and let me know if any difficulty getting. Will recheck this level again in about 3 months to re-assess.  Mild hyperlipidemia The lipid panel was just borderline abnormal, with the LDL cholesterol just at the top of the desired range Lab Results  Component Value Date   CHOL 165 07/16/2019   HDL 41 07/16/2019   LDLCALC 100 (H) 07/16/2019   TRIG 133 07/16/2019   CHOLHDL 4.0 07/16/2019   Anemia during pregnancy Recent H/H stable Lab Results  Component Value Date   WBC 12.8 (H) 07/16/2019   HGB 12.5 07/16/2019   HCT 37.4 07/16/2019   MCV 82 07/16/2019   PLT 403 07/16/2019    Abnormal glucose tolerance test in pregnancy Glucose was very slightly elevated on last lab draw, will check a hemoglobin A1c when has her next lab draw in the near future.    Did not discuss today the bariatric surgery options, plan to in the  near future pending her response to the above.

## 2019-08-13 ENCOUNTER — Ambulatory Visit: Payer: Managed Care, Other (non HMO) | Admitting: Internal Medicine

## 2019-08-17 ENCOUNTER — Other Ambulatory Visit: Payer: Self-pay | Admitting: Obstetrics and Gynecology

## 2019-08-17 DIAGNOSIS — Z30011 Encounter for initial prescription of contraceptive pills: Secondary | ICD-10-CM

## 2019-08-17 MED ORDER — NORGESTIMATE-ETH ESTRADIOL 0.25-35 MG-MCG PO TABS: 1.0000 | ORAL_TABLET | Freq: Every day | ORAL | 4 refills | Status: DC

## 2019-08-27 ENCOUNTER — Encounter: Payer: Self-pay | Admitting: Internal Medicine

## 2019-08-27 ENCOUNTER — Ambulatory Visit (INDEPENDENT_AMBULATORY_CARE_PROVIDER_SITE_OTHER): Payer: Managed Care, Other (non HMO) | Admitting: Internal Medicine

## 2019-08-27 ENCOUNTER — Ambulatory Visit: Payer: Managed Care, Other (non HMO) | Admitting: Dietician

## 2019-08-27 ENCOUNTER — Other Ambulatory Visit: Payer: Self-pay

## 2019-08-27 DIAGNOSIS — F418 Other specified anxiety disorders: Secondary | ICD-10-CM | POA: Diagnosis not present

## 2019-08-27 DIAGNOSIS — E559 Vitamin D deficiency, unspecified: Secondary | ICD-10-CM | POA: Diagnosis not present

## 2019-08-27 DIAGNOSIS — D72829 Elevated white blood cell count, unspecified: Secondary | ICD-10-CM | POA: Insufficient documentation

## 2019-08-27 DIAGNOSIS — R739 Hyperglycemia, unspecified: Secondary | ICD-10-CM

## 2019-08-27 MED ORDER — LIRAGLUTIDE -WEIGHT MANAGEMENT 18 MG/3ML ~~LOC~~ SOPN: PEN_INJECTOR | SUBCUTANEOUS | 2 refills | Status: DC

## 2019-08-27 NOTE — Telephone Encounter (Signed)
Copied from CRM [id number]. Topic: General - Other >> Aug 27, 2019  9:57 AM Worthy Keeler D wrote: Reason for CRM: Patient ci requesting her in office appt today to be changed to a virtual appt due to her kids being sick with colds she couldn't come into office.please advise

## 2019-08-27 NOTE — Progress Notes (Signed)
Name: Brittney Gill   MRN: [id number]    DOB: [birth year]   Date:08/27/2019       Progress Note  Subjective  Chief Complaint  Chief Complaint  Patient presents with  . Follow-up  . Weight Loss    Would like to discuss injection for weight loss    I connected with  Norman Herrlich on 08/27/19 at  3:00 PM EDT by telephone and verified that I am speaking with the correct person using two identifiers.   I discussed the limitations, risks, security and privacy concerns of performing an evaluation and management service by telephone and the availability of in person appointments. Staff also discussed with the patient that there may be a patient responsible charge related to this service. Patient Location: Home Provider Location: [facility] Additional Individuals present: none  HPI  Patient is a 24 year old female whose first visit with me was on 07/16/2019 Of note, she recently contacted OB/GYN again on 08/10/2019 with concerns of her IUD falling out with removal of a tampon. She follows up today.   Morbid obesity ([place]) - She noted last visit that she has continued to gain weight after her last delivery, with no dietary limitations and no regular exercise (chases her 2 kids around for her exercise) She noted she was on phentermine at one point in the past, but just was not committed, and was not particularly helpful, and she was anxious to try to have medicines again involved to help with weight loss. I did briefly review medication options last visit, and labs were checked to be done before starting any medications. Did refer her last visit to medical nutrition therapy - supposed to see them today, had to cancel due to a sick child today, now next Friday. She noted today she remains very anxious to try a medicine to help her lose weight.  Wt Readings from Last 3 Encounters:  08/27/19 (!) 303 lb (137.4 kg)  07/16/19 (!) 303 lb 12.8 oz (137.8 kg)  12/11/18 279 lb (126.6 kg)     Depression with anxiety Medication regimen-Zoloft and bupropion She is doing well on the medications and denies any increased concerns from a depression or anxiety standpoint.  Vitamin D deficiency Recent Labs       Lab Results  Component Value Date   VD25OH 21.2 (L) 07/16/2019     Not yet taking a vitamin D supplement of [phone] IUs (20 to 25 mg) daily that was recommended, encouraged her again today to take a supplement.  Leukocytosis  The CBC showed a mild elevation in the WBC (white blood cell count) on recent blood draw which was not new looking at previous blood counts, and less than the two most recent ones checked.  Noting that the other counts including the platelet count were normal, with no evidence of anemia, recommended rechecking this again in about 3 months to monitor Discussed this with her today.  Mild hyperlipidemia The lipid panel was just borderline abnormal, with the LDL cholesterol just at the top of the desired range Recent Labs       Lab Results  Component Value Date   CHOL 165 07/16/2019   HDL 41 07/16/2019   LDLCALC 100 (H) 07/16/2019   TRIG 133 07/16/2019   CHOLHDL 4.0 07/16/2019     Anemia during pregnancy Recent H/H stable Recent Labs[] Expand by Default       Lab Results  Component Value Date   WBC 12.8 (H) 07/16/2019  HGB 12.5 07/16/2019   HCT 37.4 07/16/2019   MCV 82 07/16/2019   PLT 403 07/16/2019      Abnormal glucose tolerance test in pregnancy Glucose was very slightly elevated on last lab draw, will check a hemoglobin A1c when has her next lab draw in the near future. Did note to her today that the liraglutide medicine is often used for diabetes, can be helpful if she has a slightly higher blood sugar or prediabetes concern.     Patient Active Problem List   Diagnosis Date Noted  . Leukocytosis 08/27/2019  . Hyperglycemia 08/27/2019  . Indication for care in labor and delivery, antepartum 09/22/2018   . Normal labor and delivery 09/22/2018  . Postpartum care following vaginal delivery 09/22/2018  . Anemia during pregnancy 07/14/2018  . Elevated blood pressure affecting pregnancy in third trimester, antepartum 06/26/2018  . Abnormal glucose tolerance test in pregnancy 03/13/2018  . Depression with anxiety 02/10/2018  . Obesity affecting pregnancy 02/10/2018  . BMI 45.0-49.9, adult (HCC) 02/10/2018  . Supervision of high risk pregnancy, antepartum 02/02/2018  . Pregnancy occurring while using intrauterine contraceptive device (IUD) 02/02/2018  . Vitamin D deficiency 10/16/2017  . Depression 10/13/2017  . Anxiety 07/01/2016  . Morbid obesity (HCC) 09/23/2011    Social History   Tobacco Use  . Smoking status: Former Smoker    Packs/day: 0.50    Types: Cigarettes    Start date: 04/01/2016    Quit date: 12/23/2017    Years since quitting: 1.6  . Smokeless tobacco: Never Used  . Tobacco comment: Had quit in 2017, started back in 2018 but quit when she found out she was pregnant.  Substance Use Topics  . Alcohol use: No    Comment: social-not often     Current Outpatient Medications:  .  buPROPion (WELLBUTRIN XL) 150 MG 24 hr tablet, Take 1 tablet (150 mg total) by mouth daily., Disp: 90 tablet, Rfl: 1 .  famotidine (PEPCID) 20 MG tablet, Take 20 mg by mouth 2 (two) times daily., Disp: , Rfl:  .  norgestimate-ethinyl estradiol (SPRINTEC 28) 0.25-35 MG-MCG tablet, Take 1 tablet by mouth daily., Disp: 84 tablet, Rfl: 4 .  sertraline (ZOLOFT) 50 MG tablet, Take 1 tablet (50 mg total) by mouth daily., Disp: 90 tablet, Rfl: 1  Allergies  Allergen Reactions  . Other Itching    Vitamin E cream  . Diflucan [Fluconazole] Rash    With staff assistance, the above was reviewed with the patient today.  ROS: As per HPI, otherwise no specific complaints on a limited and focused system review   Objective  Virtual encounter, vitals not obtained.  Body mass index is 50.42  kg/m.  Nursing Note and Vital Signs reviewed.  Physical Exam  Appears in NAD by verbal conversation Breathing: Appears in no respiratory distress. Speaking in complete sentences Neurological: Pt is alert, Speech is normal Psychiatric: Patient has a normal mood and affect,Judgment and thought content normal.    Assessment & Plan  1. Morbid obesity (HCC) Patient has medical nutrition consultation set up for next Friday now after had a cancel today.  Encouraged her to keep that appointment to follow-up. She very much did not want to start medication, and she was okay with one that is injectable, and discussed the medicines with her.  Noted both liraglutide and semaglutide in the discussion, with liraglutide (Saxenda) when that is often the first choice for use with weight loss. Did discuss potential cost limitations, and recommended trying to  go online and look for coupons, trying to call her insurance company to see if it is covered, and ways to try to help with cost noted. Prescribed liraglutide-0.6 mg daily for 1 week, then 1.2 mg daily and assess her response on a follow-up.  Over time, can go up to as high as 3 mg daily. Did discuss the risk/benefits with this medicine, and if she has any concerning symptoms after starting the importance to follow-up. She was understanding of this and really wanted to add this medicine to her regimen and await her response. I did note that if an alternative medicine in this class like semaglutide (Ozempic) is more cost effective, to let me know and can change the prescription to this entity.  2. Depression with anxiety Remains stable and continuing her current medications above  3. Vitamin D deficiency Did recommend she start a vitamin D supplement as was previously recommended upon lab review.  4. Leukocytosis, unspecified type We will recheck this lab again on her follow-up visit  5. Hyperglycemia And also check an A1c for completeness sake on  her follow-up visit as well  Plan to follow-up in a 6 to 8-week timeframe, follow-up sooner as needed.  - I discussed the assessment and treatment plan with the patient. The patient was provided an opportunity to ask questions and all were answered. The patient agreed with the plan and demonstrated an understanding of the instructions.   - The patient was advised to call back or seek an in-person evaluation if the symptoms worsen or if the condition fails to improve as anticipated.  I provided 15 minutes of non-face-to-face time during this encounter that included discussing at length patient's sx/history, pertinent pmhx, medications, treatment and follow up plan. This time also included the necessary documentation, orders, and chart review.  Jamelle Haring, MD

## 2019-09-01 ENCOUNTER — Other Ambulatory Visit: Payer: Self-pay

## 2019-09-03 ENCOUNTER — Encounter: Payer: Managed Care, Other (non HMO) | Attending: Internal Medicine | Admitting: Dietician

## 2019-09-03 ENCOUNTER — Encounter: Payer: Self-pay | Admitting: Dietician

## 2019-09-03 ENCOUNTER — Other Ambulatory Visit: Payer: Self-pay

## 2019-09-03 DIAGNOSIS — Z6841 Body Mass Index (BMI) 40.0 and over, adult: Secondary | ICD-10-CM | POA: Diagnosis not present

## 2019-09-03 DIAGNOSIS — Z713 Dietary counseling and surveillance: Secondary | ICD-10-CM | POA: Diagnosis present

## 2019-09-03 NOTE — Progress Notes (Signed)
Medical Nutrition Therapy: Visit start time: 1315  end time: 1430  Assessment:  Diagnosis: obesity Past medical history: none Psychosocial issues/ stress concerns: none  Preferred learning method:  . Auditory . Visual . Hands-on  Current weight: 307.9 lbs  Height: 5'5" Medications, supplements: reconciled in medical record   Progress and evaluation:   Pt reports making some changes to diet- doesn't fry food at home, decreased amount of takeout/dining out, less soda, smoking cessation with husband who is also a smoker     Pt reports working from home and at [employer] home (only drinks soda when at [employer])  Wife and Mother of 68 year old and 5 year old  Pt reports family does not eat many fruits and especially low on vegetable intake  Picky eater- low palate for vegetables and many fruits cause acid reflux  Tolerates well- apples, bananas, berries (not strawberries), grapes   Eats- broccoli, carrots, peppers (in stir fry), lettuce, and raw cauliflower  Pt reports eating when stressed and bored  Physical activity: ADLs   Dietary Intake:  Usual eating pattern includes 3 meals and 1+ (grazing) snacks per day. Dining out frequency: 4-5 meals per week.  Breakfast (7 or 8am): oatmeal 2 packets brown sugar Snack: pretzels Lunch: spaghetti o's, lunch meat, pretzels Snack: same as above  Supper: hamburgers, grilled/air-fried chicken, homemade chicken nuggets with baked beans, corn, green beans, lima beans, or salad Snack: same as above  Beverages: sprite, unsweet tea, coffee with half and half, 4-5 cups of water   Nutrition Care Education: Basic nutrition: basic food groups, appropriate nutrient balance, appropriate meal and snack schedule, general nutrition guidelines    Weight control: importance of low sugar and low fat choices, portion control strategies Advanced nutrition:  cooking techniques, dining out Other lifestyle changes:  benefits of making changes, increasing  motivation, readiness for change,   Nutritional Diagnosis:  Al[place]-3.3 Overweight/obesity As related to excess caloric intake and inadequate physical activity.  As evidenced by pt BMI of 51.24.  Intervention:   Discussion and instruction as noted above   Discussed healthy carb choices, role of fiber, protein, fat in a balanced diet   Pt seems to need smaller steps to transition to a healthier meal pattern  Did not seem ready to try new foods at this point; focused on balance within the foods she likes   Discussed importance of focusing on changing behaviors and relationship with food and body   Mindful eating-am I hungry flowchart, 101 things to do besides eat   At follow up- discuss healthy snack ideas,   Education Materials given:  . 10 healthy habits handout . [employer]- Healthy Weight Loss tips . Dining out handout . Plate Planner with food lists . Goals/ instructions  Learner/ who was taught:  . Patient   Level of understanding: Marland Kitchen Verbalizes/ demonstrates competency  Demonstrated degree of understanding via:   Teach back Learning barriers: . None  Willingness to learn/ readiness for change: . Eager, change in progress  Monitoring and Evaluation:  Dietary intake, exercise, and body weight      follow up: Friday, July 30 at 1:15p

## 2019-09-03 NOTE — Patient Instructions (Signed)
·   Begin to portion control carb-rich foods  2-3 servings at meals   1-2 servings at snacks  Try to incorporate more low-starchy vegetables at meals and snacks

## 2019-09-23 ENCOUNTER — Telehealth: Payer: Self-pay | Admitting: Obstetrics and Gynecology

## 2019-09-23 ENCOUNTER — Other Ambulatory Visit: Payer: Self-pay | Admitting: Obstetrics and Gynecology

## 2019-09-23 DIAGNOSIS — O26899 Other specified pregnancy related conditions, unspecified trimester: Secondary | ICD-10-CM

## 2019-09-23 DIAGNOSIS — R109 Unspecified abdominal pain: Secondary | ICD-10-CM

## 2019-09-23 NOTE — Telephone Encounter (Signed)
-----   Message from Conard Novak, MD sent at 09/23/2019  2:22 PM EDT ----- Regarding: u/s appt Hi [place] Dec, Please call this patient and set up an early OB ultrasound. She already has a NOB appt set up.  However, I'd like her to get an ultrasound as soon as you can get her one. I'll call her with the results.  The order has been placed. Thanks!

## 2019-09-23 NOTE — Telephone Encounter (Signed)
Patient is scheduled for 09/30/19 at 2 pm

## 2019-09-30 ENCOUNTER — Telehealth: Payer: Self-pay | Admitting: Obstetrics and Gynecology

## 2019-09-30 ENCOUNTER — Other Ambulatory Visit: Payer: Self-pay | Admitting: Obstetrics and Gynecology

## 2019-09-30 ENCOUNTER — Ambulatory Visit (INDEPENDENT_AMBULATORY_CARE_PROVIDER_SITE_OTHER): Payer: Managed Care, Other (non HMO)

## 2019-09-30 ENCOUNTER — Other Ambulatory Visit: Payer: Self-pay

## 2019-09-30 DIAGNOSIS — Z3A01 Less than 8 weeks gestation of pregnancy: Secondary | ICD-10-CM | POA: Diagnosis not present

## 2019-09-30 DIAGNOSIS — O26899 Other specified pregnancy related conditions, unspecified trimester: Secondary | ICD-10-CM

## 2019-09-30 DIAGNOSIS — R109 Unspecified abdominal pain: Secondary | ICD-10-CM

## 2019-09-30 NOTE — Telephone Encounter (Signed)
Discussed normal u/s findings.

## 2019-10-08 ENCOUNTER — Other Ambulatory Visit: Payer: Self-pay

## 2019-10-08 ENCOUNTER — Encounter: Payer: Self-pay | Admitting: Advanced Practice Midwife

## 2019-10-08 ENCOUNTER — Ambulatory Visit (INDEPENDENT_AMBULATORY_CARE_PROVIDER_SITE_OTHER): Payer: Managed Care, Other (non HMO) | Admitting: Advanced Practice Midwife

## 2019-10-08 VITALS — BP 116/70 | Wt 313.0 lb

## 2019-10-08 DIAGNOSIS — O0991 Supervision of high risk pregnancy, unspecified, first trimester: Secondary | ICD-10-CM

## 2019-10-08 DIAGNOSIS — Z3A01 Less than 8 weeks gestation of pregnancy: Secondary | ICD-10-CM

## 2019-10-08 DIAGNOSIS — Z113 Encounter for screening for infections with a predominantly sexual mode of transmission: Secondary | ICD-10-CM

## 2019-10-08 DIAGNOSIS — O99211 Obesity complicating pregnancy, first trimester: Secondary | ICD-10-CM

## 2019-10-08 NOTE — Patient Instructions (Signed)
Exercise During Pregnancy Exercise is an important part of being healthy for people of all ages. Exercise improves the function of your heart and lungs and helps you maintain strength, flexibility, and a healthy body weight. Exercise also boosts energy levels and elevates mood. Most women should exercise regularly during pregnancy. In rare cases, women with certain medical conditions or complications may be asked to limit or avoid exercise during pregnancy. How does this affect me? Along with maintaining general strength and flexibility, exercising during pregnancy can help:  Keep strength in muscles that are used during labor and childbirth.  Decrease low back pain.  Reduce symptoms of depression.  Control weight gain during pregnancy.  Reduce the risk of needing insulin if you develop diabetes during pregnancy.  Decrease the risk of cesarean delivery.  Speed up your recovery after giving birth. How does this affect my baby? Exercise can help you have a healthy pregnancy. Exercise does not cause premature birth. It will not cause your baby to weigh less at birth. What exercises can I do? Many exercises are safe for you to do during pregnancy. Do a variety of exercises that safely increase your heart and breathing rates and help you build and maintain muscle strength. Do exercises exactly as told by your health care provider. You may do these exercises:  Walking or hiking.  Swimming.  Water aerobics.  Riding a stationary bike.  Strength training.  Modified yoga or Pilates. Tell your instructor that you are pregnant. Avoid overstretching, and avoid lying on your back for long periods of time.  Running or jogging. Only choose this type of exercise if you: ? Ran or jogged regularly before your pregnancy. ? Can run or jog and still talk in complete sentences. What exercises should I avoid? Depending on your level of fitness and whether you exercised regularly before your  pregnancy, you may be told to limit high-intensity exercise. You can tell that you are exercising at a high intensity if you are breathing much harder and faster and cannot hold a conversation while exercising. You must avoid:  Contact sports.  Activities that put you at risk for falling on or being hit in the belly, such as downhill skiing, water skiing, surfing, rock climbing, cycling, gymnastics, and horseback riding.  Scuba diving.  Skydiving.  Yoga or Pilates in a room that is heated to high temperatures.  Jogging or running, unless you ran or jogged regularly before your pregnancy. While jogging or running, you should always be able to talk in full sentences. Do not run or jog so fast that you are unable to have a conversation.  Do not exercise at more than 6,000 feet above sea level (high elevation) if you are not used to exercising at high elevation. How do I exercise in a safe way?   Avoid overheating. Do not exercise in very high temperatures.  Wear loose-fitting, breathable clothes.  Avoid dehydration. Drink enough water before, during, and after exercise to keep your urine pale yellow.  Avoid overstretching. Because of hormone changes during pregnancy, it is easy to overstretch muscles, tendons, and ligaments during pregnancy.  Start slowly and ask your health care provider to recommend the types of exercise that are safe for you.  Do not exercise to lose weight. Follow these instructions at home:  Exercise on most days or all days of the week. Try to exercise for 30 minutes a day, 5 days a week, unless your health care provider tells you not to.  If   you actively exercised before your pregnancy and you are healthy, your health care provider may tell you to continue to do moderate to high-intensity exercise.  If you are just starting to exercise or did not exercise much before your pregnancy, your health care provider may tell you to do low to moderate-intensity  exercise. Questions to ask your health care provider  Is exercise safe for me?  What are signs that I should stop exercising?  Does my health condition mean that I should not exercise during pregnancy?  When should I avoid exercising during pregnancy? Stop exercising and contact a health care provider if: You have any unusual symptoms, such as:  Mild contractions of the uterus or cramps in the abdomen.  Dizziness that does not go away when you rest. Stop exercising and get help right away if: You have any unusual symptoms, such as:  Sudden, severe pain in your low back or your belly.  Mild contractions of the uterus or cramps in the abdomen that do not improve with rest and drinking fluids.  Chest pain.  Bleeding or fluid leaking from your vagina.  Shortness of breath. These symptoms may represent a serious problem that is an emergency. Do not wait to see if the symptoms will go away. Get medical help right away. Call your local emergency services (911 in the [place] Do not drive yourself to the hospital. Summary  Most women should exercise regularly throughout pregnancy. In rare cases, women with certain medical conditions or complications may be asked to limit or avoid exercise during pregnancy.  Do not exercise to lose weight during pregnancy.  Your health care provider will tell you what level of physical activity is right for you.  Stop exercising and contact a health care provider if you have mild contractions of the uterus or cramps in the abdomen. Get help right away if these contractions or cramps do not improve with rest and drinking fluids.  Stop exercising and get help right away if you have sudden, severe pain in your low back or belly, chest pain, shortness of breath, or bleeding or leaking of fluid from your vagina. This information is not intended to replace advice given to you by your health care provider. Make sure you discuss any questions you have with your  health care provider. Document Revised: 07/02/2018 Document Reviewed: 04/15/2018 Elsevier Patient Education  2020 [employer] Eating Plan for Pregnant Women While you are pregnant, your body requires additional nutrition to help support your growing baby. You also have a higher need for some vitamins and minerals, such as folic acid, calcium, iron, and vitamin D. Eating a healthy, well-balanced diet is very important for your health and your baby's health. Your need for extra calories varies for the three 3-month segments of your pregnancy (trimesters). For most women, it is recommended to consume:  150 extra calories a day during the first trimester.  300 extra calories a day during the second trimester.  300 extra calories a day during the third trimester. What are tips for following this plan?   Do not try to lose weight or go on a diet during pregnancy.  Limit your overall intake of foods that have "empty calories." These are foods that have little nutritional value, such as sweets, desserts, candies, and sugar-sweetened beverages.  Eat a variety of foods (especially fruits and vegetables) to get a full range of vitamins and minerals.  Take a prenatal vitamin to help meet your additional vitamin and mineral needs   during pregnancy, specifically for folic acid, iron, calcium, and vitamin D.  Remember to stay active. Ask your health care provider what types of exercise and activities are safe for you.  Practice good food safety and cleanliness. Wash your hands before you eat and after you prepare raw meat. Wash all fruits and vegetables well before peeling or eating. Taking these actions can help to prevent food-borne illnesses that can be very dangerous to your baby, such as listeriosis. Ask your health care provider for more information about listeriosis. What does 150 extra calories look like? Healthy options that provide 150 extra calories each day could be any of the  following:  6-8 oz (170-230 g) of plain low-fat yogurt with  cup of berries.  1 apple with 2 teaspoons (11 g) of peanut butter.  Cut-up vegetables with  cup (60 g) of hummus.  8 oz (230 mL) or 1 cup of low-fat chocolate milk.  1 stick of string cheese with 1 medium orange.  1 peanut butter and jelly sandwich that is made with one slice of whole-wheat bread and 1 tsp (5 g) of peanut butter. For 300 extra calories, you could eat two of those healthy options each day. What is a healthy amount of weight to gain? The right amount of weight gain for you is based on your BMI before you became pregnant. If your BMI:  Was less than 18 (underweight), you should gain 28-40 lb (13-18 kg).  Was 18-24.9 (normal), you should gain 25-35 lb (11-16 kg).  Was 25-29.9 (overweight), you should gain 15-25 lb (7-11 kg).  Was 30 or greater (obese), you should gain 11-20 lb (5-9 kg). What if I am having twins or multiples? Generally, if you are carrying twins or multiples:  You may need to eat 300-600 extra calories a day.  The recommended range for total weight gain is 25-54 lb (11-25 kg), depending on your BMI before pregnancy.  Talk with your health care provider to find out about nutritional needs, weight gain, and exercise that is right for you. What foods can I eat?  Fruits All fruits. Eat a variety of colors and types of fruit. Remember to wash your fruits well before peeling or eating. Vegetables All vegetables. Eat a variety of colors and types of vegetables. Remember to wash your vegetables well before peeling or eating. Grains All grains. Choose whole grains, such as whole-wheat bread, oatmeal, or brown rice. Meats and other protein foods Lean meats, including chicken, turkey, fish, and lean cuts of beef, veal, or pork. If you eat fish or seafood, choose options that are higher in omega-3 fatty acids and lower in mercury, such as salmon, herring, mussels, trout, sardines, pollock,  shrimp, crab, and lobster. Tofu. Tempeh. Beans. Eggs. Peanut butter and other nut butters. Make sure that all meats, poultry, and eggs are cooked to food-safe temperatures or "well-done." Two or more servings of fish are recommended each week in order to get the most benefits from omega-3 fatty acids that are found in seafood. Choose fish that are lower in mercury. You can find more information online:  www.fda.gov Dairy Pasteurized milk and milk alternatives (such as almond milk). Pasteurized yogurt and pasteurized cheese. Cottage cheese. Sour cream. Beverages Water. Juices that contain 100% fruit juice or vegetable juice. Caffeine-free teas and decaffeinated coffee. Drinks that contain caffeine are okay to drink, but it is better to avoid caffeine. Keep your total caffeine intake to less than 200 mg each day (which is 12 oz   or 355 mL of coffee, tea, or soda) or the limit as told by your health care provider. Fats and oils Fats and oils are okay to include in moderation. Sweets and desserts Sweets and desserts are okay to include in moderation. Seasoning and other foods All pasteurized condiments. The items listed above may not be a complete list of foods and beverages you can eat. Contact a dietitian for more information. What foods are not recommended? Fruits Unpasteurized fruit juices. Vegetables Raw (unpasteurized) vegetable juices. Meats and other protein foods Lunch meats, bologna, hot dogs, or other deli meats. (If you must eat those meats, reheat them until they are steaming hot.) Refrigerated pat, meat spreads from a meat counter, smoked seafood that is found in the refrigerated section of a store. Raw or undercooked meats, poultry, and eggs. Raw fish, such as sushi or sashimi. Fish that have high mercury content, such as tilefish, shark, swordfish, and king mackerel. To learn more about mercury in fish, talk with your health care provider or look for online resources, such  as:  www.fda.gov Dairy Raw (unpasteurized) milk and any foods that have raw milk in them. Soft cheeses, such as feta, queso blanco, queso fresco, Brie, Camembert cheeses, blue-veined cheeses, and Panela cheese (unless it is made with pasteurized milk, which must be stated on the label). Beverages Alcohol. Sugar-sweetened beverages, such as sodas, teas, or energy drinks. Seasoning and other foods Homemade fermented foods and drinks, such as pickles, sauerkraut, or kombucha drinks. (Store-bought pasteurized versions of these are okay.) Salads that are made in a store or deli, such as ham salad, chicken salad, egg salad, tuna salad, and seafood salad. The items listed above may not be a complete list of foods and beverages you should avoid. Contact a dietitian for more information. Where to find more information To calculate the number of calories you need based on your height, weight, and activity level, you can use an online calculator such as:  www.choosemyplate.gov/MyPlatePlan To calculate how much weight you should gain during pregnancy, you can use an online pregnancy weight gain calculator such as:  www.choosemyplate.gov/pregnancy-weight-gain-calculator Summary  While you are pregnant, your body requires additional nutrition to help support your growing baby.  Eat a variety of foods, especially fruits and vegetables to get a full range of vitamins and minerals.  Practice good food safety and cleanliness. Wash your hands before you eat and after you prepare raw meat. Wash all fruits and vegetables well before peeling or eating. Taking these actions can help to prevent food-borne illnesses, such as listeriosis, that can be very dangerous to your baby.  Do not eat raw meat or fish. Do not eat fish that have high mercury content, such as tilefish, shark, swordfish, and king mackerel. Do not eat unpasteurized (raw) dairy.  Take a prenatal vitamin to help meet your additional vitamin and  mineral needs during pregnancy, specifically for folic acid, iron, calcium, and vitamin D. This information is not intended to replace advice given to you by your health care provider. Make sure you discuss any questions you have with your health care provider. Document Revised: 07/30/2018 Document Reviewed: 12/06/2016 Elsevier Patient Education  2020 [employer]. Prenatal Care Prenatal care is health care during pregnancy. It helps you and your unborn baby (fetus) stay as healthy as possible. Prenatal care may be provided by a midwife, a family practice health care provider, or a childbirth and pregnancy specialist (obstetrician). How does this affect me? During pregnancy, you will be closely monitored   for any new conditions that might develop. To lower your risk of pregnancy complications, you and your health care provider will talk about any underlying conditions you have. How does this affect my baby? Early and consistent prenatal care increases the chance that your baby will be healthy during pregnancy. Prenatal care lowers the risk that your baby will be:  Born early (prematurely).  Smaller than expected at birth (small for gestational age). What can I expect at the first prenatal care visit? Your first prenatal care visit will likely be the longest. You should schedule your first prenatal care visit as soon as you know that you are pregnant. Your first visit is a good time to talk about any questions or concerns you have about pregnancy. At your visit, you and your health care provider will talk about:  Your medical history, including: ? Any past pregnancies. ? Your family's medical history. ? The baby's father's medical history. ? Any long-term (chronic) health conditions you have and how you manage them. ? Any surgeries or procedures you have had. ? Any current over-the-counter or prescription medicines, herbs, or supplements you are taking.  Other factors that could pose a risk  to your baby, including:  Your home setting and your stress levels, including: ? Exposure to abuse or violence. ? Household financial strain. ? Mental health conditions you have.  Your daily health habits, including diet and exercise. Your health care provider will also:  Measure your weight, height, and blood pressure.  Do a physical exam, including a pelvic and breast exam.  Perform blood tests and urine tests to check for: ? Urinary tract infection. ? Sexually transmitted infections (STIs). ? Low iron levels in your blood (anemia). ? Blood type and certain proteins on red blood cells (Rh antibodies). ? Infections and immunity to viruses, such as hepatitis B and rubella. ? HIV (human immunodeficiency virus).  Do an ultrasound to confirm your baby's growth and development and to help predict your estimated due date (EDD). This ultrasound is done with a probe that is inserted into the vagina (transvaginal ultrasound).  Discuss your options for genetic screening.  Give you information about how to keep yourself and your baby healthy, including: ? Nutrition and taking vitamins. ? Physical activity. ? How to manage pregnancy symptoms such as nausea and vomiting (morning sickness). ? Infections and substances that may be harmful to your baby and how to avoid them. ? Food safety. ? Dental care. ? Working. ? Travel. ? Warning signs to watch for and when to call your health care provider. How often will I have prenatal care visits? After your first prenatal care visit, you will have regular visits throughout your pregnancy. The visit schedule is often as follows:  Up to week 28 of pregnancy: once every 4 weeks.  28-36 weeks: once every 2 weeks.  After 36 weeks: every week until delivery. Some women may have visits more or less often depending on any underlying health conditions and the health of the baby. Keep all follow-up and prenatal care visits as told by your health care  provider. This is important. What happens during routine prenatal care visits? Your health care provider will:  Measure your weight and blood pressure.  Check for fetal heart sounds.  Measure the height of your uterus in your abdomen (fundal height). This may be measured starting around week 20 of pregnancy.  Check the position of your baby inside your uterus.  Ask questions about your diet, sleeping patterns, and   whether you can feel the baby move.  Review warning signs to watch for and signs of labor.  Ask about any pregnancy symptoms you are having and how you are dealing with them. Symptoms may include: ? Headaches. ? Nausea and vomiting. ? Vaginal discharge. ? Swelling. ? Fatigue. ? Constipation. ? Any discomfort, including back or pelvic pain. Make a list of questions to ask your health care provider at your routine visits. What tests might I have during prenatal care visits? You may have blood, urine, and imaging tests throughout your pregnancy, such as:  Urine tests to check for glucose, protein, or signs of infection.  Glucose tests to check for a form of diabetes that can develop during pregnancy (gestational diabetes mellitus). This is usually done around week 24 of pregnancy.  An ultrasound to check your baby's growth and development and to check for birth defects. This is usually done around week 20 of pregnancy.  A test to check for group B strep (GBS) infection. This is usually done around week 36 of pregnancy.  Genetic testing. This may include blood or imaging tests, such as an ultrasound. Some genetic tests are done during the first trimester and some are done during the second trimester. What else can I expect during prenatal care visits? Your health care provider may recommend getting certain vaccines during pregnancy. These may include:  A yearly flu shot (annual influenza vaccine). This is especially important if you will be pregnant during flu  season.  Tdap (tetanus, diphtheria, pertussis) vaccine. Getting this vaccine during pregnancy can protect your baby from whooping cough (pertussis) after birth. This vaccine may be recommended between weeks 27 and 36 of pregnancy. Later in your pregnancy, your health care provider may give you information about:  Childbirth and breastfeeding classes.  Choosing a health care provider for your baby.  Umbilical cord banking.  Breastfeeding.  Birth control after your baby is born.  The hospital labor and delivery unit and how to tour it.  Registering at the hospital before you go into labor. Where to find more information  Office on Women's Health: womenshealth.gov  American Pregnancy Association: americanpregnancy.org  March of Dimes: marchofdimes.org Summary  Prenatal care helps you and your baby stay as healthy as possible during pregnancy.  Your first prenatal care visit will most likely be the longest.  You will have visits and tests throughout your pregnancy to monitor your health and your baby's health.  Bring a list of questions to your visits to ask your health care provider.  Make sure to keep all follow-up and prenatal care visits with your health care provider. This information is not intended to replace advice given to you by your health care provider. Make sure you discuss any questions you have with your health care provider. Document Revised: 07/01/2018 Document Reviewed: 03/10/2017 Elsevier Patient Education  2020 [employer]  

## 2019-10-08 NOTE — Progress Notes (Signed)
NOB 

## 2019-10-08 NOTE — Progress Notes (Signed)
New Obstetric Patient H&P  Date of Service: 10/08/2019  Chief Complaint: "Desires prenatal care"   History of Present Illness: Patient is a 24 y.o. [id number] Not Hispanic or Latino female, presents with amenorrhea and positive home pregnancy test. Patient's last menstrual period was 08/07/2019. and based on 6 week ultrasound, her EDD is Estimated Date of Delivery: 05/22/20 and her EGA is [redacted]w[redacted]d. Cycles are 4-5 days, irregular due to IUD in place just prior to pregnancy. Her last pap smear was 2 years ago and was no abnormalities.    She had a urine pregnancy test which was positive 3 week(s)  ago. Her last menstrual period was normal and lasted for  4 or 5 day(s). Since her LMP she claims she has experienced fatigue, nausea. She denies vaginal bleeding. Her past medical history is noncontributory. Her prior pregnancies are notable for G1 2017, FT SVD, 7# female, G2 2020, FT SVD 8#5oz female.  Since her LMP, she admits to the use of tobacco products  Quit with +UPT She claims she has gained   10 pounds since the start of her pregnancy.  There are cats in the home in the home  no  She admits close contact with children on a regular basis  yes  She has had chicken pox in the past yes She has had Tuberculosis exposures, symptoms, or previously tested positive for TB   no Current or past history of domestic violence. no  Genetic Screening/Teratology Counseling: (Includes patient, baby's father, or anyone in either family with:)   1. Patient's age >/= 80 at [facility]  no 2. Thalassemia ([place], [place], Mediterranean, or Asian background): MCV<80  no 3. Neural tube defect (meningomyelocele, spina bifida, anencephaly)  no 4. Congenital heart defect  no  5. Down syndrome  no 6. Tay-Sachs (Jewish, [place]  no 7. Canavan's Disease  no 8. Sickle cell disease or trait (African)  no  9. Hemophilia or other blood disorders  no  10. Muscular dystrophy  no  11. Cystic fibrosis  no  12. Huntington's  Chorea  no  13. Mental retardation/autism  no 14. Other inherited genetic or chromosomal disorder  no 15. Maternal metabolic disorder (DM, PKU, etc)  no 16. Patient or FOB with a child with a birth defect not listed above no  16a. Patient or FOB with a birth defect themselves no 17. Recurrent pregnancy loss, or stillbirth  no  18. Any medications since LMP other than prenatal vitamins (include vitamins, supplements, OTC meds, drugs, alcohol)  No she quit taking zoloft and wellbutrin with +UPT. She had planned to start on weight loss medication- liraglutide and then found out she was pregnant 19. Any other genetic/environmental exposure to discuss  She admits carb heavy/processed foods diet and no motivation to change diet. Exercise is "not much"  Infection History:   1. Lives with someone with TB or TB exposed  no  2. Patient or partner has history of genital herpes  no 3. Rash or viral illness since LMP  no 4. History of STI (GC, CT, HPV, syphilis, HIV)  CT years ago/tx'ed 5. History of recent travel :  no  Other pertinent information:  no     Review of Systems:10 point review of systems negative unless otherwise noted in HPI  Past Medical History:  Patient Active Problem List   Diagnosis Date Noted  . Leukocytosis 08/27/2019  . Hyperglycemia 08/27/2019  . Depression with anxiety 02/10/2018  . Obesity affecting pregnancy 02/10/2018  . Supervision  of high risk pregnancy in first trimester 02/02/2018    Clinic [facility] Prenatal Labs  Dating LMP = 6 week [place] Blood type: B/Positive/-- (11/11 1540)   Genetic Screen NIPS: 86, 173, 130, 98 Antibody:Negative (11/11 1540)  Anatomic [place] Complete on 25 week follow, low lying placenta resolved Rubella: 10.80 (11/11 1540) Varicella: Immune  GTT Early 1-hr 149 3-hr 86, 173, 130, 98  Third trimester: 87, 190, 111, 112 RPR: Non Reactive (11/11 1540)   Rhogam N/A HBsAg: Negative (11/11 1540)   TDaP vaccine   07/13/2018     Flu Shot: HIV: Non  Reactive (11/11 1540)   Baby Food    Breast                           GBS:   Contraception IUD to bridge to tubal ligation IF has cesarean would like tubal ligation Signed consent 08/18/2018 Pap: 10/13/2017 NIL  CBB     CS/VBAC  hx vaginal delivery   Support Person         . Pregnancy occurring while using intrauterine contraceptive device (IUD) 02/02/2018  . Vitamin D deficiency 10/16/2017  . Depression 10/13/2017  . Anxiety 07/01/2016  . Morbid obesity (HCC) 09/23/2011    Past Surgical History:  Past Surgical History:  Procedure Laterality Date  . FRACTURE SURGERY     Left  . WISDOM TOOTH EXTRACTION      Gynecologic History: Patient's last menstrual period was 08/07/2019.  Obstetric History: [id number]  Family History:  History reviewed. No pertinent family history.  Social History:  Social History   Socioeconomic History  . Marital status: Married    Spouse name: Not on file  . Number of children: 2  . Years of education: Not on file  . Highest education level: High school graduate  Occupational History  . Not on file  Tobacco Use  . Smoking status: Former Smoker    Packs/day: 0.50    Types: Cigarettes    Start date: 04/01/2016    Quit date: 12/23/2017    Years since quitting: 1.7  . Smokeless tobacco: Never Used  . Tobacco comment: Had quit in 2017, started back in 2018 but quit when she found out she was pregnant.  Vaping Use  . Vaping Use: Never used  Substance and Sexual Activity  . Alcohol use: No    Comment: social-not often  . Drug use: No  . Sexual activity: Yes    Birth control/protection: None  Other Topics Concern  . Not on file  Social History Narrative  . Not on file   Social Determinants of Health   Financial Resource Strain:   . Difficulty of Paying Living Expenses:   Food Insecurity:   . Worried About Programme researcher, broadcasting/film/video in the Last Year:   . Barista in the Last Year:   Transportation Needs:   . Freight forwarder  (Medical):   Marland Kitchen Lack of Transportation (Non-Medical):   Physical Activity:   . Days of Exercise per Week:   . Minutes of Exercise per Session:   Stress:   . Feeling of Stress :   Social Connections:   . Frequency of Communication with Friends and Family:   . Frequency of Social Gatherings with Friends and Family:   . Attends Religious Services:   . Active Member of Clubs or Organizations:   . Attends Banker Meetings:   Marland Kitchen Marital Status:   Intimate  Partner Violence:   . Fear of Current or Ex-Partner:   . Emotionally Abused:   Marland Kitchen. Physically Abused:   . Sexually Abused:     Allergies:  Allergies  Allergen Reactions  . Other Itching    Vitamin E cream  . Diflucan [Fluconazole] Rash    Medications: Prior to Admission medications   Medication Sig Start Date End Date Taking? Authorizing Provider  famotidine (PEPCID) 20 MG tablet Take 20 mg by mouth 2 (two) times daily.   Yes [provider]    Physical Exam Vitals: Blood pressure 116/70, weight (!) 313 lb (142 kg), last menstrual period 08/07/2019  General: NAD HEENT: normocephalic, anicteric Thyroid: no enlargement, no palpable nodules Pulmonary: No increased work of breathing, CTAB Cardiovascular: RRR, distal pulses 2+ Abdomen: NABS, soft, non-tender, non-distended.  Umbilicus without lesions.  No hepatomegaly, splenomegaly or masses palpable. No evidence of hernia  Genitourinary: exam limited by body habitus  External: Normal external female genitalia.  Normal urethral meatus, normal Bartholin's and Skene's glands.    Vagina: Normal vaginal mucosa, no evidence of prolapse.    Cervix: not evaluated  Uterus: deferred    Adnexa: deferred  Rectal: deferred Extremities: no edema, erythema, or tenderness Neurologic: Grossly intact Psychiatric: mood appropriate, affect full   The following were addressed during this visit:  Breastfeeding Education - Early initiation of breastfeeding    Comments:  Keeps milk supply adequate, helps contract uterus and slow bleeding, and early milk is the perfect first food and is easy to digest.   - The importance of exclusive breastfeeding    Comments: Provides antibodies, Lower risk of breast and ovarian cancers, and type-2 diabetes,Helps your body recover, Reduced chance of SIDS.   - Risks of giving your baby anything other than breast milk if you are breastfeeding    Comments: Make the baby less content with breastfeeds, may make my baby more susceptible to illness, and may reduce my milk supply.   - The importance of early skin-to-skin contact    Comments: Keeps baby warm and secure, helps keep baby's blood sugar up and breathing steady, easier to bond and breastfeed, and helps calm baby.  - Rooming-in on a 24-hour basis    Comments: Easier to learn baby's feeding cues, easier to bond and get to know each other, and encourages milk production.   - Feeding on demand or baby-led feeding    Comments: Helps prevent breastfeeding complications, helps bring in good milk supply, prevents under or overfeeding, and helps baby feel content and satisfied   - Frequent feeding to help assure optimal milk production    Comments: Making a full supply of milk requires frequent removal of milk from breasts, infant will eat 8-12 times in 24 hours, if separated from infant use breast massage, hand expression and/ or pumping to remove milk from breasts.   - Effective positioning and attachment    Comments: Helps my baby to get enough breast milk, helps to produce an adequate milk supply, and helps prevent nipple pain and damage     Assessment: 24 y.o. G3P2002 at 8976w4d by 6 week ultrasound presenting to initiate prenatal care  Plan: 1) Avoid alcoholic beverages. 2) Patient encouraged not to smoke.  3) Discontinue the use of all non-medicinal drugs and chemicals.  4) Take prenatal vitamins daily.  5) Nutrition, food safety (fish, cheese advisories, and  high nitrite foods) and exercise discussed. 6) Hospital and practice style discussed with cross coverage system.  7) Genetic Screening, such  as with 1st Trimester Screening, cell free fetal DNA, AFP testing, and Ultrasound, as well as with amniocentesis and CVS as appropriate, is discussed with patient. At the conclusion of today's visit patient requested MaterniT 21 with add ons and Inheritest genetic testing 8) Patient is asked about travel to areas at risk for the Zika virus, and counseled to avoid travel and exposure to mosquitoes or sexual partners who may have themselves been exposed to the virus. Testing is discussed, and will be ordered as appropriate.  9) Aptima and urine culture today 10) Return to clinic in 4 weeks for Hgb A1C, NOB panel, MaterniT 21 Plus and Inheritest   [name], CNM [facility] 10/08/2019, 2:23 PM

## 2019-10-10 LAB — URINE CULTURE

## 2019-10-11 LAB — CHLAMYDIA/GONOCOCCUS/TRICHOMONAS, NAA
Chlamydia by NAA: NEGATIVE
Gonococcus by NAA: NEGATIVE
Trich vag by NAA: NEGATIVE

## 2019-10-15 ENCOUNTER — Ambulatory Visit: Payer: Managed Care, Other (non HMO) | Admitting: Internal Medicine

## 2019-10-22 ENCOUNTER — Ambulatory Visit: Payer: Managed Care, Other (non HMO) | Admitting: Dietician

## 2019-11-01 ENCOUNTER — Other Ambulatory Visit: Payer: Self-pay

## 2019-11-01 ENCOUNTER — Other Ambulatory Visit: Payer: Self-pay | Admitting: Obstetrics & Gynecology

## 2019-11-01 ENCOUNTER — Ambulatory Visit (INDEPENDENT_AMBULATORY_CARE_PROVIDER_SITE_OTHER): Payer: Managed Care, Other (non HMO)

## 2019-11-01 DIAGNOSIS — M545 Low back pain, unspecified: Secondary | ICD-10-CM

## 2019-11-01 LAB — POCT URINALYSIS DIPSTICK
Bilirubin, UA: NEGATIVE
Glucose, UA: NEGATIVE
Ketones, UA: NEGATIVE
Leukocytes, UA: NEGATIVE
Nitrite, UA: NEGATIVE
Protein, UA: NEGATIVE
Spec Grav, UA: 1.01 (ref 1.010–1.025)
Urobilinogen, UA: NEGATIVE E.U./dL — AB
pH, UA: 6 (ref 5.0–8.0)

## 2019-11-01 NOTE — Progress Notes (Signed)
Pt was told to come in and leave urine specimen.

## 2019-11-03 LAB — URINE CULTURE: Organism ID, Bacteria: NO GROWTH

## 2019-11-05 ENCOUNTER — Encounter: Payer: Self-pay | Admitting: Obstetrics

## 2019-11-05 ENCOUNTER — Ambulatory Visit (INDEPENDENT_AMBULATORY_CARE_PROVIDER_SITE_OTHER): Payer: Managed Care, Other (non HMO) | Admitting: Obstetrics

## 2019-11-05 ENCOUNTER — Encounter: Payer: Self-pay | Admitting: Dietician

## 2019-11-05 ENCOUNTER — Other Ambulatory Visit: Payer: Self-pay

## 2019-11-05 VITALS — BP 120/80 | Wt 305.0 lb

## 2019-11-05 DIAGNOSIS — Z113 Encounter for screening for infections with a predominantly sexual mode of transmission: Secondary | ICD-10-CM

## 2019-11-05 DIAGNOSIS — Z3A11 11 weeks gestation of pregnancy: Secondary | ICD-10-CM

## 2019-11-05 DIAGNOSIS — O0991 Supervision of high risk pregnancy, unspecified, first trimester: Secondary | ICD-10-CM

## 2019-11-05 DIAGNOSIS — O99211 Obesity complicating pregnancy, first trimester: Secondary | ICD-10-CM

## 2019-11-05 LAB — POCT URINALYSIS DIPSTICK OB
Glucose, UA: NEGATIVE
POC,PROTEIN,UA: NEGATIVE

## 2019-11-05 NOTE — Addendum Note (Signed)
Addended by: Cornelius Moras D on: 11/05/2019 08:48 AM   Modules accepted: Orders

## 2019-11-05 NOTE — Progress Notes (Signed)
  Routine Prenatal Care Visit  Subjective  Brittney Gill is a 24 y.o. G3P2002 at [redacted]w[redacted]d being seen today for ongoing prenatal care.  She is currently monitored for the following issues for this high-risk pregnancy and has Anxiety; Morbid obesity (HCC); Depression; Vitamin D deficiency; Supervision of high risk pregnancy in first trimester; Depression with anxiety; Obesity affecting pregnancy; BMI 50.0-59.9, adult (HCC); Leukocytosis; and Hyperglycemia on their problem list.  ----------------------------------------------------------------------------------- Patient reports backache.  This has occurred since her pregnancy was diagnosed.  Contractions: Not present. Vag. Bleeding: None.  Movement: Absent. Leaking Fluid denies.  ----------------------------------------------------------------------------------- The following portions of the patient's history were reviewed and updated as appropriate: allergies, current medications, past family history, past medical history, past social history, past surgical history and problem list. Problem list updated.  Objective  Blood pressure 120/80, weight (!) 305 lb (138.3 kg), last menstrual period 08/07/2019, unknown if currently breastfeeding. Pregravid weight 303 lb (137.4 kg) Total Weight Gain 2 lb (0.907 kg) Urinalysis: Urine Protein    Urine Glucose    Fetal Status:     Movement: Absent     General:  Alert, oriented and cooperative. Patient is in no acute distress.  Skin: Skin is warm and dry. No rash noted.   Cardiovascular: Normal heart rate noted  Respiratory: Normal respiratory effort, no problems with respiration noted  Abdomen: Soft, gravid, appropriate for gestational age. Pain/Pressure: Present     Pelvic:  Cervical exam deferred        Extremities: Normal range of motion.     Mental Status: Normal mood and affect. Normal behavior. Normal judgment and thought content.   Assessment   24 y.o. [id number] at [redacted]w[redacted]d by  05/22/2020, by  Ultrasound presenting for routine prenatal visit  Plan   pregnancy3# Problems (from 08/07/19 to present)    Problem Noted Resolved   Supervision of high risk pregnancy in first trimester 02/02/2018 by Conard Novak, MD No   Overview Addendum 10/09/2019  3:37 PM by [name], CNM    [facility]inic [facility] Prenatal Labs  Dating EDD by 6w u/s Blood type:     Genetic Screen  Antibody:   Anatomic [place]  Rubella:    Varicella: Immune  GTT Early Hgb A1C: Third Trimester: RPR:     Rhogam N/A HBsAg:     Vaccines TDAP:  07/13/2018     Flu Shot: HIV:     Baby Food Breast                           GBS:   GC/CT:  Contraception  Pap: 10/13/2017 NIL  CBB     CS/VBAC hx vaginal delivery   Support Person Amalia Hailey           Previous Version       Preterm labor symptoms and general obstetric precautions including but not limited to vaginal bleeding, contractions, leaking of fluid and fetal movement were reviewed in detail with the patient. Please refer to After Visit Summary for other counseling recommendations.  OB labs, including Inheritest and MaternT test today. Discussed using a maternity belt and Tylenol for her back. Consider PT referral.  Return in about 4 weeks (around 12/03/2019) for return OB.  Mirna Mires, CNM  11/05/2019 8:36 AM

## 2019-11-06 LAB — HGB A1C W/O EAG: Hgb A1c MFr Bld: 5.2 % (ref 4.8–5.6)

## 2019-11-06 LAB — RPR+RH+ABO+RUB AB+AB SCR+CB...
Antibody Screen: NEGATIVE
HIV Screen 4th Generation wRfx: NONREACTIVE
Hematocrit: 38.6 % (ref 34.0–46.6)
Hemoglobin: 12.4 g/dL (ref 11.1–15.9)
Hepatitis B Surface Ag: NEGATIVE
MCH: 27.5 pg (ref 26.6–33.0)
MCHC: 32.1 g/dL (ref 31.5–35.7)
MCV: 86 fL (ref 79–97)
Platelets: 371 10*3/uL (ref 150–450)
RBC: 4.51 x10E6/uL (ref 3.77–5.28)
RDW: 14.2 % (ref 11.7–15.4)
RPR Ser Ql: NONREACTIVE
Rh Factor: POSITIVE
Rubella Antibodies, IGG: 10.6 index (ref >0.99)
Varicella zoster IgG: 2442 index (ref >165)
WBC: 12.1 10*3/uL — ABNORMAL HIGH (ref 3.4–10.8)

## 2019-11-11 LAB — MATERNIT 21 PLUS CORE, BLOOD
Fetal Fraction: 4
Result (T21): NEGATIVE
Trisomy 13 (Patau syndrome): NEGATIVE
Trisomy 18 (Edwards syndrome): NEGATIVE
Trisomy 21 (Down syndrome): NEGATIVE

## 2019-11-19 ENCOUNTER — Telehealth: Payer: Self-pay

## 2019-11-19 NOTE — Telephone Encounter (Signed)
Spoke w/patient. She received Inheritest results thru [facility] portal. Advised results are not visible/ready in my chart. Provider unable to discuss until results are received.

## 2019-11-19 NOTE — Telephone Encounter (Signed)
Patient would like to speak to someone about lab results she received. Cb#[phone]

## 2019-11-20 LAB — INHERITEST CORE(CF97,SMA,FRAX)

## 2019-11-22 ENCOUNTER — Encounter: Payer: Self-pay | Admitting: Obstetrics

## 2019-11-22 ENCOUNTER — Other Ambulatory Visit: Payer: Self-pay | Admitting: Obstetrics

## 2019-11-22 DIAGNOSIS — O289 Unspecified abnormal findings on antenatal screening of mother: Secondary | ICD-10-CM | POA: Insufficient documentation

## 2019-11-22 DIAGNOSIS — O0991 Supervision of high risk pregnancy, unspecified, first trimester: Secondary | ICD-10-CM

## 2019-11-22 NOTE — Progress Notes (Signed)
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  She agrees to the genetics referral. Order put in for [facility] -MFM referral.  Mirna Mires, CNM  11/22/2019 9:54 AM

## 2019-11-25 ENCOUNTER — Telehealth: Payer: Self-pay | Admitting: Obstetrics and Gynecology

## 2019-11-25 NOTE — Telephone Encounter (Signed)
[name] stated that she cannot attend the genetic counseling visit on 12/02/19 in person.  I offered her the option of coming this afternoon or having her visit by telephone consultation either today or at the same time on 9/9.  She desires to keep the visit at that time, but change the format to telephone.  [name], MS, CGC

## 2019-12-02 ENCOUNTER — Ambulatory Visit: Payer: Managed Care, Other (non HMO) | Attending: Maternal & Fetal Medicine

## 2019-12-02 DIAGNOSIS — O285 Abnormal chromosomal and genetic finding on antenatal screening of mother: Secondary | ICD-10-CM

## 2019-12-02 DIAGNOSIS — Z3A15 15 weeks gestation of pregnancy: Secondary | ICD-10-CM

## 2019-12-02 DIAGNOSIS — Z148 Genetic carrier of other disease: Secondary | ICD-10-CM | POA: Insufficient documentation

## 2019-12-02 NOTE — Progress Notes (Signed)
Referring physician:  Lauralee Evener Ob/Gyn, M. Eunice Blase Length of consultation:  30 minutes  Ms. 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 Of note, she was negative for changes in the gene for Cystic Fibrosis and has normal results on carrier screening for Fragile X syndrome as well as MaterniT21 aneuploidy screening.  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 SMA is characterized by progressive muscle weakness and atrophy due to degeneration and loss of anterior horn cells (lower motor neurons) in the spinal cord and brain stem. We discussed the different types of SMA (0, I, II, and III), including differences in severity and age of onset.    Pathogenic variants in the SMN1 gene cause SMA. The number of copies of another related gene called SMN2 modifies the severity of the condition and helps determine which form of SMA an affected individual will develop. The SMN1 and SMN2 genes both provide instructions for the survival motor neuron (SMN) protein. This protein has important functions in the maintenance of motor neurons in the skeletal muscles that allow the body to move. Typically, most functional SMN protein is produced from the SMN1 gene, with a small amount produced from the SMN2 gene. Most individuals with SMA are missing a piece of the SMN1 gene which impairs SMN protein production. A shortage of SMN protein causes progressive motor neurons death, leading to the signs and symptoms of SMA. However, the SMN protein produced by the SMN2 gene can help make up for the protein deficiency caused by pathogenic variants in SMN1. Having multiple copies of the SMN2 gene is usually associated with less severe features and later onset of symptoms.   We reviewed that SMA is inherited in an autosomal recessive pattern. This means  that both parents must be carriers for the condition to be at risk of having an affected child. Based on the carrier frequency for SMA in the Caucasian population, Ms. Peri Jefferson Moore's partner currently has a 1 in 33 chance of being a carrier of SMA. If Mr. Christell Constant is found to have 2 or more copies of SMN1 on carrier screening, his risk of being a carrier is reduced but not eliminated. If both parents are carriers of SMA, there is a 1 in 4 (25%) chance of having an affected fetus with each pregnancy. We discussed that partner carrier screening for SMA is recommended to refine the couple's chance of having a child with SMA. Her partner, Debroah Loop (dob 12/07/94), came in to the clinic today for carrier testing to be performed.  Results should be available in approximately 2 weeks, and he requested that I contact Ms. Marvene Staff with his results.    If Mr. Christell Constant is identified as a carrier, we will have a more detailed discussion about available follow-up testing options, treatments, and options for future pregnancies.  Amniocentesis would be available as a diagnostic test during this pregnancy.  She was also informed that newborn screening is performed on all babies born in [place] and SMA is included in that testing.  Genetic testing for SMA via cord blood after delivery could also be considered.  We spoke briefly about treatments that exist for SMA. We discussed that several FDA-approved treatments that target the SMN1 and SMN2 genes are now available. Audie Clear is an SMA gene therapy administered via a one-time infusion  that replaces the defective or missing SMN1 gene with a working copy. This new gene increases SMN protein levels, which improves motor neuron function and increases the likelihood of survival. Spinraza (AKA nusinersen) and Evrysdi both increase the amount of functional SMN protein produced by the SMN2 gene.   It is recommended that Ms. Marvene Staff inform her siblings about her SMA carrier status, as her  half brothers have up to a 50% chance of being a carrier for SMA depending upon which of her parents she may have inherited this gene change from.  We also obtained about a detailed family history and pregnancy history.  This is the third pregnancy for Ms. Marvene Staff, the second with Amalia Hailey.  She has a healthy 45 year old son from a prior relationship and they have a 22 year old daughter who is also in good health, with normal growth and development.  There are no family members with developmental delays, birth defects, childhood death or known genetic conditions. One maternal great uncle is reported to have muscle wasting, though he is around 25 years old with onset in recent years.  As we reviewed, there are different forms of SMA with varying ages of onset and severity.  We would need additional medical information to determine if this history could be related to her SMA carrier status. In the pregnancy, Ms. Marvene Staff reported no complications or exposures that would be expected to increase the risk for birth defects.    We will be in touch as soon as the results are available for [facility].  We may be reached at [phone] with any questions or concerns.  [name], MS, CGC

## 2019-12-03 ENCOUNTER — Encounter: Payer: Self-pay | Admitting: Obstetrics & Gynecology

## 2019-12-03 ENCOUNTER — Other Ambulatory Visit: Payer: Self-pay

## 2019-12-03 ENCOUNTER — Ambulatory Visit (INDEPENDENT_AMBULATORY_CARE_PROVIDER_SITE_OTHER): Payer: Managed Care, Other (non HMO) | Admitting: Obstetrics & Gynecology

## 2019-12-03 VITALS — BP 120/80 | Wt 309.0 lb

## 2019-12-03 DIAGNOSIS — O0991 Supervision of high risk pregnancy, unspecified, first trimester: Secondary | ICD-10-CM

## 2019-12-03 DIAGNOSIS — Z3689 Encounter for other specified antenatal screening: Secondary | ICD-10-CM

## 2019-12-03 DIAGNOSIS — O99211 Obesity complicating pregnancy, first trimester: Secondary | ICD-10-CM

## 2019-12-03 DIAGNOSIS — Z3A15 15 weeks gestation of pregnancy: Secondary | ICD-10-CM

## 2019-12-03 NOTE — Progress Notes (Signed)
  Subjective  Fetal Movement? yes Contractions? no Leaking Fluid? no Vaginal Bleeding? no  Objective  BP 120/80   Wt (!) 309 lb (140.2 kg)   LMP 08/07/2019   BMI 51.42 kg/m  General: NAD Pumonary: no increased work of breathing Abdomen: gravid, non-tender Extremities: no edema Psychiatric: mood appropriate, affect full  Assessment  24 y.o. [id number] at [redacted]w[redacted]d by  05/22/2020, by Ultrasound presenting for routine prenatal visit  Plan   Problem List Items Addressed This Visit      Other   Supervision of high risk pregnancy in first trimester   Obesity affecting pregnancy   Relevant Orders   Ambulatory referral to Anesthesiology    Other Visit Diagnoses    [redacted] weeks gestation of pregnancy    -  Primary   Screening, antenatal, for fetal anatomic survey       Relevant Orders   US OB Comp + 14 Wk      pregnancy3# Problems (from 08/07/19 to present)    Problem Noted Resolved   Supervision of high risk pregnancy in first trimester 02/02/2018 by Conard Novak, MD No   Overview Addendum 11/15/2019  5:39 PM by Mirna Mires, CNM    Clinic [facility] Prenatal Labs  Dating EDD by 6w u/s Blood type:     Genetic Screen  Maternt-negative,female Antibody:   Anatomic [place]  Rubella:   immune Varicella: Immune  GTT Early Hgb A1C: Third Trimester: RPR:     Rhogam N/A HBsAg:     Vaccines TDAP:  07/13/2018     Flu Shot: HIV:     Baby Food Breast                           GBS:   GC/CT:  Contraception  Pap: 10/13/2017 NIL  CBB     CS/VBAC hx vaginal delivery   Support Person Amalia Hailey           Previous Version     [place] nv  Anes consult soon  See Genetic counseling note, awaiting husband testing  Annamarie Major, MD, [facility], [facility] 12/03/2019  4:52 PM

## 2019-12-03 NOTE — Patient Instructions (Addendum)
Thank you for choosing [facility]. As part of our ongoing efforts to improve patient experience, we would appreciate your feedback. Please fill out the short survey that you will receive by mail or MyChart. Your opinion is important to us! -Dr Masae Lukacs  Prenatal Ultrasound A prenatal ultrasound exam, also called a sonogram, is an imaging test that allows your health care provider to see your baby and placenta in the uterus. This is a safe and painless test that does not expose you or your baby to any X-rays, needles, or medicines. Prenatal ultrasounds are done using a handheld plastic device (transducer) that sends out sound waves (ultrasound). The sound waves reflect off your baby's bones and other tissues to create moving images on a computer screen. There are two types of prenatal ultrasound:  Transabdominal ultrasound. During this test, a transducer is placed on your belly and moved around. A routine transabdominal ultrasound is usually done between weeks 18 and 22 of pregnancy (standard ultrasound). It may also be done between weeks 13 and 14.  Transvaginal ultrasound. During this test, a transducer that is shaped like a wand is placed inside your vagina. This type of ultrasound is usually done during early pregnancy. Prenatal ultrasounds may be used to check:  How far along your pregnancy is (stage).  Your baby's development (gestational age).  The location and condition of the organ that supplies your baby with nourishment and oxygen (placenta).  Your baby's heart rate, position, and movements.  Your baby's approximate size and weight.  The amount of fluid surrounding your baby (amniotic fluid).  If you are carrying more than one baby.  Your baby's sex (if your baby is in a position that allows the sex organs to be seen, and if you choose to learn the sex at this time).  If there are any possible problems that require more testing, such as genetic problems.  If your pregnancy  is forming outside your uterus (ectopic pregnancy). You may have other ultrasounds as needed at any point during your pregnancy. If your health care provider suspects a problem, you may also have a more detailed type of transabdominal ultrasound (advanced ultrasound). What are the risks? Generally, this is a safe test. There are no known risks for you or your baby from a prenatal ultrasound. What happens before the test?  Before a transabdominal ultrasound, you may be asked to drink fluid 2 hours before the exam and avoid emptying your bladder. A full bladder helps the images show up more clearly.  Before a transvaginal ultrasound, you may be asked to empty your bladder before the exam.  Wear loose, comfortable clothing so it is easy to undress or expose your lower belly for the exam. What happens during the test? If you are having a transabdominal ultrasound:  You will lie on an exam table.  Your belly will be exposed.  Gel will be rubbed over your belly.  The transducer will be pressed on your belly and moved back and forth, through the gel. You may feel slight pressure, but there should not be any pain.  You may be asked to change your position.  You may hear sounds of blood flow and your baby's heartbeat. You may be able to see images of your baby on the computer screen. Your health care provider may measure your baby's head and other body parts, looking for normal development.  After the exam, the gel will be cleaned off, and you can replace your clothing. You will be   able to empty your bladder after the exam is done. If you are having a transvaginal ultrasound:  You will change into a hospital gown or undress from the waist down and cover yourself with a paper sheet.  You will lie down on an exam table with your feet in footrests (stirrups).  The transducer will be covered with a protective cover and lubricated.  The transducer will be inserted into your vagina.  You may  hear sounds of blood flow and your baby's heartbeat. You may be able to see images of your baby on the computer screen.  After the exam, the transducer will be removed, and you can put your clothes back on. What can I expect after the test?  You can drive yourself home and return to all your normal activities.  A health care provider trained in interpreting ultrasounds will review the images taken during your exam and send a report to your health care provider.  It is up to you to get your test results. Ask your health care provider, or the department that is doing the test, when your results will be ready. Questions to ask your health care provider  Why am I having this prenatal ultrasound?  What information will this exam provide?  How much does this exam cost? What costs will my insurance cover?  Can my partner or support person be with me during the exam?  When can I expect to get the results? Summary  A prenatal ultrasound is a safe and painless imaging exam that gives information about your pregnancy and your developing baby.  Transvaginal ultrasound exams are often done in early pregnancy. Standard transabdominal ultrasounds are typically done between 18 and 22 weeks of pregnancy. You may have other prenatal ultrasounds as needed.  This exam has no risks for you or your baby. After the exam, you can go home and return to all your usual activities. This information is not intended to replace advice given to you by your health care provider. Make sure you discuss any questions you have with your health care provider. Document Revised: 07/03/2018 Document Reviewed: 05/14/2017 Elsevier Patient Education  2020 [employer]  

## 2019-12-13 ENCOUNTER — Telehealth: Payer: Self-pay | Admitting: Obstetrics and Gynecology

## 2019-12-13 NOTE — Telephone Encounter (Signed)
The results of the SMA carrier screening for Brittney Gill are available and were given to his wife, Brittney Gill as her requested.  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  SMA is a recessive genetic condition with variable age of onset and severity caused by mutations in the SMN1 gene.  This carrier testing assesses the number of copies of this gene.  Persons with one copy of the SMN1 gene are carriers, and those with no copies are affected with the condition.  Individuals with two or more copies have a reduced chance to be a carrier.  Not all mutations can be detected with this testing, though it can detect 94.8% of carriers in the Caucasian population.  The results revealed that Brittney Gill has an SMN1 copy number of 2 and is negative for the c. *3+80T>G SNP, thus reducing his chance to be a carrier from 1 in 47 to 1 in 921. Given the results for this couple, the chance for their pregnancy to be affected with SMA is estimated to be 1 in 3684.  Again, this testing cannot eliminate the chance to have a child with SMA, but dramatically reduces the chance.    We encouraged the patient to call with any questions or concerns as they arise.  We may be reached at [phone].  Preet Perrier F. Lonnell Chaput, MS, CGC    

## 2019-12-31 ENCOUNTER — Ambulatory Visit (INDEPENDENT_AMBULATORY_CARE_PROVIDER_SITE_OTHER): Payer: Managed Care, Other (non HMO)

## 2019-12-31 ENCOUNTER — Ambulatory Visit (INDEPENDENT_AMBULATORY_CARE_PROVIDER_SITE_OTHER): Payer: Managed Care, Other (non HMO) | Admitting: Obstetrics and Gynecology

## 2019-12-31 ENCOUNTER — Other Ambulatory Visit: Payer: Self-pay | Admitting: Obstetrics and Gynecology

## 2019-12-31 ENCOUNTER — Encounter: Payer: Self-pay | Admitting: Obstetrics and Gynecology

## 2019-12-31 ENCOUNTER — Other Ambulatory Visit: Payer: Self-pay

## 2019-12-31 VITALS — BP 122/80 | Wt 305.0 lb

## 2019-12-31 DIAGNOSIS — Z9189 Other specified personal risk factors, not elsewhere classified: Secondary | ICD-10-CM

## 2019-12-31 DIAGNOSIS — O99212 Obesity complicating pregnancy, second trimester: Secondary | ICD-10-CM

## 2019-12-31 DIAGNOSIS — Z3A19 19 weeks gestation of pregnancy: Secondary | ICD-10-CM

## 2019-12-31 DIAGNOSIS — Z3689 Encounter for other specified antenatal screening: Secondary | ICD-10-CM | POA: Diagnosis not present

## 2019-12-31 DIAGNOSIS — O0991 Supervision of high risk pregnancy, unspecified, first trimester: Secondary | ICD-10-CM

## 2019-12-31 LAB — POCT URINALYSIS DIPSTICK OB
Glucose, UA: NEGATIVE
POC,PROTEIN,UA: NEGATIVE

## 2019-12-31 NOTE — Patient Instructions (Addendum)
Do you snore loudly? ( louder than talking or loud enough to be heard through closed doors?)  Do you often feel tired, fatigued, or sleepy during daytime?  Has anyone observed you stop breathing during sleep?  Do you have or are you being treated for high blood pressure?  BMI> 35  Age> 50  Neck circumference > 40 cm  Female gender?  ** if yes to > 3 questions high risk of obstructive sleep apnea ** if yes to <3 questions+ low risk for obstructive sleep apnea    Second Trimester of Pregnancy The second trimester is from week 14 through week 27 (months 4 through 6). The second trimester is often a time when you feel your best. Your body has adjusted to being pregnant, and you begin to feel better physically. Usually, morning sickness has lessened or quit completely, you may have more energy, and you may have an increase in appetite. The second trimester is also a time when the fetus is growing rapidly. At the end of the sixth month, the fetus is about 9 inches long and weighs about 1 pounds. You will likely begin to feel the baby move (quickening) between 16 and 20 weeks of pregnancy. Body changes during your second trimester Your body continues to go through many changes during your second trimester. The changes vary from woman to woman.  Your weight will continue to increase. You will notice your lower abdomen bulging out.  You may begin to get stretch marks on your hips, abdomen, and breasts.  You may develop headaches that can be relieved by medicines. The medicines should be approved by your health care provider.  You may urinate more often because the fetus is pressing on your bladder.  You may develop or continue to have heartburn as a result of your pregnancy.  You may develop constipation because certain hormones are causing the muscles that push waste through your intestines to slow down.  You may develop hemorrhoids or swollen, bulging veins (varicose veins).  You may  have back pain. This is caused by: ? Weight gain. ? Pregnancy hormones that are relaxing the joints in your pelvis. ? A shift in weight and the muscles that support your balance.  Your breasts will continue to grow and they will continue to become tender.  Your gums may bleed and may be sensitive to brushing and flossing.  Dark spots or blotches (chloasma, mask of pregnancy) may develop on your face. This will likely fade after the baby is born.  A dark line from your belly button to the pubic area (linea nigra) may appear. This will likely fade after the baby is born.  You may have changes in your hair. These can include thickening of your hair, rapid growth, and changes in texture. Some women also have hair loss during or after pregnancy, or hair that feels dry or thin. Your hair will most likely return to normal after your baby is born. What to expect at prenatal visits During a routine prenatal visit:  You will be weighed to make sure you and the fetus are growing normally.  Your blood pressure will be taken.  Your abdomen will be measured to track your baby's growth.  The fetal heartbeat will be listened to.  Any test results from the previous visit will be discussed. Your health care provider may ask you:  How you are feeling.  If you are feeling the baby move.  If you have had any abnormal symptoms, such as  leaking fluid, bleeding, severe headaches, or abdominal cramping.  If you are using any tobacco products, including cigarettes, chewing tobacco, and electronic cigarettes.  If you have any questions. Other tests that may be performed during your second trimester include:  Blood tests that check for: ? Low iron levels (anemia). ? High blood sugar that affects pregnant women (gestational diabetes) between 41 and 28 weeks. ? Rh antibodies. This is to check for a protein on red blood cells (Rh factor).  Urine tests to check for infections, diabetes, or protein in the  urine.  An ultrasound to confirm the proper growth and development of the baby.  An amniocentesis to check for possible genetic problems.  Fetal screens for spina bifida and Down syndrome.  HIV (human immunodeficiency virus) testing. Routine prenatal testing includes screening for HIV, unless you choose not to have this test. Follow these instructions at home: Medicines  Follow your health care provider's instructions regarding medicine use. Specific medicines may be either safe or unsafe to take during pregnancy.  Take a prenatal vitamin that contains at least 600 micrograms (mcg) of folic acid.  If you develop constipation, try taking a stool softener if your health care provider approves. Eating and drinking   Eat a balanced diet that includes fresh fruits and vegetables, whole grains, good sources of protein such as meat, eggs, or tofu, and low-fat dairy. Your health care provider will help you determine the amount of weight gain that is right for you.  Avoid raw meat and uncooked cheese. These carry germs that can cause birth defects in the baby.  If you have low calcium intake from food, talk to your health care provider about whether you should take a daily calcium supplement.  Limit foods that are high in fat and processed sugars, such as fried and sweet foods.  To prevent constipation: ? Drink enough fluid to keep your urine clear or pale yellow. ? Eat foods that are high in fiber, such as fresh fruits and vegetables, whole grains, and beans. Activity  Exercise only as directed by your health care provider. Most women can continue their usual exercise routine during pregnancy. Try to exercise for 30 minutes at least 5 days a week. Stop exercising if you experience uterine contractions.  Avoid heavy lifting, wear low heel shoes, and practice good posture.  A sexual relationship may be continued unless your health care provider directs you otherwise. Relieving pain and  discomfort  Wear a good support bra to prevent discomfort from breast tenderness.  Take warm sitz baths to soothe any pain or discomfort caused by hemorrhoids. Use hemorrhoid cream if your health care provider approves.  Rest with your legs elevated if you have leg cramps or low back pain.  If you develop varicose veins, wear support hose. Elevate your feet for 15 minutes, 3-4 times a day. Limit salt in your diet. Prenatal Care  Write down your questions. Take them to your prenatal visits.  Keep all your prenatal visits as told by your health care provider. This is important. Safety  Wear your seat belt at all times when driving.  Make a list of emergency phone numbers, including numbers for family, friends, the hospital, and police and fire departments. General instructions  Ask your health care provider for a referral to a local prenatal education class. Begin classes no later than the beginning of month 6 of your pregnancy.  Ask for help if you have counseling or nutritional needs during pregnancy. Your health care  provider can offer advice or refer you to specialists for help with various needs.  Do not use hot tubs, steam rooms, or saunas.  Do not douche or use tampons or scented sanitary pads.  Do not cross your legs for long periods of time.  Avoid cat litter boxes and soil used by cats. These carry germs that can cause birth defects in the baby and possibly loss of the fetus by miscarriage or stillbirth.  Avoid all smoking, herbs, alcohol, and unprescribed drugs. Chemicals in these products can affect the formation and growth of the baby.  Do not use any products that contain nicotine or tobacco, such as cigarettes and e-cigarettes. If you need help quitting, ask your health care provider.  Visit your dentist if you have not gone yet during your pregnancy. Use a soft toothbrush to brush your teeth and be gentle when you floss. Contact a health care provider if:  You  have dizziness.  You have mild pelvic cramps, pelvic pressure, or nagging pain in the abdominal area.  You have persistent nausea, vomiting, or diarrhea.  You have a bad smelling vaginal discharge.  You have pain when you urinate. Get help right away if:  You have a fever.  You are leaking fluid from your vagina.  You have spotting or bleeding from your vagina.  You have severe abdominal cramping or pain.  You have rapid weight gain or weight loss.  You have shortness of breath with chest pain.  You notice sudden or extreme swelling of your face, hands, ankles, feet, or legs.  You have not felt your baby move in over an hour.  You have severe headaches that do not go away when you take medicine.  You have vision changes. Summary  The second trimester is from week 14 through week 27 (months 4 through 6). It is also a time when the fetus is growing rapidly.  Your body goes through many changes during pregnancy. The changes vary from woman to woman.  Avoid all smoking, herbs, alcohol, and unprescribed drugs. These chemicals affect the formation and growth your baby.  Do not use any tobacco products, such as cigarettes, chewing tobacco, and e-cigarettes. If you need help quitting, ask your health care provider.  Contact your health care provider if you have any questions. Keep all prenatal visits as told by your health care provider. This is important. This information is not intended to replace advice given to you by your health care provider. Make sure you discuss any questions you have with your health care provider. Document Revised: 07/03/2018 Document Reviewed: 04/16/2016 Elsevier Patient Education  2020 [employer].

## 2019-12-31 NOTE — Progress Notes (Signed)
Routine Prenatal Care Visit  Subjective  Brittney Gill is a 24 y.o. G3P2002 at [redacted]w[redacted]d being seen today for ongoing prenatal care.  She is currently monitored for the following issues for this high-risk pregnancy and has Anxiety; Morbid obesity (HCC); Depression; Vitamin D deficiency; Supervision of high risk pregnancy in first trimester; Depression with anxiety; Obesity affecting pregnancy; BMI 50.0-59.9, adult (HCC); Leukocytosis; Hyperglycemia; Abnormal finding on antenatal screen; and Carrier of genetic disorder on their problem list.  ----------------------------------------------------------------------------------- Patient reports pain for several months at tailbone. She can not sit secondary to the pain..   Contractions: Not present. Vag. Bleeding: None.  Movement: Present. Denies leaking of fluid.  ----------------------------------------------------------------------------------- The following portions of the patient's history were reviewed and updated as appropriate: allergies, current medications, past family history, past medical history, past social history, past surgical history and problem list. Problem list updated.   Objective  Blood pressure 122/80, weight (!) 305 lb (138.3 kg), last menstrual period 08/07/2019, unknown if currently breastfeeding. Pregravid weight 303 lb (137.4 kg) Total Weight Gain 2 lb (0.907 kg) Urinalysis:      Fetal Status: Fetal Heart Rate (bpm): 147   Movement: Present     General:  Alert, oriented and cooperative. Patient is in no acute distress.  Skin: Skin is warm and dry. No rash noted.   Cardiovascular: Normal heart rate noted  Respiratory: Normal respiratory effort, no problems with respiration noted  Abdomen: Soft, gravid, appropriate for gestational age. Pain/Pressure: Absent     Pelvic:  Cervical exam deferred        Extremities: Normal range of motion.  Edema: None  Mental Status: Normal mood and affect. Normal behavior. Normal  judgment and thought content.     Assessment   24 y.o. [id number] at [redacted]w[redacted]d by  05/22/2020, by Ultrasound presenting for routine prenatal visit  Plan   pregnancy3# Problems (from 08/07/19 to present)    Problem Noted Resolved   Obesity affecting pregnancy 02/10/2018 by Conard Novak, MD No   Overview Addendum 12/31/2019  2:36 PM by Natale Milch, MD    Weight gain recommendation BMI> 30:  11-20 lbs  [x ] Screen of OSA- positive screen, referred for testing  [x]  Early diabetes screening for BMI>30  [x ] Consultation with Anesthesia- patient has upcoming appointment  Antenatal Testing BMI 35-39.9 Weekly at 37 weeks BMI 40 or > Weekly at 34 weeks        Previous Version   Supervision of high risk pregnancy in first trimester 02/02/2018 by 13/01/2018, MD No   Overview Addendum 12/31/2019  2:23 PM by 03/01/2020, MD     Nursing Staff Provider  Office Location  [facility] Dating   6 wk Natale Milch  Language  English Anatomy US   incomplete  Flu Vaccine  declined Genetic Screen   normal XX   TDaP vaccine    Hgb A1C or  GTT Early : hgba1c 5.2 Third trimester :   Rhogam   not needed   LAB RESULTS   Feeding Plan  Blood Type B/Positive/-- (08/13 0839)   Contraception  Antibody Negative (08/13 0839)  Circumcision  Rubella 10.60 (08/13 0839)  Pediatrician   RPR Non Reactive (08/13 0839)   Support Person  Dustin HBsAg Negative (08/13 0839)   Prenatal Classes  HIV Non Reactive (08/13 0839)    Varicella   BTL Consent  GBS  (For PCN allergy, check sensitivities)        VBAC  Consent  Pap  2019 NIL  Covid vaccine  vaccinated Hgb Electro      CF      SMA  carrier          High risk Pregnancy Diagnoses Obesity in pregnancy Low lying placenta        Previous Version      Repeat anatomy US next visit Discussed pelvic rest with low lying placenta Has upcoming anesthesia appointment Referred for sleep apnea screening  Do you snore loudly? ( louder than  talking or loud enough to be heard through closed doors?) YES Do you often feel tired, fatigued, or sleepy during daytime? YES Has anyone observed you stop breathing during sleep? NO Do you have or are you being treated for high blood pressure? NO BMI> 35 YES Age> 50 NO Neck circumference > 40 cm YES, 41 cm Female gender? NO ** if yes to > 3 questions high risk of obstructive sleep apnea ** if yes to <3 questions+ low risk for obstructive sleep apnea   Gestational age appropriate obstetric precautions including but not limited to vaginal bleeding, contractions, leaking of fluid and fetal movement were reviewed in detail with the patient.    Return in about 2 weeks (around 01/14/2020) for ROB in person and anatomy US follow up.  Natale Milch MD [facility], [facility] 12/31/2019, 2:37 PM

## 2019-12-31 NOTE — Progress Notes (Signed)
Pt state that her tailbone hurts. PT declined flu shot.

## 2020-01-04 ENCOUNTER — Other Ambulatory Visit: Admission: RE | Admit: 2020-01-04 | Payer: Managed Care, Other (non HMO) | Source: Ambulatory Visit

## 2020-01-21 ENCOUNTER — Ambulatory Visit (INDEPENDENT_AMBULATORY_CARE_PROVIDER_SITE_OTHER): Payer: Managed Care, Other (non HMO)

## 2020-01-21 ENCOUNTER — Other Ambulatory Visit: Payer: Self-pay

## 2020-01-21 ENCOUNTER — Ambulatory Visit (INDEPENDENT_AMBULATORY_CARE_PROVIDER_SITE_OTHER): Payer: Managed Care, Other (non HMO) | Admitting: Obstetrics

## 2020-01-21 VITALS — BP 110/66 | Wt 306.0 lb

## 2020-01-21 DIAGNOSIS — O0992 Supervision of high risk pregnancy, unspecified, second trimester: Secondary | ICD-10-CM | POA: Diagnosis not present

## 2020-01-21 DIAGNOSIS — Z3A22 22 weeks gestation of pregnancy: Secondary | ICD-10-CM | POA: Diagnosis not present

## 2020-01-21 DIAGNOSIS — O0991 Supervision of high risk pregnancy, unspecified, first trimester: Secondary | ICD-10-CM

## 2020-01-21 DIAGNOSIS — Z23 Encounter for immunization: Secondary | ICD-10-CM

## 2020-01-21 DIAGNOSIS — O0990 Supervision of high risk pregnancy, unspecified, unspecified trimester: Secondary | ICD-10-CM

## 2020-01-21 LAB — POCT URINALYSIS DIPSTICK OB: Glucose, UA: NEGATIVE

## 2020-01-21 NOTE — Progress Notes (Signed)
C/o tailbone pain - was rx'd prednisone by [facility]; pt hasn't taken it d/t not sure if okay for pregnancy.rj

## 2020-01-21 NOTE — Progress Notes (Signed)
Routine Prenatal Care Visit  Subjective  Brittney Gill is a 24 y.o. G3P2002 at [redacted]w[redacted]d being seen today for ongoing prenatal care.  She is currently monitored for the following issues for this high-risk pregnancy and has Anxiety; Morbid obesity (HCC); Depression; Vitamin D deficiency; Supervision of high risk pregnancy in first trimester; Depression with anxiety; Obesity affecting pregnancy; BMI 50.0-59.9, adult (HCC); Leukocytosis; Hyperglycemia; Abnormal finding on antenatal screen; and Carrier of genetic disorder on their problem list.  ----------------------------------------------------------------------------------- Patient reports no complaints.    .  .   Pincus Large Fluid denies.  ----------------------------------------------------------------------------------- The following portions of the patient's history were reviewed and updated as appropriate: allergies, current medications, past family history, past medical history, past social history, past surgical history and problem list. Problem list updated.  Objective  Blood pressure 110/66, weight (!) 306 lb (138.8 kg), last menstrual period 08/07/2019, unknown if currently breastfeeding. Pregravid weight 303 lb (137.4 kg) Total Weight Gain 3 lb (1.361 kg) Urinalysis: Urine Protein Trace  Urine Glucose Negative  Fetal Status:           General:  Alert, oriented and cooperative. Patient is in no acute distress.  Skin: Skin is warm and dry. No rash noted.   Cardiovascular: Normal heart rate noted  Respiratory: Normal respiratory effort, no problems with respiration noted  Abdomen: Soft, gravid, appropriate for gestational age.       Pelvic:  Cervical exam deferred        Extremities: Normal range of motion.     Mental Status: Normal mood and affect. Normal behavior. Normal judgment and thought content.   Assessment   24 y.o. [id number] at [redacted]w[redacted]d by  05/22/2020, by Ultrasound presenting for routine prenatal visit  Plan    pregnancy3# Problems (from 08/07/19 to present)    Problem Noted Resolved   Obesity affecting pregnancy 02/10/2018 by Conard Novak, MD No   Overview Addendum 12/31/2019  2:36 PM by Natale Milch, MD    Weight gain recommendation BMI> 30:  11-20 lbs  [x ] Screen of OSA- positive screen, referred for testing  [x]  Early diabetes screening for BMI>30  [x ] Consultation with Anesthesia- patient has upcoming appointment  Antenatal Testing BMI 35-39.9 Weekly at 37 weeks BMI 40 or > Weekly at 34 weeks        Previous Version   Supervision of high risk pregnancy in first trimester 02/02/2018 by 13/01/2018, MD No   Overview Addendum 01/21/2020  2:23 PM by 01/23/2020, CNM     Nursing Staff Provider  Office Location  [facility] Dating   6 wk Mirna Mires  Language  English Anatomy US   incomplete  Flu Vaccine  declined Genetic Screen   normal XX   TDaP vaccine    Hgb A1C or  GTT Early : hgba1c 5.2 Third trimester :   Rhogam   not needed   LAB RESULTS   Feeding Plan  Blood Type B/Positive/-- (08/13 0839)   Contraception  Antibody Negative (08/13 0839)  Circumcision  Rubella 10.60 (08/13 0839)  Pediatrician   RPR Non Reactive (08/13 0839)   Support Person  Dustin HBsAg Negative (08/13 0839)   Prenatal Classes  HIV Non Reactive (08/13 0839)    Varicella   BTL Consent  GBS  (For PCN allergy, check sensitivities)        VBAC Consent  Pap  2019 NIL  Covid vaccine  vaccinated Hgb Electro      CF  SMA  carrier     Varicella immune     High risk Pregnancy Diagnoses Obesity in pregnancy Low lying placenta        Previous Version       Preterm labor symptoms and general obstetric precautions including but not limited to vaginal bleeding, contractions, leaking of fluid and fetal movement were reviewed in detail with the patient. Please refer to After Visit Summary for other counseling recommendations.  Her repeat anatomy scan is reviewed with her. Normal  anatomy. Gently addressed the import of limited wt gain in pregnancy.Encouraged her to walk at least 5 x weekly; try eating "Lean and green", replace most beverages with water, sugar free drinks.  Return in about 4 weeks (around 02/18/2020) for return OB with MD if possible.  Mirna Mires, CNM  01/21/2020 2:25 PM

## 2020-02-04 NOTE — Telephone Encounter (Signed)
Please r/s Anesthesiology Consult. Patient missed 01/04/20 apt.

## 2020-02-10 ENCOUNTER — Observation Stay
Admission: EM | Admit: 2020-02-10 | Discharge: 2020-02-10 | Disposition: A | Discharge status: Home / Self Care | Payer: Managed Care, Other (non HMO) | Attending: Obstetrics and Gynecology | Admitting: Obstetrics and Gynecology

## 2020-02-10 ENCOUNTER — Other Ambulatory Visit: Payer: Self-pay

## 2020-02-10 ENCOUNTER — Encounter: Payer: Self-pay | Admitting: Obstetrics and Gynecology

## 2020-02-10 DIAGNOSIS — O368190 Decreased fetal movements, unspecified trimester, not applicable or unspecified: Secondary | ICD-10-CM | POA: Diagnosis present

## 2020-02-10 DIAGNOSIS — O368120 Decreased fetal movements, second trimester, not applicable or unspecified: Secondary | ICD-10-CM | POA: Diagnosis not present

## 2020-02-10 DIAGNOSIS — Z3A25 25 weeks gestation of pregnancy: Secondary | ICD-10-CM | POA: Diagnosis not present

## 2020-02-10 DIAGNOSIS — Z87891 Personal history of nicotine dependence: Secondary | ICD-10-CM | POA: Diagnosis not present

## 2020-02-10 DIAGNOSIS — O0991 Supervision of high risk pregnancy, unspecified, first trimester: Secondary | ICD-10-CM

## 2020-02-10 DIAGNOSIS — O99212 Obesity complicating pregnancy, second trimester: Secondary | ICD-10-CM

## 2020-02-10 DIAGNOSIS — O368121 Decreased fetal movements, second trimester, fetus 1: Principal | ICD-10-CM | POA: Insufficient documentation

## 2020-02-10 NOTE — OB Triage Note (Signed)
Pt presents to L&D c/o decreased fetal movement throughout the day and no fetal movement for 2 hours despite drinking water, laying on left side, and eating. Pt denies pain, bleeding or LOF. Pt reports positive fetal movement since being put on the monitors. VSS. Will continue to monitor.

## 2020-02-10 NOTE — Discharge Summary (Signed)
Obstetric H&P   Chief Complaint: Decreased fetal movement  Prenatal Care Provider: [facility]  History of Present Illness: 24 y.o. [id number] [redacted]w[redacted]d by 05/22/2020, by Ultrasound presenting to L&D with decreased fetal movement, reports no movement over the past 2-hrs.  No LOF, no ctx, no LOF.  Pregnancy uncomplicated ths far other than low lying placenta with follow planned at 32 weeks as well as obesity.  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     Pregravid weight 137.4 kg Total Weight Gain 0.907 kg  pregnancy3# Problems (from 08/07/19 to present)    Problem Noted Resolved   Obesity affecting pregnancy 02/10/2018 by Conard Novak, MD No   Overview Addendum 12/31/2019  2:36 PM by Natale Milch, MD    Weight gain recommendation BMI> 30:  11-20 lbs  [x ] Screen of OSA- positive screen, referred for testing  [x]  Early diabetes screening for BMI>30  [x ] Consultation with Anesthesia- patient has upcoming appointment  Antenatal Testing BMI 35-39.9 Weekly at 37 weeks BMI 40 or > Weekly at 34 weeks        Previous Version   Supervision of high risk pregnancy in first trimester 02/02/2018 by 13/01/2018, MD No   Overview Addendum 01/21/2020  2:23 PM by 01/23/2020, CNM     Nursing Staff Provider  Office Location  [facility] Dating   6 wk Mirna Mires  Language  English Anatomy US   incomplete  Flu Vaccine  declined Genetic Screen   normal XX   TDaP vaccine    Hgb A1C or  GTT Early : hgba1c 5.2 Third trimester :   Rhogam   not needed   LAB RESULTS   Feeding Plan  Blood Type B/Positive/-- (08/13 0839)   Contraception  Antibody Negative (08/13 0839)  Circumcision  Rubella 10.60 (08/13 0839)  Pediatrician   RPR Non Reactive (08/13 0839)   Support Person  Dustin HBsAg Negative (08/13 0839)   Prenatal Classes  HIV Non Reactive (08/13 0839)    Varicella   BTL Consent  GBS  (For PCN allergy, check sensitivities)        VBAC Consent  Pap  2019 NIL  Covid vaccine  vaccinated Hgb Electro       CF      SMA  carrier     Varicella immune     High risk Pregnancy Diagnoses Obesity in pregnancy Low lying placenta        Previous Version       Review of Systems: 10 point review of systems negative unless otherwise noted in HPI  Past Medical History: Patient Active Problem List   Diagnosis Date Noted  . Decreased fetal movement 02/10/2020  . 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    . 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 Genetic referral made 11/22/2019   . Leukocytosis 08/27/2019  . Hyperglycemia 08/27/2019  . Depression with anxiety 02/10/2018  . Obesity affecting pregnancy 02/10/2018    Weight gain recommendation BMI> 30:  11-20 lbs  [x ] Screen of OSA- positive screen, referred for testing  [x]  Early diabetes screening for BMI>30  [x ] Consultation with Anesthesia- patient has upcoming appointment  Antenatal Testing BMI 35-39.9 Weekly at 37 weeks BMI 40 or > Weekly at 34 weeks     . BMI 50.0-59.9, adult (HCC) 02/10/2018  .  Supervision of high risk pregnancy in first trimester 02/02/2018     Nursing Staff Provider  Office Location  [facility] Dating   6 wk [place]  Language  English Anatomy US   incomplete  Flu Vaccine  declined Genetic Screen   normal XX   TDaP vaccine    Hgb A1C or  GTT Early : hgba1c 5.2 Third trimester :   Rhogam   not needed   LAB RESULTS   Feeding Plan  Blood Type B/Positive/-- (08/13 0839)   Contraception  Antibody Negative (08/13 0839)  Circumcision  Rubella 10.60 (08/13 0839)  Pediatrician   RPR Non Reactive (08/13 0839)   Support Person  Dustin HBsAg Negative (08/13 0839)   Prenatal Classes  HIV Non Reactive (08/13 0839)    Varicella   BTL Consent  GBS  (For PCN allergy, check sensitivities)        VBAC Consent  Pap  2019 NIL  Covid vaccine  vaccinated  Hgb Electro      CF      SMA  carrier     Varicella immune     High risk Pregnancy Diagnoses Obesity in pregnancy Low lying placenta     . Vitamin D deficiency 10/16/2017  . Depression 10/13/2017  . Anxiety 07/01/2016  . Morbid obesity (HCC) 09/23/2011    Past Surgical History: Past Surgical History:  Procedure Laterality Date  . FRACTURE SURGERY     Left  . WISDOM TOOTH EXTRACTION      Past Obstetric History: # 1 - Date: 01/25/16, Sex: Female, Weight: 3175 g, GA: [redacted]w[redacted]d, Delivery: Vaginal, Spontaneous, Apgar1: 8, Apgar5: 9, Living: Living, Birth Comments: None  # 2 - Date: 09/22/18, Sex: Female, Weight: 3780 g, GA: [redacted]w[redacted]d, Delivery: Vaginal, Spontaneous, Apgar1: 9, Apgar5: 9, Living: Living, Birth Comments: None  # 3 - Date: None, Sex: None, Weight: None, GA: None, Delivery: None, Apgar1: None, Apgar5: None, Living: None, Birth Comments: None   Past Gynecologic History:  Family History: History reviewed. No pertinent family history.  Social History: Social History   Socioeconomic History  . Marital status: Married    Spouse name: Not on file  . Number of children: 2  . Years of education: Not on file  . Highest education level: High school graduate  Occupational History  . Not on file  Tobacco Use  . Smoking status: Former Smoker    Packs/day: 0.50    Types: Cigarettes    Start date: 04/01/2016    Quit date: 12/23/2017    Years since quitting: 2.1  . Smokeless tobacco: Never Used  . Tobacco comment: Had quit in 2017, started back in 2018 but quit when she found out she was pregnant.  Vaping Use  . Vaping Use: Never used  Substance and Sexual Activity  . Alcohol use: No    Comment: social-not often  . Drug use: No  . Sexual activity: Yes    Birth control/protection: Surgical    Comment: vasectomy  Other Topics Concern  . Not on file  Social History Narrative  . Not on file   Social Determinants of Health   Financial Resource Strain:   . Difficulty  of Paying Living Expenses: Not on file  Food Insecurity:   . Worried About Programme researcher, broadcasting/film/video in the Last Year: Not on file  . Ran Out of Food in the Last Year: Not on file  Transportation Needs:   . Lack of Transportation (Medical): Not on file  . Lack of  Transportation (Non-Medical): Not on file  Physical Activity:   . Days of Exercise per Week: Not on file  . Minutes of Exercise per Session: Not on file  Stress:   . Feeling of Stress : Not on file  Social Connections:   . Frequency of Communication with Friends and Family: Not on file  . Frequency of Social Gatherings with Friends and Family: Not on file  . Attends Religious Services: Not on file  . Active Member of Clubs or Organizations: Not on file  . Attends Banker Meetings: Not on file  . Marital Status: Not on file  Intimate Partner Violence:   . Fear of Current or Ex-Partner: Not on file  . Emotionally Abused: Not on file  . Physically Abused: Not on file  . Sexually Abused: Not on file    Medications: Prior to Admission medications   Medication Sig Start Date End Date Taking? Authorizing Provider  famotidine (PEPCID) 20 MG tablet Take 20 mg by mouth 2 (two) times daily.   Yes [provider]  Prenatal Vit-Fe Fumarate-FA (MULTIVITAMIN-PRENATAL) 27-0.8 MG TABS tablet Take 1 tablet by mouth daily at 12 noon.   Yes [provider]    Allergies: Allergies  Allergen Reactions  . Other Itching    Vitamin E cream  . Diflucan [Fluconazole] Rash    Physical Exam: Vitals: Blood pressure 137/71, pulse 87, temperature 98.6 F (37 C), temperature source Oral, resp. rate 16, height 5\' 5"  (1.651 m), weight (!) 138.3 kg, last menstrual period 08/07/2019, unknown if currently breastfeeding.  Urine Dip Protein: N/A  FHT: 150, moderate, +accels, no decels Toco: none  General: NAD HEENT: normocephalic, anicteric Pulmonary: No increased work of breathing Cardiovascular: RRR, distal pulses  2+ Abdomen: Gravid, non-tender Genitourinary: deferred Extremities: no edema, erythema, or tenderness Neurologic: Grossly intact Psychiatric: mood appropriate, affect full  Labs: No results found for this or any previous visit (from the past 24 hour(s)).  Assessment: 24 y.o. 22 [redacted]w[redacted]d by 05/22/2020, by Ultrasound  With decreased fetal movement  Plan: 1) Decreased fetal movement - reactive NST patient reassured.  2) Fetus - reactive NST <32 week criteria  3) PNL - Blood type B/Positive/-- (08/13 0839) / Anti-bodyscreen Negative (08/13 0839) / Rubella 10.60 (08/13 0839) / Varicella Immune / RPR Non Reactive (08/13 0839) / HBsAg Negative (08/13 12-28-2005) / HIV Non Reactive (08/13 0839)  4) Immunization History -  Immunization History  Administered Date(s) Administered  . Influenza,inj,Quad PF,6+ Mos 01/21/2020  . Meningococcal Conjugate 09/19/2014  . Moderna SARS-COVID-2 Vaccination 06/28/2019, 07/26/2019  . PPD Test 11/25/2012, 09/19/2014  . Tdap 11/25/2012, 07/13/2018    5) Disposition - home  07/15/2018, MD, [facility], [facility] 02/10/2020, 9:30 PM

## 2020-02-10 NOTE — Discharge Summary (Signed)
RN reviewed discharge instructions with patient. Gave pt opportunity for questions. No questions at this time. Pt verbalized understanding. Pt discharged home with family member.

## 2020-02-14 ENCOUNTER — Other Ambulatory Visit: Payer: Managed Care, Other (non HMO)

## 2020-02-25 ENCOUNTER — Other Ambulatory Visit: Payer: Self-pay

## 2020-02-25 ENCOUNTER — Ambulatory Visit (INDEPENDENT_AMBULATORY_CARE_PROVIDER_SITE_OTHER): Payer: Managed Care, Other (non HMO) | Admitting: Obstetrics and Gynecology

## 2020-02-25 VITALS — BP 112/60 | Wt 312.0 lb

## 2020-02-25 DIAGNOSIS — O0990 Supervision of high risk pregnancy, unspecified, unspecified trimester: Secondary | ICD-10-CM

## 2020-02-25 DIAGNOSIS — Z3A27 27 weeks gestation of pregnancy: Secondary | ICD-10-CM

## 2020-02-25 DIAGNOSIS — O289 Unspecified abnormal findings on antenatal screening of mother: Secondary | ICD-10-CM

## 2020-02-25 DIAGNOSIS — O99212 Obesity complicating pregnancy, second trimester: Secondary | ICD-10-CM

## 2020-02-25 NOTE — Progress Notes (Signed)
Routine Prenatal Care Visit  Subjective  Brittney Gill is a 24 y.o. G3P2002 at [redacted]w[redacted]d being seen today for ongoing prenatal care.  She is currently monitored for the following issues for this high-risk pregnancy and has Anxiety; Morbid obesity (HCC); Depression; Vitamin D deficiency; Supervision of high risk pregnancy, antepartum; Depression with anxiety; Obesity affecting pregnancy; BMI 50.0-59.9, adult (HCC); Leukocytosis; Hyperglycemia; Abnormal finding on antenatal screen; Carrier of genetic disorder; and Decreased fetal movement on their problem list.  ----------------------------------------------------------------------------------- Patient reports no complaints.   Contractions: Not present. Vag. Bleeding: None.  Movement: Present. Denies leaking of fluid.  ----------------------------------------------------------------------------------- The following portions of the patient's history were reviewed and updated as appropriate: allergies, current medications, past family history, past medical history, past social history, past surgical history and problem list. Problem list updated.   Objective  Blood pressure 112/60, weight (!) 312 lb (141.5 kg), last menstrual period 08/07/2019, unknown if currently breastfeeding. Pregravid weight 303 lb (137.4 kg) Total Weight Gain 9 lb (4.082 kg) Urinalysis:      Fetal Status: Fetal Heart Rate (bpm): 145 Fundal Height: 30 cm Movement: Present     General:  Alert, oriented and cooperative. Patient is in no acute distress.  Skin: Skin is warm and dry. No rash noted.   Cardiovascular: Normal heart rate noted  Respiratory: Normal respiratory effort, no problems with respiration noted  Abdomen: Soft, gravid, appropriate for gestational age. Pain/Pressure: Absent     Pelvic:  Cervical exam deferred        Extremities: Normal range of motion.     ental Status: Normal mood and affect. Normal behavior. Normal judgment and thought content.      Assessment   24 y.o. [id number] at [redacted]w[redacted]d by  05/22/2020, by Ultrasound presenting for routine prenatal visit  Plan   pregnancy3# Problems (from 08/07/19 to present)    Problem Noted Resolved   Obesity affecting pregnancy 02/10/2018 by Conard Novak, MD No   Overview Addendum 12/31/2019  2:36 PM by Natale Milch, MD    Weight gain recommendation BMI> 30:  11-20 lbs  [x ] Screen of OSA- positive screen, referred for testing  [x]  Early diabetes screening for BMI>30  [x ] Consultation with Anesthesia- patient has upcoming appointment  Antenatal Testing BMI 35-39.9 Weekly at 37 weeks BMI 40 or > Weekly at 34 weeks        Previous Version   Supervision of high risk pregnancy in first trimester 02/02/2018 by 13/01/2018, MD No   Overview Addendum 01/21/2020  2:23 PM by 01/23/2020, CNM     Nursing Staff Provider  Office Location  [facility] Dating   6 wk Mirna Mires  Language  English Anatomy US   incomplete  Flu Vaccine  declined Genetic Screen   normal XX   TDaP vaccine    Hgb A1C or  GTT Early : hgba1c 5.2 Third trimester :   Rhogam   not needed   LAB RESULTS   Feeding Plan  Blood Type B/Positive/-- (08/13 0839)   Contraception  Antibody Negative (08/13 0839)  Circumcision  Rubella 10.60 (08/13 0839)  Pediatrician   RPR Non Reactive (08/13 0839)   Support Person  Dustin HBsAg Negative (08/13 0839)   Prenatal Classes  HIV Non Reactive (08/13 0839)    Varicella   BTL Consent  GBS  (For PCN allergy, check sensitivities)        VBAC Consent  Pap  2019 NIL  Covid vaccine  vaccinated Hgb Electro      CF      SMA  carrier     Varicella immune     High risk Pregnancy Diagnoses Obesity in pregnancy Low lying placenta        Previous Version       Gestational age appropriate obstetric precautions including but not limited to vaginal bleeding, contractions, leaking of fluid and fetal movement were reviewed in detail with the patient.    Return in  about 1 week (around 03/03/2020) for ROB 28 week labs and growth scan.  Vena Austria, MD, [facility], [facility] 02/25/2020, 4:16 PM

## 2020-02-25 NOTE — Progress Notes (Signed)
ROB - no concerns. RM 5 

## 2020-02-25 NOTE — Progress Notes (Signed)
ROB, no concerns. RM 5

## 2020-03-10 ENCOUNTER — Ambulatory Visit (INDEPENDENT_AMBULATORY_CARE_PROVIDER_SITE_OTHER): Payer: Managed Care, Other (non HMO) | Admitting: Advanced Practice Midwife

## 2020-03-10 ENCOUNTER — Other Ambulatory Visit: Payer: Managed Care, Other (non HMO)

## 2020-03-10 ENCOUNTER — Other Ambulatory Visit: Payer: Self-pay

## 2020-03-10 ENCOUNTER — Ambulatory Visit (INDEPENDENT_AMBULATORY_CARE_PROVIDER_SITE_OTHER): Payer: Managed Care, Other (non HMO)

## 2020-03-10 ENCOUNTER — Encounter: Payer: Self-pay | Admitting: Advanced Practice Midwife

## 2020-03-10 VITALS — BP 130/80 | Wt 313.0 lb

## 2020-03-10 DIAGNOSIS — Z3A32 32 weeks gestation of pregnancy: Secondary | ICD-10-CM | POA: Diagnosis not present

## 2020-03-10 DIAGNOSIS — O99212 Obesity complicating pregnancy, second trimester: Secondary | ICD-10-CM | POA: Diagnosis not present

## 2020-03-10 DIAGNOSIS — O0993 Supervision of high risk pregnancy, unspecified, third trimester: Secondary | ICD-10-CM

## 2020-03-10 DIAGNOSIS — O0990 Supervision of high risk pregnancy, unspecified, unspecified trimester: Secondary | ICD-10-CM

## 2020-03-10 DIAGNOSIS — O99213 Obesity complicating pregnancy, third trimester: Secondary | ICD-10-CM

## 2020-03-10 DIAGNOSIS — Z3A29 29 weeks gestation of pregnancy: Secondary | ICD-10-CM

## 2020-03-10 LAB — POCT URINALYSIS DIPSTICK OB
Glucose, UA: NEGATIVE
POC,PROTEIN,UA: NEGATIVE

## 2020-03-10 NOTE — Progress Notes (Signed)
Routine Prenatal Care Visit  Subjective  Brittney Gill is a 25 y.o. G3P2002 at [redacted]w[redacted]d being seen today for ongoing prenatal care.  She is currently monitored for the following issues for this high-risk pregnancy and has Anxiety; Morbid obesity (HCC); Depression; Vitamin D deficiency; Supervision of high risk pregnancy, antepartum; Depression with anxiety; Obesity affecting pregnancy; BMI 50.0-59.9, adult (HCC); Leukocytosis; Hyperglycemia; Abnormal finding on antenatal screen; Carrier of genetic disorder; and Decreased fetal movement on their problem list.  ----------------------------------------------------------------------------------- Patient reports no complaints.  She denies headache, visual changes or epigastric pain. We discussed results of today's growth scan and repeat in 4 weeks.  Contractions: Not present. Vag. Bleeding: None.  Movement: Present. Leaking Fluid denies.  ----------------------------------------------------------------------------------- The following portions of the patient's history were reviewed and updated as appropriate: allergies, current medications, past family history, past medical history, past social history, past surgical history and problem list. Problem list updated.  Objective  Blood pressure 130/80, weight (!) 313 lb (142 kg), last menstrual period 08/07/2019, unknown if currently breastfeeding. Pregravid weight 303 lb (137.4 kg) Total Weight Gain 10 lb (4.536 kg) Urinalysis: Urine Protein    Urine Glucose    Fetal Status: Fetal Heart Rate (bpm): 156   Movement: Present  Presentation: Vertex   Growth scan: 85.5%, AC 95.7%, 4 pounds 3 ounces, AFI: 16.1 cm, cephalic  General:  Alert, oriented and cooperative. Patient is in no acute distress.  Skin: Skin is warm and dry. No rash noted.   Cardiovascular: Normal heart rate noted  Respiratory: Normal respiratory effort, no problems with respiration noted  Abdomen: Soft, gravid, appropriate for  gestational age. Pain/Pressure: Absent     Pelvic:  Cervical exam deferred        Extremities: Normal range of motion.     Mental Status: Normal mood and affect. Normal behavior. Normal judgment and thought content.   Assessment   24 y.o. [id number] at [redacted]w[redacted]d by  05/22/2020, by Ultrasound presenting for routine prenatal visit  Plan   pregnancy3# Problems (from 08/07/19 to present)    Problem Noted Resolved   Obesity affecting pregnancy 02/10/2018 by Conard Novak, MD No   Overview Addendum 12/31/2019  2:36 PM by Natale Milch, MD    Weight gain recommendation BMI> 30:  11-20 lbs  [x ] Screen of OSA- positive screen, referred for testing  [x]  Early diabetes screening for BMI>30  [x ] Consultation with Anesthesia- patient has upcoming appointment  Antenatal Testing BMI 35-39.9 Weekly at 37 weeks BMI 40 or > Weekly at 34 weeks        Previous Version   Supervision of high risk pregnancy, antepartum 02/02/2018 by 13/01/2018, MD No   Overview Addendum 01/21/2020  2:23 PM by 01/23/2020, CNM     Nursing Staff Provider  Office Location  [facility] Dating   6 wk Mirna Mires  Language  English Anatomy US   incomplete  Flu Vaccine  declined Genetic Screen   normal XX   TDaP vaccine    Hgb A1C or  GTT Early : hgba1c 5.2 Third trimester :   Rhogam   not needed   LAB RESULTS   Feeding Plan  Blood Type B/Positive/-- (08/13 0839)   Contraception  Antibody Negative (08/13 0839)  Circumcision  Rubella 10.60 (08/13 0839)  Pediatrician   RPR Non Reactive (08/13 0839)   Support Person  Dustin HBsAg Negative (08/13 0839)   Prenatal Classes  HIV Non Reactive (08/13 12-28-2005)    Varicella  BTL Consent  GBS  (For PCN allergy, check sensitivities)        VBAC Consent  Pap  2019 NIL  Covid vaccine  vaccinated Hgb Electro      CF      SMA  carrier     Varicella immune     High risk Pregnancy Diagnoses Obesity in pregnancy Low lying placenta        Previous Version        Preterm labor symptoms and general obstetric precautions including but not limited to vaginal bleeding, contractions, leaking of fluid and fetal movement were reviewed in detail with the patient. Please refer to After Visit Summary for other counseling recommendations.   Return in about 2 weeks (around 03/24/2020) for rob.  [name], CNM 03/10/2020 11:17 AM

## 2020-03-10 NOTE — Patient Instructions (Signed)
Third Trimester of Pregnancy The third trimester is from week 28 through week 40 (months 7 through 9). The third trimester is a time when the unborn baby (fetus) is growing rapidly. At the end of the ninth month, the fetus is about 20 inches in length and weighs 6-10 pounds. Body changes during your third trimester Your body will continue to go through many changes during pregnancy. The changes vary from woman to woman. During the third trimester:  Your weight will continue to increase. You can expect to gain 25-35 pounds (11-16 kg) by the end of the pregnancy.  You may begin to get stretch marks on your hips, abdomen, and breasts.  You may urinate more often because the fetus is moving lower into your pelvis and pressing on your bladder.  You may develop or continue to have heartburn. This is caused by increased hormones that slow down muscles in the digestive tract.  You may develop or continue to have constipation because increased hormones slow digestion and cause the muscles that push waste through your intestines to relax.  You may develop hemorrhoids. These are swollen veins (varicose veins) in the rectum that can itch or be painful.  You may develop swollen, bulging veins (varicose veins) in your legs.  You may have increased body aches in the pelvis, back, or thighs. This is due to weight gain and increased hormones that are relaxing your joints.  You may have changes in your hair. These can include thickening of your hair, rapid growth, and changes in texture. Some women also have hair loss during or after pregnancy, or hair that feels dry or thin. Your hair will most likely return to normal after your baby is born.  Your breasts will continue to grow and they will continue to become tender. A yellow fluid (colostrum) may leak from your breasts. This is the first milk you are producing for your baby.  Your belly button may stick out.  You may notice more swelling in your hands,  face, or ankles.  You may have increased tingling or numbness in your hands, arms, and legs. The skin on your belly may also feel numb.  You may feel short of breath because of your expanding uterus.  You may have more problems sleeping. This can be caused by the size of your belly, increased need to urinate, and an increase in your body's metabolism.  You may notice the fetus "dropping," or moving lower in your abdomen (lightening).  You may have increased vaginal discharge.  You may notice your joints feel loose and you may have pain around your pelvic bone. What to expect at prenatal visits You will have prenatal exams every 2 weeks until week 36. Then you will have weekly prenatal exams. During a routine prenatal visit:  You will be weighed to make sure you and the baby are growing normally.  Your blood pressure will be taken.  Your abdomen will be measured to track your baby's growth.  The fetal heartbeat will be listened to.  Any test results from the previous visit will be discussed.  You may have a cervical check near your due date to see if your cervix has softened or thinned (effaced).  You will be tested for Group B streptococcus. This happens between 35 and 37 weeks. Your health care provider may ask you:  What your birth plan is.  How you are feeling.  If you are feeling the baby move.  If you have had any abnormal   symptoms, such as leaking fluid, bleeding, severe headaches, or abdominal cramping.  If you are using any tobacco products, including cigarettes, chewing tobacco, and electronic cigarettes.  If you have any questions. Other tests or screenings that may be performed during your third trimester include:  Blood tests that check for low iron levels (anemia).  Fetal testing to check the health, activity level, and growth of the fetus. Testing is done if you have certain medical conditions or if there are problems during the pregnancy.  Nonstress test  (NST). This test checks the health of your baby to make sure there are no signs of problems, such as the baby not getting enough oxygen. During this test, a belt is placed around your belly. The baby is made to move, and its heart rate is monitored during movement. What is false labor? False labor is a condition in which you feel small, irregular tightenings of the muscles in the womb (contractions) that usually go away with rest, changing position, or drinking water. These are called Braxton Hicks contractions. Contractions may last for hours, days, or even weeks before true labor sets in. If contractions come at regular intervals, become more frequent, increase in intensity, or become painful, you should see your health care provider. What are the signs of labor?  Abdominal cramps.  Regular contractions that start at 10 minutes apart and become stronger and more frequent with time.  Contractions that start on the top of the uterus and spread down to the lower abdomen and back.  Increased pelvic pressure and dull back pain.  A watery or bloody mucus discharge that comes from the vagina.  Leaking of amniotic fluid. This is also known as your "water breaking." It could be a slow trickle or a gush. Let your health care provider know if it has a color or strange odor. If you have any of these signs, call your health care provider right away, even if it is before your due date. Follow these instructions at home: Medicines  Follow your health care provider's instructions regarding medicine use. Specific medicines may be either safe or unsafe to take during pregnancy.  Take a prenatal vitamin that contains at least 600 micrograms (mcg) of folic acid.  If you develop constipation, try taking a stool softener if your health care provider approves. Eating and drinking   Eat a balanced diet that includes fresh fruits and vegetables, whole grains, good sources of protein such as meat, eggs, or tofu,  and low-fat dairy. Your health care provider will help you determine the amount of weight gain that is right for you.  Avoid raw meat and uncooked cheese. These carry germs that can cause birth defects in the baby.  If you have low calcium intake from food, talk to your health care provider about whether you should take a daily calcium supplement.  Eat four or five small meals rather than three large meals a day.  Limit foods that are high in fat and processed sugars, such as fried and sweet foods.  To prevent constipation: ? Drink enough fluid to keep your urine clear or pale yellow. ? Eat foods that are high in fiber, such as fresh fruits and vegetables, whole grains, and beans. Activity  Exercise only as directed by your health care provider. Most women can continue their usual exercise routine during pregnancy. Try to exercise for 30 minutes at least 5 days a week. Stop exercising if you experience uterine contractions.  Avoid heavy lifting.  Do   not exercise in extreme heat or humidity, or at high altitudes.  Wear low-heel, comfortable shoes.  Practice good posture.  You may continue to have sex unless your health care provider tells you otherwise. Relieving pain and discomfort  Take frequent breaks and rest with your legs elevated if you have leg cramps or low back pain.  Take warm sitz baths to soothe any pain or discomfort caused by hemorrhoids. Use hemorrhoid cream if your health care provider approves.  Wear a good support bra to prevent discomfort from breast tenderness.  If you develop varicose veins: ? Wear support pantyhose or compression stockings as told by your healthcare provider. ? Elevate your feet for 15 minutes, 3-4 times a day. Prenatal care  Write down your questions. Take them to your prenatal visits.  Keep all your prenatal visits as told by your health care provider. This is important. Safety  Wear your seat belt at all times when driving.  Make  a list of emergency phone numbers, including numbers for family, friends, the hospital, and police and fire departments. General instructions  Avoid cat litter boxes and soil used by cats. These carry germs that can cause birth defects in the baby. If you have a cat, ask someone to clean the litter box for you.  Do not travel far distances unless it is absolutely necessary and only with the approval of your health care provider.  Do not use hot tubs, steam rooms, or saunas.  Do not drink alcohol.  Do not use any products that contain nicotine or tobacco, such as cigarettes and e-cigarettes. If you need help quitting, ask your health care provider.  Do not use any medicinal herbs or unprescribed drugs. These chemicals affect the formation and growth of the baby.  Do not douche or use tampons or scented sanitary pads.  Do not cross your legs for long periods of time.  To prepare for the arrival of your baby: ? Take prenatal classes to understand, practice, and ask questions about labor and delivery. ? Make a trial run to the hospital. ? Visit the hospital and tour the maternity area. ? Arrange for maternity or paternity leave through employers. ? Arrange for family and friends to take care of pets while you are in the hospital. ? Purchase a rear-facing car seat and make sure you know how to install it in your car. ? Pack your hospital bag. ? Prepare the baby's nursery. Make sure to remove all pillows and stuffed animals from the baby's crib to prevent suffocation.  Visit your dentist if you have not gone during your pregnancy. Use a soft toothbrush to brush your teeth and be gentle when you floss. Contact a health care provider if:  You are unsure if you are in labor or if your water has broken.  You become dizzy.  You have mild pelvic cramps, pelvic pressure, or nagging pain in your abdominal area.  You have lower back pain.  You have persistent nausea, vomiting, or  diarrhea.  You have an unusual or bad smelling vaginal discharge.  You have pain when you urinate. Get help right away if:  Your water breaks before 37 weeks.  You have regular contractions less than 5 minutes apart before 37 weeks.  You have a fever.  You are leaking fluid from your vagina.  You have spotting or bleeding from your vagina.  You have severe abdominal pain or cramping.  You have rapid weight loss or weight gain.  You have   shortness of breath with chest pain.  You notice sudden or extreme swelling of your face, hands, ankles, feet, or legs.  Your baby makes fewer than 10 movements in 2 hours.  You have severe headaches that do not go away when you take medicine.  You have vision changes. Summary  The third trimester is from week 28 through week 40, months 7 through 9. The third trimester is a time when the unborn baby (fetus) is growing rapidly.  During the third trimester, your discomfort may increase as you and your baby continue to gain weight. You may have abdominal, leg, and back pain, sleeping problems, and an increased need to urinate.  During the third trimester your breasts will keep growing and they will continue to become tender. A yellow fluid (colostrum) may leak from your breasts. This is the first milk you are producing for your baby.  False labor is a condition in which you feel small, irregular tightenings of the muscles in the womb (contractions) that eventually go away. These are called Braxton Hicks contractions. Contractions may last for hours, days, or even weeks before true labor sets in.  Signs of labor can include: abdominal cramps; regular contractions that start at 10 minutes apart and become stronger and more frequent with time; watery or bloody mucus discharge that comes from the vagina; increased pelvic pressure and dull back pain; and leaking of amniotic fluid. This information is not intended to replace advice given to you by your  health care provider. Make sure you discuss any questions you have with your health care provider. Document Revised: 07/02/2018 Document Reviewed: 04/16/2016 Elsevier Patient Education  2020 [employer]  

## 2020-03-11 ENCOUNTER — Other Ambulatory Visit: Payer: Self-pay | Admitting: Obstetrics and Gynecology

## 2020-03-11 ENCOUNTER — Encounter: Payer: Self-pay | Admitting: Obstetrics and Gynecology

## 2020-03-11 DIAGNOSIS — O99013 Anemia complicating pregnancy, third trimester: Secondary | ICD-10-CM | POA: Insufficient documentation

## 2020-03-11 LAB — 28 WEEK RH+PANEL
Basophils Absolute: 0 10*3/uL (ref 0.0–0.2)
Basos: 0 %
EOS (ABSOLUTE): 0 10*3/uL (ref 0.0–0.4)
Eos: 0 %
Gestational Diabetes Screen: 119 mg/dL (ref 65–139)
HIV Screen 4th Generation wRfx: NONREACTIVE
Hematocrit: 31.8 % — ABNORMAL LOW (ref 34.0–46.6)
Hemoglobin: 10.3 g/dL — ABNORMAL LOW (ref 11.1–15.9)
Immature Grans (Abs): 0.1 10*3/uL (ref 0.0–0.1)
Immature Granulocytes: 1 %
Lymphocytes Absolute: 2.4 10*3/uL (ref 0.7–3.1)
Lymphs: 16 %
MCH: 26.2 pg — ABNORMAL LOW (ref 26.6–33.0)
MCHC: 32.4 g/dL (ref 31.5–35.7)
MCV: 81 fL (ref 79–97)
Monocytes Absolute: 0.7 10*3/uL (ref 0.1–0.9)
Monocytes: 5 %
Neutrophils Absolute: 11.6 10*3/uL — ABNORMAL HIGH (ref 1.4–7.0)
Neutrophils: 78 %
Platelets: 412 10*3/uL (ref 150–450)
RBC: 3.93 x10E6/uL (ref 3.77–5.28)
RDW: 12.5 % (ref 11.7–15.4)
RPR Ser Ql: NONREACTIVE
WBC: 14.9 10*3/uL — ABNORMAL HIGH (ref 3.4–10.8)

## 2020-03-11 MED ORDER — FERROUS SULFATE 325 (65 FE) MG PO TABS: 325.0000 mg | ORAL_TABLET | Freq: Every day | ORAL | 3 refills | Status: DC

## 2020-03-15 ENCOUNTER — Encounter
Admission: RE | Admit: 2020-03-15 | Discharge: 2020-03-15 | Disposition: A | Discharge status: Home / Self Care | Payer: Managed Care, Other (non HMO) | Source: Ambulatory Visit | Attending: Obstetrics & Gynecology | Admitting: Obstetrics & Gynecology

## 2020-03-15 ENCOUNTER — Other Ambulatory Visit: Payer: Self-pay

## 2020-03-15 NOTE — Consult Note (Signed)
[facility] Anesthesia Consultation  MARGA GRAMAJO JWJ:[id number] DOB: Jul 11, 1995 DOA: 03/15/2020 PCP: Jamelle Haring, MD   Requesting physician: Dr. Tiburcio Pea Date of consultation: 03/15/20 Reason for consultation: Obesity during pregnancy  CHIEF COMPLAINT:  Obesity during pregnancy  HISTORY OF PRESENT ILLNESS: Nailani Full  is a 24 y.o. female with a known history of obesity during pregnancy and OSA.  She does not use a CPAP.  She presents today for anesthesia evaluation in anticipation of labor and delivery.  PAST MEDICAL HISTORY:   Past Medical History:  Diagnosis Date  . Anxiety   . Depression   . Pregnancy occurring while using intrauterine contraceptive device (IUD) 02/02/2018    PAST SURGICAL HISTORY:  Past Surgical History:  Procedure Laterality Date  . FRACTURE SURGERY     Left  . WISDOM TOOTH EXTRACTION      SOCIAL HISTORY:  Social History   Tobacco Use  . Smoking status: Former Smoker    Packs/day: 0.50    Types: Cigarettes    Start date: 04/01/2016    Quit date: 12/23/2017    Years since quitting: 2.2  . Smokeless tobacco: Never Used  . Tobacco comment: Had quit in 2017, started back in 2018 but quit when she found out she was pregnant.  Substance Use Topics  . Alcohol use: No    Comment: social-not often    FAMILY HISTORY: No family history on file.  DRUG ALLERGIES:  Allergies  Allergen Reactions  . Other Itching    Vitamin E cream  . Diflucan [Fluconazole] Rash    REVIEW OF SYSTEMS:   RESPIRATORY: No cough, shortness of breath, wheezing.  CARDIOVASCULAR: No chest pain, orthopnea, edema.  HEMATOLOGY: No anemia, easy bruising or bleeding SKIN: No rash or lesion. NEUROLOGIC: No tingling, numbness, weakness.   MEDICATIONS AT HOME:  Prior to Admission medications   Medication Sig Start Date End Date Taking? Authorizing Provider  famotidine (PEPCID) 20 MG tablet Take 20 mg by mouth 2 (two)  times daily.    [provider]  ferrous sulfate (FERROUSUL) 325 (65 FE) MG tablet Take 1 tablet (325 mg total) by mouth daily with breakfast. 03/11/20   Vena Austria, MD  Prenatal Vit-Fe Fumarate-FA (MULTIVITAMIN-PRENATAL) 27-0.8 MG TABS tablet Take 1 tablet by mouth daily at 12 noon.    [provider]      PHYSICAL EXAMINATION:   VITAL SIGNS: Last menstrual period 08/07/2019, unknown if currently breastfeeding.  GENERAL:  24 y.o.-year-old patient no acute distress.  HEENT: Head atraumatic, normocephalic. Oropharynx and nasopharynx clear. MP 1, TM distance >3 cm, normal mouth opening. +overbite LUNGS: No use of accessory muscles of respiration.   EXTREMITIES: No pedal edema, cyanosis, or clubbing.  NEUROLOGIC: normal gait PSYCHIATRIC: The patient is alert and oriented x 3.  SKIN: No obvious rash, lesion, or ulcer.    IMPRESSION AND PLAN:   Florene Brill  is a 24 y.o. female presenting with obesity during pregnancy. BMI is currently 51.9 at [redacted] weeks gestation.   We discussed analgesic options during labor including epidural analgesia. Discussed that in obesity there can be increased difficulty with epidural placement or even failure of successful epidural. We also discussed that even after successful epidural placement there is increased risk of catheter migration out of the epidural space that would require catheter replacement. Discussed use of epidural vs spinal vs GA if cesarean delivery is required. Discussed increased risk of difficult intubation during pregnancy should an emergency cesarean delivery be required.  Airway evaluation is currently reassuring. We discussed repeat evaluation at 35-36 weeks by anesthesia to determine whether there is a high risk of complications of anesthesia for which we would recommend transfer of OB care to a facility with a higher maternal level of care designation.  Pt is in agreement with this plan.  Karleen Hampshire,  MD

## 2020-03-23 ENCOUNTER — Encounter: Payer: Self-pay | Admitting: Obstetrics & Gynecology

## 2020-03-23 ENCOUNTER — Ambulatory Visit (INDEPENDENT_AMBULATORY_CARE_PROVIDER_SITE_OTHER): Payer: Managed Care, Other (non HMO) | Admitting: Obstetrics & Gynecology

## 2020-03-23 ENCOUNTER — Other Ambulatory Visit: Payer: Self-pay

## 2020-03-23 VITALS — BP 120/80 | Wt 314.0 lb

## 2020-03-23 DIAGNOSIS — Z23 Encounter for immunization: Secondary | ICD-10-CM | POA: Diagnosis not present

## 2020-03-23 DIAGNOSIS — Z3A31 31 weeks gestation of pregnancy: Secondary | ICD-10-CM

## 2020-03-23 DIAGNOSIS — O0993 Supervision of high risk pregnancy, unspecified, third trimester: Secondary | ICD-10-CM

## 2020-03-23 DIAGNOSIS — O0990 Supervision of high risk pregnancy, unspecified, unspecified trimester: Secondary | ICD-10-CM

## 2020-03-23 DIAGNOSIS — O99213 Obesity complicating pregnancy, third trimester: Secondary | ICD-10-CM

## 2020-03-23 LAB — POCT URINALYSIS DIPSTICK OB
Glucose, UA: NEGATIVE
POC,PROTEIN,UA: NEGATIVE

## 2020-03-23 NOTE — Patient Instructions (Signed)
Thank you for choosing [facility]. As part of our ongoing efforts to improve patient experience, we would appreciate your feedback. Please fill out the short survey that you will receive by mail or MyChart. Your opinion is important to us! -Dr Crystalina Stodghill   Third Trimester of Pregnancy The third trimester is from week 28 through week 40 (months 7 through 9). The third trimester is a time when the unborn baby (fetus) is growing rapidly. At the end of the ninth month, the fetus is about 20 inches in length and weighs 6-10 pounds. Body changes during your third trimester Your body will continue to go through many changes during pregnancy. The changes vary from woman to woman. During the third trimester:  Your weight will continue to increase. You can expect to gain 25-35 pounds (11-16 kg) by the end of the pregnancy.  You may begin to get stretch marks on your hips, abdomen, and breasts.  You may urinate more often because the fetus is moving lower into your pelvis and pressing on your bladder.  You may develop or continue to have heartburn. This is caused by increased hormones that slow down muscles in the digestive tract.  You may develop or continue to have constipation because increased hormones slow digestion and cause the muscles that push waste through your intestines to relax.  You may develop hemorrhoids. These are swollen veins (varicose veins) in the rectum that can itch or be painful.  You may develop swollen, bulging veins (varicose veins) in your legs.  You may have increased body aches in the pelvis, back, or thighs. This is due to weight gain and increased hormones that are relaxing your joints.  You may have changes in your hair. These can include thickening of your hair, rapid growth, and changes in texture. Some women also have hair loss during or after pregnancy, or hair that feels dry or thin. Your hair will most likely return to normal after your baby is born.  Your  breasts will continue to grow and they will continue to become tender. A yellow fluid (colostrum) may leak from your breasts. This is the first milk you are producing for your baby.  Your belly button may stick out.  You may notice more swelling in your hands, face, or ankles.  You may have increased tingling or numbness in your hands, arms, and legs. The skin on your belly may also feel numb.  You may feel short of breath because of your expanding uterus.  You may have more problems sleeping. This can be caused by the size of your belly, increased need to urinate, and an increase in your body's metabolism.  You may notice the fetus "dropping," or moving lower in your abdomen (lightening).  You may have increased vaginal discharge.  You may notice your joints feel loose and you may have pain around your pelvic bone. What to expect at prenatal visits You will have prenatal exams every 2 weeks until week 36. Then you will have weekly prenatal exams. During a routine prenatal visit:  You will be weighed to make sure you and the baby are growing normally.  Your blood pressure will be taken.  Your abdomen will be measured to track your baby's growth.  The fetal heartbeat will be listened to.  Any test results from the previous visit will be discussed.  You may have a cervical check near your due date to see if your cervix has softened or thinned (effaced).  You will be   tested for Group B streptococcus. This happens between 35 and 37 weeks. Your health care provider may ask you:  What your birth plan is.  How you are feeling.  If you are feeling the baby move.  If you have had any abnormal symptoms, such as leaking fluid, bleeding, severe headaches, or abdominal cramping.  If you are using any tobacco products, including cigarettes, chewing tobacco, and electronic cigarettes.  If you have any questions. Other tests or screenings that may be performed during your third  trimester include:  Blood tests that check for low iron levels (anemia).  Fetal testing to check the health, activity level, and growth of the fetus. Testing is done if you have certain medical conditions or if there are problems during the pregnancy.  Nonstress test (NST). This test checks the health of your baby to make sure there are no signs of problems, such as the baby not getting enough oxygen. During this test, a belt is placed around your belly. The baby is made to move, and its heart rate is monitored during movement. What is false labor? False labor is a condition in which you feel small, irregular tightenings of the muscles in the womb (contractions) that usually go away with rest, changing position, or drinking water. These are called Braxton Hicks contractions. Contractions may last for hours, days, or even weeks before true labor sets in. If contractions come at regular intervals, become more frequent, increase in intensity, or become painful, you should see your health care provider. What are the signs of labor?  Abdominal cramps.  Regular contractions that start at 10 minutes apart and become stronger and more frequent with time.  Contractions that start on the top of the uterus and spread down to the lower abdomen and back.  Increased pelvic pressure and dull back pain.  A watery or bloody mucus discharge that comes from the vagina.  Leaking of amniotic fluid. This is also known as your "water breaking." It could be a slow trickle or a gush. Let your health care provider know if it has a color or strange odor. If you have any of these signs, call your health care provider right away, even if it is before your due date. Follow these instructions at home: Medicines  Follow your health care provider's instructions regarding medicine use. Specific medicines may be either safe or unsafe to take during pregnancy.  Take a prenatal vitamin that contains at least 600 micrograms  (mcg) of folic acid.  If you develop constipation, try taking a stool softener if your health care provider approves. Eating and drinking   Eat a balanced diet that includes fresh fruits and vegetables, whole grains, good sources of protein such as meat, eggs, or tofu, and low-fat dairy. Your health care provider will help you determine the amount of weight gain that is right for you.  Avoid raw meat and uncooked cheese. These carry germs that can cause birth defects in the baby.  If you have low calcium intake from food, talk to your health care provider about whether you should take a daily calcium supplement.  Eat four or five small meals rather than three large meals a day.  Limit foods that are high in fat and processed sugars, such as fried and sweet foods.  To prevent constipation: ? Drink enough fluid to keep your urine clear or pale yellow. ? Eat foods that are high in fiber, such as fresh fruits and vegetables, whole grains, and beans. Activity    Exercise only as directed by your health care provider. Most women can continue their usual exercise routine during pregnancy. Try to exercise for 30 minutes at least 5 days a week. Stop exercising if you experience uterine contractions.  Avoid heavy lifting.  Do not exercise in extreme heat or humidity, or at high altitudes.  Wear low-heel, comfortable shoes.  Practice good posture.  You may continue to have sex unless your health care provider tells you otherwise. Relieving pain and discomfort  Take frequent breaks and rest with your legs elevated if you have leg cramps or low back pain.  Take warm sitz baths to soothe any pain or discomfort caused by hemorrhoids. Use hemorrhoid cream if your health care provider approves.  Wear a good support bra to prevent discomfort from breast tenderness.  If you develop varicose veins: ? Wear support pantyhose or compression stockings as told by your healthcare provider. ? Elevate  your feet for 15 minutes, 3-4 times a day. Prenatal care  Write down your questions. Take them to your prenatal visits.  Keep all your prenatal visits as told by your health care provider. This is important. Safety  Wear your seat belt at all times when driving.  Make a list of emergency phone numbers, including numbers for family, friends, the hospital, and police and fire departments. General instructions  Avoid cat litter boxes and soil used by cats. These carry germs that can cause birth defects in the baby. If you have a cat, ask someone to clean the litter box for you.  Do not travel far distances unless it is absolutely necessary and only with the approval of your health care provider.  Do not use hot tubs, steam rooms, or saunas.  Do not drink alcohol.  Do not use any products that contain nicotine or tobacco, such as cigarettes and e-cigarettes. If you need help quitting, ask your health care provider.  Do not use any medicinal herbs or unprescribed drugs. These chemicals affect the formation and growth of the baby.  Do not douche or use tampons or scented sanitary pads.  Do not cross your legs for long periods of time.  To prepare for the arrival of your baby: ? Take prenatal classes to understand, practice, and ask questions about labor and delivery. ? Make a trial run to the hospital. ? Visit the hospital and tour the maternity area. ? Arrange for maternity or paternity leave through employers. ? Arrange for family and friends to take care of pets while you are in the hospital. ? Purchase a rear-facing car seat and make sure you know how to install it in your car. ? Pack your hospital bag. ? Prepare the baby's nursery. Make sure to remove all pillows and stuffed animals from the baby's crib to prevent suffocation.  Visit your dentist if you have not gone during your pregnancy. Use a soft toothbrush to brush your teeth and be gentle when you floss. Contact a health  care provider if:  You are unsure if you are in labor or if your water has broken.  You become dizzy.  You have mild pelvic cramps, pelvic pressure, or nagging pain in your abdominal area.  You have lower back pain.  You have persistent nausea, vomiting, or diarrhea.  You have an unusual or bad smelling vaginal discharge.  You have pain when you urinate. Get help right away if:  Your water breaks before 37 weeks.  You have regular contractions less than 5 minutes apart before   37 weeks.  You have a fever.  You are leaking fluid from your vagina.  You have spotting or bleeding from your vagina.  You have severe abdominal pain or cramping.  You have rapid weight loss or weight gain.  You have shortness of breath with chest pain.  You notice sudden or extreme swelling of your face, hands, ankles, feet, or legs.  Your baby makes fewer than 10 movements in 2 hours.  You have severe headaches that do not go away when you take medicine.  You have vision changes. Summary  The third trimester is from week 28 through week 40, months 7 through 9. The third trimester is a time when the unborn baby (fetus) is growing rapidly.  During the third trimester, your discomfort may increase as you and your baby continue to gain weight. You may have abdominal, leg, and back pain, sleeping problems, and an increased need to urinate.  During the third trimester your breasts will keep growing and they will continue to become tender. A yellow fluid (colostrum) may leak from your breasts. This is the first milk you are producing for your baby.  False labor is a condition in which you feel small, irregular tightenings of the muscles in the womb (contractions) that eventually go away. These are called Braxton Hicks contractions. Contractions may last for hours, days, or even weeks before true labor sets in.  Signs of labor can include: abdominal cramps; regular contractions that start at 10  minutes apart and become stronger and more frequent with time; watery or bloody mucus discharge that comes from the vagina; increased pelvic pressure and dull back pain; and leaking of amniotic fluid. This information is not intended to replace advice given to you by your health care provider. Make sure you discuss any questions you have with your health care provider. Document Revised: 07/02/2018 Document Reviewed: 04/16/2016 Elsevier Patient Education  2020 [employer]  

## 2020-03-23 NOTE — Addendum Note (Signed)
Addended by: Cornelius Moras D on: 03/23/2020 04:10 PM   Modules accepted: Orders

## 2020-03-23 NOTE — Progress Notes (Signed)
  Subjective  Fetal Movement? yes Contractions? no Leaking Fluid? no Vaginal Bleeding? no  Objective  BP 120/80   Wt (!) 314 lb (142.4 kg)   LMP 08/07/2019   BMI (P) 52.25 kg/m  General: NAD Pumonary: no increased work of breathing Abdomen: gravid, non-tender Extremities: no edema Psychiatric: mood appropriate, affect full  Assessment  24 y.o. [id number] at [redacted]w[redacted]d by  05/22/2020, by Ultrasound presenting for routine prenatal visit  Plan   Problem List Items Addressed This Visit    Supervision of high risk pregnancy    PNV, FMC    TDaP today   Obesity affecting pregnancy   Relevant Orders   US OB Follow Up APT 34 weeks ASA daily   [redacted] weeks gestation of pregnancy          Plans PP BTL       Papers signed today       Advised it would be 4-6 weeks after delivery as interval (based on obesity risk factors for immediate PP Tubal)      pregnancy3# Problems (from 08/07/19 to present)    Problem Noted Resolved   Anemia in pregnancy, third trimester     Obesity affecting pregnancy     Overview Addendum 12/31/2019  2:36 PM by Natale Milch, MD    Weight gain recommendation BMI> 30:  11-20 lbs  [x ] Screen of OSA- positive screen, referred for testing  [x]  Early diabetes screening for BMI>30  [x ] Consultation with Anesthesia- done/approved, w request for f/u anes appointment 34 weeks  Antenatal Testing BMI 40 or > Weekly at 34 weeks      Previous Version   Supervision of high risk pregnancy, antepartum     Overview Addendum 03/23/2020  3:55 PM by 03/25/2020, MD     Nursing Staff Provider  Office Location  [facility] Dating   6 wk Nadara Mustard  Language  English Anatomy US   incomplete  Flu Vaccine  declined Genetic Screen   normal XX   TDaP vaccine    Hgb A1C or  GTT Early : hgba1c 5.2 Third trimester : 118  Rhogam   not needed   LAB RESULTS   Feeding Plan  Blood Type B/Positive/-- (08/13 0839)   Contraception BTL, papers[x]  Antibody Negative (08/13 0839)   Circumcision  Rubella 10.60 (08/13 0839)  Pediatrician   RPR Non Reactive (12/17 1145)   Support Person  Dustin HBsAg Negative (08/13 0839)   Prenatal Classes  HIV Non Reactive (12/17 1145)    Varicella Imm  BTL Consent  GBS  (For PCN allergy, check sensitivities)        VBAC Consent n/a Pap  2019 NIL  Covid vaccine  vaccinated Hgb Electro      CF      SMA  carrier     Varicella immune          2020, MD, Annamarie Major Ob/Gyn, [facility] 03/23/2020  4:03 PM

## 2020-03-25 NOTE — L&D Delivery Note (Signed)
Date of delivery: 05/15/2020 Estimated Date of Delivery: 05/22/20 Patient's last menstrual period was 08/07/2019. EGA: [redacted]w[redacted]d  Delivery Note At 4:16 PM a viable female was delivered via Vaginal, Spontaneous (Presentation: Right Occiput Anterior).   APGAR: 8, 9    Weight: 4640 g, 10 pounds 4 ounces Placenta status: Spontaneous, Intact.  Cord: 3 vessels with the following complications: None.  Cord pH: NA  Called to see patient.  Mom pushed to deliver a viable female infant.  The head followed by shoulders, which were tight and delivered without additional maneuvers, and the rest of the body.  No nuchal cord noted.  Baby to mom's chest.  Cord clamped and cut after 3 min delay.  No cord blood obtained.  Placenta delivered spontaneously, intact, with a 3-vessel cord.  Second degree perineal laceration repaired with 3-0 Vicryl in standard fashion. Also noted peri-urethral scrapes- not repaired.  All counts correct.  Hemostasis obtained with IV pitocin and fundal massage.    Anesthesia: Epidural Episiotomy: None Lacerations: 2nd degree Suture Repair: 3.0 vicryl Est. Blood Loss (mL): 350  Mom to postpartum.  Baby to Couplet care / Skin to Skin.  [name], CNM 05/15/2020, 5:26 PM

## 2020-04-06 ENCOUNTER — Observation Stay: Payer: Managed Care, Other (non HMO)

## 2020-04-06 ENCOUNTER — Observation Stay
Admission: EM | Admit: 2020-04-06 | Discharge: 2020-04-06 | Disposition: A | Discharge status: Home / Self Care | Payer: Managed Care, Other (non HMO) | Attending: Obstetrics and Gynecology | Admitting: Obstetrics and Gynecology

## 2020-04-06 ENCOUNTER — Encounter: Payer: Self-pay | Admitting: Obstetrics & Gynecology

## 2020-04-06 ENCOUNTER — Other Ambulatory Visit: Payer: Self-pay

## 2020-04-06 DIAGNOSIS — Z3A33 33 weeks gestation of pregnancy: Secondary | ICD-10-CM | POA: Diagnosis not present

## 2020-04-06 DIAGNOSIS — R197 Diarrhea, unspecified: Secondary | ICD-10-CM

## 2020-04-06 DIAGNOSIS — O26899 Other specified pregnancy related conditions, unspecified trimester: Secondary | ICD-10-CM | POA: Diagnosis present

## 2020-04-06 DIAGNOSIS — O212 Late vomiting of pregnancy: Secondary | ICD-10-CM

## 2020-04-06 DIAGNOSIS — O26893 Other specified pregnancy related conditions, third trimester: Secondary | ICD-10-CM | POA: Diagnosis present

## 2020-04-06 DIAGNOSIS — Z20822 Contact with and (suspected) exposure to covid-19: Secondary | ICD-10-CM | POA: Diagnosis not present

## 2020-04-06 DIAGNOSIS — Z87891 Personal history of nicotine dependence: Secondary | ICD-10-CM | POA: Diagnosis not present

## 2020-04-06 DIAGNOSIS — R1011 Right upper quadrant pain: Secondary | ICD-10-CM

## 2020-04-06 DIAGNOSIS — O219 Vomiting of pregnancy, unspecified: Secondary | ICD-10-CM | POA: Insufficient documentation

## 2020-04-06 DIAGNOSIS — R109 Unspecified abdominal pain: Secondary | ICD-10-CM | POA: Diagnosis present

## 2020-04-06 DIAGNOSIS — O99013 Anemia complicating pregnancy, third trimester: Secondary | ICD-10-CM

## 2020-04-06 DIAGNOSIS — O99213 Obesity complicating pregnancy, third trimester: Secondary | ICD-10-CM

## 2020-04-06 DIAGNOSIS — O0990 Supervision of high risk pregnancy, unspecified, unspecified trimester: Secondary | ICD-10-CM

## 2020-04-06 LAB — URINALYSIS, ROUTINE W REFLEX MICROSCOPIC
Bilirubin Urine: NEGATIVE
Glucose, UA: NEGATIVE mg/dL
Ketones, ur: 80 mg/dL — AB
Leukocytes,Ua: NEGATIVE
Nitrite: NEGATIVE
Protein, ur: NEGATIVE mg/dL
Specific Gravity, Urine: 1.016 (ref 1.005–1.030)
pH: 6 (ref 5.0–8.0)

## 2020-04-06 LAB — COMPREHENSIVE METABOLIC PANEL
ALT: 17 U/L (ref 0–44)
AST: 19 U/L (ref 15–41)
Albumin: 2.9 g/dL — ABNORMAL LOW (ref 3.5–5.0)
Alkaline Phosphatase: 92 U/L (ref 38–126)
Anion gap: 12 (ref 5–15)
BUN: 5 mg/dL — ABNORMAL LOW (ref 6–20)
CO2: 19 mmol/L — ABNORMAL LOW (ref 22–32)
Calcium: 8.6 mg/dL — ABNORMAL LOW (ref 8.9–10.3)
Chloride: 102 mmol/L (ref 98–111)
Creatinine, Ser: 0.5 mg/dL (ref 0.44–1.00)
GFR, Estimated: >60 mL/min (ref >60)
Glucose, Bld: 108 mg/dL — ABNORMAL HIGH (ref 70–99)
Potassium: 3.8 mmol/L (ref 3.5–5.1)
Sodium: 133 mmol/L — ABNORMAL LOW (ref 135–145)
Total Bilirubin: 0.5 mg/dL (ref 0.3–1.2)
Total Protein: 7 g/dL (ref 6.5–8.1)

## 2020-04-06 LAB — RESP PANEL BY RT-PCR (FLU A&B, COVID) ARPGX2
Influenza A by PCR: NEGATIVE
Influenza B by PCR: NEGATIVE
SARS Coronavirus 2 by RT PCR: NEGATIVE

## 2020-04-06 LAB — LIPASE, BLOOD: Lipase: 33 U/L (ref 11–51)

## 2020-04-06 LAB — CBC
HCT: 32.6 % — ABNORMAL LOW (ref 36.0–46.0)
Hemoglobin: 10.3 g/dL — ABNORMAL LOW (ref 12.0–15.0)
MCH: 25.3 pg — ABNORMAL LOW (ref 26.0–34.0)
MCHC: 31.6 g/dL (ref 30.0–36.0)
MCV: 80.1 fL (ref 80.0–100.0)
Platelets: 359 10*3/uL (ref 150–400)
RBC: 4.07 MIL/uL (ref 3.87–5.11)
RDW: 13.3 % (ref 11.5–15.5)
WBC: 14.2 10*3/uL — ABNORMAL HIGH (ref 4.0–10.5)
nRBC: 0 % (ref 0.0–0.2)

## 2020-04-06 LAB — AMYLASE: Amylase: 30 U/L (ref 28–100)

## 2020-04-06 LAB — PROTEIN / CREATININE RATIO, URINE
Creatinine, Urine: 106 mg/dL
Protein Creatinine Ratio: 0.14 mg/mg{Cre} (ref 0.00–0.15)
Total Protein, Urine: 15 mg/dL

## 2020-04-06 MED ORDER — LOPERAMIDE HCL 2 MG PO CAPS
4.0000 mg | ORAL_CAPSULE | ORAL | Status: DC | PRN
  Administered 2020-04-06: 4 mg via ORAL
  Filled 2020-04-06 (×2): qty 2

## 2020-04-06 MED ORDER — ONDANSETRON 4 MG PO TBDP
4.0000 mg | ORAL_TABLET | Freq: Once | ORAL | Status: AC
  Administered 2020-04-06: 4 mg via ORAL
  Filled 2020-04-06: qty 1

## 2020-04-06 NOTE — Progress Notes (Signed)
K.Veal, CNM placed order for ultrasound, d/c EFM and pt off the unit for ultrasound.

## 2020-04-06 NOTE — Progress Notes (Signed)
Pt discharged home per K.Veal,CNM order.  Pt stable and ambulatory. An After Visit Summary was printed and given to the patient. Discharge education completed with patient/family including follow up instructions, medication list, d/c activities limitations if indicated, with other d/c instructions as indicated by CNM . Pt received labor and bleeding precautions. Pt has follow-up appointment scheduled for tomorrow at [facility] and pt encouraged to keep that appointment. Patient able to verbalize understanding, all questions fully answered. Patient instructed to return to ED, call 911, or call MD for any changes in condition. Pt discharged home via personal vehicle with support person.

## 2020-04-06 NOTE — Progress Notes (Signed)
Pt arrived to unit after ultrasound.

## 2020-04-06 NOTE — H&P (Signed)
Brittney Gill is a 25 y.o. female presenting for evaluation of upper abdominal pain, nausea and diarrhea that commenced earlier this morning at 0200.  She woke up with the pain, and has vomited twice at home before heading over to the hospital.  She has also had some diarrhea. Her baby is moving well, and she denies painful contractions, denies vaginal bleeding or LOF.  She has not taken any OTC medication to address her concerns. OB History    Gravida  3   Para  2   Term  2   Preterm      AB      Living  2     SAB      IAB      Ectopic      Multiple  0   Live Births  2          Past Medical History:  Diagnosis Date  . Anxiety   . Depression   . Pregnancy occurring while using intrauterine contraceptive device (IUD) 02/02/2018   Past Surgical History:  Procedure Laterality Date  . FRACTURE SURGERY     Left  . WISDOM TOOTH EXTRACTION     Family History: family history is not on file. Social History:  reports that she quit smoking about 2 years ago. Her smoking use included cigarettes. She started smoking about 4 years ago. She smoked 0.50 packs per day. She has never used smokeless tobacco. She reports that she does not drink alcohol and does not use drugs.     Maternal Diabetes: No Genetic Screening: Normal Maternal Ultrasounds/Referrals: Normal Fetal Ultrasounds or other Referrals:  None Maternal Substance Abuse:  No Significant Maternal Medications:  None Significant Maternal Lab Results:  None Other Comments:  None  Review of Systems  Constitutional: Positive for appetite change.  HENT: Negative.   Eyes: Negative.   Respiratory: Negative.   Cardiovascular: Negative.   Gastrointestinal: Positive for diarrhea and nausea.  Endocrine: Negative.   Genitourinary: Negative.   Musculoskeletal: Negative.   Allergic/Immunologic: Negative.   Neurological: Negative.   Hematological: Negative.   Psychiatric/Behavioral: Negative.    Maternal Medical  History:  Reason for admission: Nausea.       Height 5\' 5"  (1.651 m), weight (!) 142 kg, last menstrual period 08/07/2019, unknown if currently breastfeeding. Maternal Exam:  Introitus: Normal vulva.   Physical Exam Constitutional:      Appearance: Normal appearance. She is obese.  HENT:     Head: Normocephalic and atraumatic.     Nose: Nose normal.  Eyes:     Extraocular Movements: Extraocular movements intact.     Pupils: Pupils are equal, round, and reactive to light.  Cardiovascular:     Rate and Rhythm: Normal rate and regular rhythm.     Pulses: Normal pulses.     Heart sounds: Normal heart sounds.  Pulmonary:     Effort: Pulmonary effort is normal.     Breath sounds: Normal breath sounds.  Abdominal:     General: Bowel sounds are normal.     Palpations: Abdomen is soft.  Genitourinary:    General: Normal vulva.     Comments: Gravie. Exam deferred Musculoskeletal:        General: Normal range of motion.     Cervical back: Normal range of motion and neck supple.  Skin:    General: Skin is warm and dry.  Neurological:     General: No focal deficit present.     Mental Status: She  is alert and oriented to person, place, and time.  Psychiatric:        Mood and Affect: Mood normal.        Behavior: Behavior normal.    NST performed: FHTs are reactive, with accels present, decels absent. Baseline 150. Rare contraction noted Reactive per standard of two accels in a 30 minute window, normal baseline and absent decels.   Prenatal labs: ABO, Rh: B/Positive/-- (08/13 0839) Antibody: Negative (08/13 0839) Rubella: 10.60 (08/13 0839) RPR: Non Reactive (12/17 1145)  HBsAg: Negative (08/13 0839)  HIV: Non Reactive (12/17 1145)  GBS:     Assessment/Plan: IUP 33 weeks 3 days gestation with nausea, vomiting and diarrhea. Labs ordered: CBC, CMP, Lipase, amylase, UA PO Immodium  ODT Zofran EFM  continuous for now. Will evaluate lab work and further develop the POC  with oncoming provider. Sips of clears only.  Mirna Mires 04/06/2020, 7:44 AM

## 2020-04-06 NOTE — Discharge Summary (Signed)
See final progress note. 

## 2020-04-06 NOTE — Final Progress Note (Signed)
Physician Final Progress Note  Patient ID: Brittney MesaKayla J Gill MRN: [id number] DOB/AGE: 26/08/1995 24 y.o.  Admit date: 04/06/2020 Admitting provider: Natale Milchhristanna R Schuman, MD Discharge date: 04/06/2020   Admission Diagnoses:     Diagnosis Date Noted  . Abdominal pain in pregnancy, third trimester 04/06/2020  . Nausea and vomiting during pregnancy 04/06/2020  . Diarrhea during pregnancy 04/06/2020  . Anemia in pregnancy, third trimester 03/11/2020    Discharge Diagnoses:  Active Problems:   Abdominal pain in pregnancy, third trimester   Nausea and vomiting during pregnancy   Diarrhea during pregnancy    History of Present Illness: The patient is a 25 y.o. female G3P2002 at 7039w3d who presented for evaluation of upper abdominal pain, nausea and diarrhea that started at 0200 this morning.  She woke up with the pain that radiated across her upper abdomen and has vomited twice at home before heading over to the hospital.  She has also had 2 episodes of diarrhea. Her baby is moving well, and she denies painful contractions, denies vaginal bleeding or LOF.   Past Medical History:  Diagnosis Date  . Anxiety   . Depression   . Pregnancy occurring while using intrauterine contraceptive device (IUD) 02/02/2018    Past Surgical History:  Procedure Laterality Date  . FRACTURE SURGERY     Left  . WISDOM TOOTH EXTRACTION      No current facility-administered medications on file prior to encounter.   Current Outpatient Medications on File Prior to Encounter  Medication Sig Dispense Refill  . ferrous sulfate (FERROUSUL) 325 (65 FE) MG tablet Take 1 tablet (325 mg total) by mouth daily with breakfast. 30 tablet 3  . Prenatal Vit-Fe Fumarate-FA (MULTIVITAMIN-PRENATAL) 27-0.8 MG TABS tablet Take 1 tablet by mouth daily at 12 noon.    . famotidine (PEPCID) 20 MG tablet Take 20 mg by mouth 2 (two) times daily. (Patient not taking: Reported on 04/06/2020)      Allergies  Allergen Reactions   . Other Itching    Vitamin E cream  . Diflucan [Fluconazole] Rash    Social History   Socioeconomic History  . Marital status: Married    Spouse name: Not on file  . Number of children: 2  . Years of education: Not on file  . Highest education level: High school graduate  Occupational History  . Not on file  Tobacco Use  . Smoking status: Former Smoker    Packs/day: 0.50    Types: Cigarettes    Start date: 04/01/2016    Quit date: 12/23/2017    Years since quitting: 2.2  . Smokeless tobacco: Never Used  . Tobacco comment: Had quit in 2017, started back in 2018 but quit when she found out she was pregnant.  Vaping Use  . Vaping Use: Never used  Substance and Sexual Activity  . Alcohol use: No    Comment: social-not often  . Drug use: No  . Sexual activity: Yes    Birth control/protection: Surgical    Comment: vasectomy  Other Topics Concern  . Not on file  Social History Narrative  . Not on file   Social Determinants of Health   Financial Resource Strain: Not on file  Food Insecurity: Not on file  Transportation Needs: Not on file  Physical Activity: Not on file  Stress: Not on file  Social Connections: Not on file  Intimate Partner Violence: Not on file    History reviewed. No pertinent family history.   Review of Systems  Constitutional: Negative for chills, fever and malaise/fatigue.  HENT: Negative.   Eyes: Negative.   Respiratory: Negative.   Cardiovascular: Negative.   Gastrointestinal: Positive for abdominal pain, diarrhea, nausea and vomiting.       Upper abd pain that radiates across epigastric area  Genitourinary: Negative.   Musculoskeletal: Negative.   Skin: Negative.   Neurological: Negative.   Endo/Heme/Allergies: Negative.   Psychiatric/Behavioral: Negative.      Physical Exam: BP 130/68 (BP Location: Right Arm)   Pulse (!) 101   Temp 98.4 F (36.9 C) (Oral)   Resp 18   Ht 5\' 5"  (1.651 m)   Wt (!) 142 kg   LMP 08/07/2019   BMI  52.09 kg/m   Physical Exam Constitutional:      General: She is not in acute distress.    Appearance: Normal appearance. She is obese. She is not ill-appearing.  Genitourinary:     Genitourinary Comments: Deferred  Cardiovascular:     Rate and Rhythm: Normal rate.  Pulmonary:     Effort: Pulmonary effort is normal.  Abdominal:     Palpations: Abdomen is soft.     Tenderness: There is abdominal tenderness. There is no guarding or rebound.     Comments: Tenderness noted with palpation to RUQ  Musculoskeletal:        General: Normal range of motion.     Cervical back: Normal range of motion.  Neurological:     Mental Status: She is alert and oriented to person, place, and time.  Skin:    General: Skin is warm and dry.  Psychiatric:        Mood and Affect: Mood normal.        Behavior: Behavior normal.  Vitals reviewed.     Consults: None  Significant Findings/ Diagnostic Studies: labs:  Results for Brittney, Gill (MRN Brittney Gill) as of 04/06/2020 10:20  Ref. Range 04/06/2020 07:44  Sodium Latest Ref Range: 135 - 145 mmol/L 133 (L)  Potassium Latest Ref Range: 3.5 - 5.1 mmol/L 3.8  Chloride Latest Ref Range: 98 - 111 mmol/L 102  CO2 Latest Ref Range: 22 - 32 mmol/L 19 (L)  Glucose Latest Ref Range: 70 - 99 mg/dL 04/08/2020 (H)  BUN Latest Ref Range: 6 - 20 mg/dL 5 (L)  Creatinine Latest Ref Range: 0.44 - 1.00 mg/dL 154  Calcium Latest Ref Range: 8.9 - 10.3 mg/dL 8.6 (L)  Anion gap Latest Ref Range: 5 - 15  12  Alkaline Phosphatase Latest Ref Range: 38 - 126 U/L 92  Albumin Latest Ref Range: 3.5 - 5.0 g/dL 2.9 (L)  Amylase, Serum Latest Ref Range: 28 - 100 U/L 30  Lipase Latest Ref Range: 11 - 51 U/L 33  AST Latest Ref Range: 15 - 41 U/L 19  ALT Latest Ref Range: 0 - 44 U/L 17  Total Protein Latest Ref Range: 6.5 - 8.1 g/dL 7.0  Total Bilirubin Latest Ref Range: 0.3 - 1.2 mg/dL 0.5  GFR, Estimated Latest Ref Range: >60 mL/min >60  WBC Latest Ref Range: 4.0 - 10.5 K/uL 14.2  (H)  RBC Latest Ref Range: 3.87 - 5.11 MIL/uL 4.07  Hemoglobin Latest Ref Range: 12.0 - 15.0 g/dL 0.08 (L)  HCT Latest Ref Range: 36.0 - 46.0 % 32.6 (L)  MCV Latest Ref Range: 80.0 - 100.0 fL 80.1  MCH Latest Ref Range: 26.0 - 34.0 pg 25.3 (L)  MCHC Latest Ref Range: 30.0 - 36.0 g/dL 67.6  RDW Latest Ref Range: 11.5 -  15.5 % 13.3  Platelets Latest Ref Range: 150 - 400 K/uL 359  nRBC Latest Ref Range: 0.0 - 0.2 % 0.0   Results for Brittney, Gill (MRN [id number]) as of 04/06/2020 10:20  Ref. Range 04/06/2020 08:29  Influenza A By PCR Latest Ref Range: NEGATIVE  NEGATIVE  Influenza B By PCR Latest Ref Range: NEGATIVE  NEGATIVE  SARS Coronavirus 2 by RT PCR Latest Ref Range: NEGATIVE  NEGATIVE   Results for Brittney, Gill (MRN [id number]) as of 04/06/2020 10:20  Ref. Range 04/06/2020 07:31  Appearance Latest Ref Range: CLEAR  CLEAR (A)  Bilirubin Urine Latest Ref Range: NEGATIVE  NEGATIVE  Color, Urine Latest Ref Range: YELLOW  YELLOW (A)  Glucose, UA Latest Ref Range: NEGATIVE mg/dL NEGATIVE  Hgb urine dipstick Latest Ref Range: NEGATIVE  SMALL (A)  Ketones, ur Latest Ref Range: NEGATIVE mg/dL 80 (A)  Leukocytes,Ua Latest Ref Range: NEGATIVE  NEGATIVE  Nitrite Latest Ref Range: NEGATIVE  NEGATIVE  pH Latest Ref Range: 5.0 - 8.0  6.0  Protein Latest Ref Range: NEGATIVE mg/dL NEGATIVE  Specific Gravity, Urine Latest Ref Range: 1.005 - 1.030  1.016  Bacteria, UA Latest Ref Range: NONE SEEN  RARE (A)  Mucus Unknown PRESENT  RBC / HPF Latest Ref Range: 0 - 5 RBC/hpf 0-5  Squamous Epithelial / LPF Latest Ref Range: 0 - 5  0-5  WBC, UA Latest Ref Range: 0 - 5 WBC/hpf 0-5  Total Protein, Urine Latest Units: mg/dL 15  Protein Creatinine Ratio Latest Ref Range: 0.00 - 0.15 mg/mgCre 0.14  Creatinine, Urine Latest Units: mg/dL 008   Radiology:   EXAM: ULTRASOUND ABDOMEN LIMITED RIGHT UPPER QUADRANT COMPARISON:  None. FINDINGS: Gallbladder: No gallstones or wall thickening  visualized. No sonographic Murphy sign noted by sonographer. Common bile duct: Diameter: 4.5 mm Liver: Increased echogenicity consistent fatty infiltration or hepatocellular disease. No focal hepatic abnormality identified. Portal vein is patent on color Doppler imaging with normal direction of blood flow towards the liver. Other: None.  IMPRESSION: 1. No gallstones or biliary distention. 2. Increased hepatic echogenicity consistent fatty infiltration or hepatocellular disease.  Procedures: NST NONSTRESS TEST INTERPRETATION  INDICATIONS: rule out uterine contractions FHR baseline: 150 RESULTS:  A NST procedure was performed with FHR monitoring and a normal baseline established, appropriate time of 20-40 minutes of evaluation, and accels >2 seen w 15x15 characteristics.  Results show a REACTIVE NST.    Hospital Course: The patient was admitted to Labor and Delivery Triage for observation. Patient received tx for sx of N/V/D. Upon reassessment patient stated an improvement in these sx. Patient continues to report intermittent upper abdominal pain, primarily associated with movement. Discussed probable gastroenteritis. RUQ [place] r/o concern for biliary colic. Reactive NST. Intermittent ctx noted on toco, patient describes them as "Deberah Pelton." Plan made to discharge patient home. RTC if worsen of symptoms, inability to tolerate oral intake, or S&S of PTL. Patient stated understanding.   Discharge Condition: good  Disposition: Discharge disposition: 01-Home or Self Care       Diet: Advance as tolerated  Discharge Activity: Activity as tolerated   Allergies as of 04/06/2020      Reactions   Other Itching   Vitamin E cream   Diflucan [fluconazole] Rash      Medication List    TAKE these medications   famotidine 20 MG tablet Commonly known as: PEPCID Take 20 mg by mouth 2 (two) times daily.   ferrous sulfate 325 (65 FE) MG tablet  Commonly known as: FerrouSul Take 1  tablet (325 mg total) by mouth daily with breakfast.   multivitamin-prenatal 27-0.8 MG Tabs tablet Take 1 tablet by mouth daily at 12 noon.        Total time spent taking care of this patient: 30 minutes  Signed:  Zipporah Plants, CNM 04/06/2020, 10:14 AM

## 2020-04-06 NOTE — OB Triage Note (Signed)
Pt Brittney Gill 24 y.o. presents to the ED complaining of abdominal pain and N/V diarrhea . Pt is a G3P2002 at [redacted]w[redacted]d . Pt denies signs and symptons consistent with rupture of membranes or active vaginal bleeding. Pt denies contractions and states positive fetal movement. Pt reports 7/10 upper abdominal pain since 0200 today and N/V and 2 episodes of diarrhea that started at 0500. Pt has not taken anything for pain or N/V. External FM and TOCO applied to non-tender abdomen and assessing. Initial FHR 155 . Vital signs obtained and within normal limits. Provider notified of pt.

## 2020-04-06 NOTE — Discharge Instructions (Signed)
Follow-up appointment tomorrow at [facility]- keep appointment

## 2020-04-07 ENCOUNTER — Ambulatory Visit (INDEPENDENT_AMBULATORY_CARE_PROVIDER_SITE_OTHER): Payer: Managed Care, Other (non HMO) | Admitting: Obstetrics & Gynecology

## 2020-04-07 ENCOUNTER — Encounter: Payer: Self-pay | Admitting: Obstetrics & Gynecology

## 2020-04-07 VITALS — BP 110/64 | Wt 311.0 lb

## 2020-04-07 DIAGNOSIS — O0993 Supervision of high risk pregnancy, unspecified, third trimester: Secondary | ICD-10-CM

## 2020-04-07 DIAGNOSIS — O99213 Obesity complicating pregnancy, third trimester: Secondary | ICD-10-CM

## 2020-04-07 DIAGNOSIS — Z3A33 33 weeks gestation of pregnancy: Secondary | ICD-10-CM

## 2020-04-07 LAB — POCT URINALYSIS DIPSTICK OB
Glucose, UA: NEGATIVE
POC,PROTEIN,UA: NEGATIVE

## 2020-04-07 NOTE — Patient Instructions (Signed)
Thank you for choosing [facility]. As part of our ongoing efforts to improve patient experience, we would appreciate your feedback. Please fill out the short survey that you will receive by mail or MyChart. Your opinion is important to [place]! -Dr Mauro Kaufmann Test A nonstress test is a procedure that is done during pregnancy in order to check the baby's heartbeat. The procedure can help to show if the baby (fetus) is healthy. It is commonly done when:  The baby is past his or her due date.  The pregnancy is high risk.  The baby is moving less than normal.  The mother has lost a pregnancy in the past.  The health care provider suspects a problem with the baby's growth.  There is too much or too little amniotic fluid. The procedure is often done in the third trimester of pregnancy to find out if an early delivery is needed and whether such a delivery is safe. During a nonstress test, the baby's heartbeat is monitored when the baby is resting and when the baby is moving. If the baby is healthy, the heart rate will increase when he or she moves or kicks and will return to normal when he or she rests. Tell a health care provider about:  Any allergies you have.  Any medical conditions you have.  All medicines you are taking, including vitamins, herbs, eye drops, creams, and over-the-counter medicines.  Any surgeries you have had.  Any past pregnancies you have had. What are the risks? There are no risks to you or your baby from a nonstress test. This procedure should not be painful or uncomfortable. What happens before the procedure?  Eat a meal right before the test or as directed by your health care provider. Food may help encourage the baby to move.  Use the restroom right before the test. What happens during the procedure?  Two monitors will be placed on your abdomen. One will record the baby's heart rate and the other will record the contractions of your uterus.  You may  be asked to lie down on your side or to sit upright.  You may be given a button to press when you feel your baby move.  Your health care provider will listen to your baby's heartbeat and record it. He or she may also watch your baby's heartbeat on a screen.  If the baby seems to be sleeping, you may be asked to drink some juice or soda, eat a snack, or change positions. The procedure may vary among health care providers and hospitals.   What can I expect after procedure?  Your health care provider will discuss the test results with you and make recommendations for the future. Depending on the results, your health care provider may order additional tests or another course of action.  If your health care provider gave you any diet or activity instructions, make sure to follow them.  Keep all follow-up visits. This is important. Summary  A nonstress test is a procedure that is done during pregnancy in order to check the baby's heartbeat. The procedure can help show if the baby is healthy.  The procedure is often done in the third trimester of pregnancy to find out if an early delivery is needed and whether such a delivery is safe.  During a nonstress test, the baby's heartbeat is monitored when the baby is resting and when the baby is moving. If the baby is healthy, the heart rate will increase when he  or she moves or kicks and will return to normal when he or she rests.  Your health care provider will discuss the test results with you and make recommendations for the future. This information is not intended to replace advice given to you by your health care provider. Make sure you discuss any questions you have with your health care provider. Document Revised: 12/20/2019 Document Reviewed: 12/20/2019 Elsevier Patient Education  2021 [employer].

## 2020-04-07 NOTE — Progress Notes (Signed)
  Subjective  Fetal Movement? yes Contractions? no Leaking Fluid? no Vaginal Bleeding? no Seen for n/v/d (gastroenteritis) yesterday, slowly improving    No diarrhea since took imodium    Mild nausea; able to drink, eat Objective  BP 110/64   Wt (!) 311 lb (141.1 kg)   LMP 08/07/2019   BMI 51.75 kg/m  General: NAD Pumonary: no increased work of breathing Abdomen: gravid, non-tender Extremities: no edema Psychiatric: mood appropriate, affect full  Assessment  25 y.o. [id number] at [redacted]w[redacted]d by  05/22/2020, by Ultrasound presenting for routine prenatal visit  Plan   Problem List Items Addressed This Visit         Supervision of high risk pregnancy, antepartum    PNV, FMC    PP interval BTL planned   Obesity affecting pregnancy    APT weekly, starting nv    ASA daily   Relevant Orders   US OB Limited   [redacted] weeks gestation of pregnancy      Relevant Orders   POC Urinalysis Dipstick OB (Completed)        Nursing Staff Provider  Office Location  [facility] Dating   6 wk [place]  Language  English Anatomy US   incomplete  Flu Vaccine  declined Genetic Screen   normal XX   TDaP vaccine    Hgb A1C or  GTT Early : hgba1c 5.2 Third trimester : 118  Rhogam   not needed   LAB RESULTS   Feeding Plan  Blood Type B/Positive/-- (08/13 0839)   Contraception BTL, papers[x]  Antibody Negative (08/13 0839)  Circumcision  Rubella 10.60 (08/13 0839)  Pediatrician   RPR Non Reactive (12/17 1145)   Support Person  Dustin HBsAg Negative (08/13 0839)   Prenatal Classes  HIV Non Reactive (12/17 1145)    Varicella Imm  BTL Consent  GBS  (For PCN allergy, check sensitivities)        VBAC Consent n/a Pap  2019 NIL  Covid vaccine  vaccinated Hgb Electro      CF      SMA  carrier     Varicella immune     High risk Pregnancy Diagnoses Obesity in pregnancy           Annamarie Major, MD, [facility], [facility] 04/07/2020  4:38 PM

## 2020-04-14 ENCOUNTER — Ambulatory Visit (INDEPENDENT_AMBULATORY_CARE_PROVIDER_SITE_OTHER): Payer: Managed Care, Other (non HMO) | Admitting: Obstetrics and Gynecology

## 2020-04-14 ENCOUNTER — Other Ambulatory Visit: Payer: Self-pay

## 2020-04-14 ENCOUNTER — Encounter: Payer: Self-pay | Admitting: Obstetrics and Gynecology

## 2020-04-14 ENCOUNTER — Ambulatory Visit (INDEPENDENT_AMBULATORY_CARE_PROVIDER_SITE_OTHER): Payer: Managed Care, Other (non HMO)

## 2020-04-14 VITALS — BP 126/84 | Wt 317.0 lb

## 2020-04-14 DIAGNOSIS — O99213 Obesity complicating pregnancy, third trimester: Secondary | ICD-10-CM

## 2020-04-14 DIAGNOSIS — Z3A34 34 weeks gestation of pregnancy: Secondary | ICD-10-CM | POA: Diagnosis not present

## 2020-04-14 DIAGNOSIS — O0990 Supervision of high risk pregnancy, unspecified, unspecified trimester: Secondary | ICD-10-CM | POA: Diagnosis not present

## 2020-04-14 NOTE — Progress Notes (Signed)
Routine Prenatal Care Visit  Subjective  Brittney Gill is a 25 y.o. G3P2002 at [redacted]w[redacted]d being seen today for ongoing prenatal care.  She is currently monitored for the following issues for this high-risk pregnancy and has Anxiety; Morbid obesity (HCC); Depression; Vitamin D deficiency; Supervision of high risk pregnancy, antepartum; Depression with anxiety; Obesity affecting pregnancy; BMI 50.0-59.9, adult (HCC); Leukocytosis; Hyperglycemia; Abnormal finding on antenatal screen; Carrier of genetic disorder; Decreased fetal movement; Anemia in pregnancy, third trimester; Abdominal pain in pregnancy, third trimester; Nausea and vomiting during pregnancy; and Diarrhea during pregnancy on their problem list.  ----------------------------------------------------------------------------------- Patient reports no complaints.   Contractions: Not present. Vag. Bleeding: None.  Movement: Present. Leaking Fluid denies.  U/s: AFI 13 cm ----------------------------------------------------------------------------------- The following portions of the patient's history were reviewed and updated as appropriate: allergies, current medications, past family history, past medical history, past social history, past surgical history and problem list. Problem list updated.  Objective  Blood pressure 126/84, weight (!) 317 lb (143.8 kg), last menstrual period 08/07/2019, unknown if currently breastfeeding. Pregravid weight 303 lb (137.4 kg) Total Weight Gain 14 lb (6.35 kg) Urinalysis: Urine Protein    Urine Glucose    Fetal Status: Fetal Heart Rate (bpm): 150   Movement: Present  Presentation: Vertex  General:  Alert, oriented and cooperative. Patient is in no acute distress.  Skin: Skin is warm and dry. No rash noted.   Cardiovascular: Normal heart rate noted  Respiratory: Normal respiratory effort, no problems with respiration noted  Abdomen: Soft, gravid, appropriate for gestational age. Pain/Pressure: Absent      Pelvic:  Cervical exam deferred        Extremities: Normal range of motion.  Edema: None  Mental Status: Normal mood and affect. Normal behavior. Normal judgment and thought content.   NST: Baseline FHR: 150 beats/min Variability: moderate Accelerations: present Decelerations: absent Tocometry: not done  Interpretation:  INDICATIONS: obesity pregnancy RESULTS:  A NST procedure was performed with FHR monitoring and a normal baseline established, appropriate time of 20-40 minutes of evaluation, and accels >2 seen w 15x15 characteristics.  Results show a REACTIVE NST.    Imaging Results US OB Limited  Result Date: 04/14/2020 Patient Name: Brittney Gill DOB: December 06, 1995 MRN: [id number] ULTRASOUND REPORT Location: [facility] Date of Service: 04/14/2020 Indications:AFI Findings: Mason Jim intrauterine pregnancy is visualized with FHR at 147 BPM. Fetal presentation is Cephalic. Placenta: posterior fundal. Grade: 2 AFI: 13.1 cm Impression: 1. [redacted]w[redacted]d Viable Singleton Intrauterine pregnancy dated by previously established criteria. 2. AFI is 13.1 cm. Deanna Artis, RT The ultrasound images and findings were reviewed by me and I agree with the above report. Thomasene Mohair, MD, [facility], [facility] 04/14/2020 1:44 PM       Assessment   24 y.o. [id number] at [redacted]w[redacted]d by  05/22/2020, by Ultrasound presenting for routine prenatal visit  Plan   pregnancy3# Problems (from 08/07/19 to present)    Problem Noted Resolved   Nausea and vomiting during pregnancy 04/06/2020 by Zipporah Plants, CNM No   Diarrhea during pregnancy 04/06/2020 by Zipporah Plants, CNM No   Anemia in pregnancy, third trimester 03/11/2020 by Vena Austria, MD No   Obesity affecting pregnancy 02/10/2018 by Conard Novak, MD No   Overview Addendum 12/31/2019  2:36 PM by Natale Milch, MD    Weight gain recommendation BMI> 30:  11-20 lbs  [x ] Screen of OSA- positive screen, referred for  testing  [x]  Early diabetes screening for  BMI>30  [x ] Consultation with Anesthesia- patient has upcoming appointment  Antenatal Testing BMI 35-39.9 Weekly at 37 weeks BMI 40 or > Weekly at 34 weeks        Previous Version   Supervision of high risk pregnancy, antepartum 02/02/2018 by Conard Novak, MD No   Overview Addendum 04/03/2020  1:06 PM by Mirna Mires, CNM     Nursing Staff Provider  Office Location  [facility] Dating   6 wk [place]  Language  English Anatomy US   incomplete  Flu Vaccine  declined Genetic Screen   normal XX   TDaP vaccine    Hgb A1C or  GTT Early : hgba1c 5.2 Third trimester : 118  Rhogam   not needed   LAB RESULTS   Feeding Plan  Blood Type B/Positive/-- (08/13 0839)   Contraception BTL, papers[x]  Antibody Negative (08/13 0839)  Circumcision  Rubella 10.60 (08/13 0839)  Pediatrician   RPR Non Reactive (12/17 1145)   Support Person  Dustin HBsAg Negative (08/13 0839)   Prenatal Classes  HIV Non Reactive (12/17 1145)    Varicella Imm  BTL Consent  GBS  (For PCN allergy, check sensitivities)        VBAC Consent n/a Pap  2019 NIL  Covid vaccine  vaccinated Hgb Electro      CF      SMA  carrier     Varicella immune     High risk Pregnancy Diagnoses Obesity in pregnancy- resolved by 32 weeks        Previous Version       Preterm labor symptoms and general obstetric precautions including but not limited to vaginal bleeding, contractions, leaking of fluid and fetal movement were reviewed in detail with the patient. Please refer to After Visit Summary for other counseling recommendations.   - anesthesia consult ordered  Return in about 1 week (around 04/21/2020) for change u/s to growth/AFI and routine prenatal with NST.   Thomasene Mohair, MD, [facility], [facility] 04/14/2020 2:06 PM

## 2020-04-19 ENCOUNTER — Telehealth: Payer: Self-pay

## 2020-04-19 NOTE — Telephone Encounter (Signed)
FMLA/DISABILITY form for [employer] filled out, signature obtained and given to BS for processing. 

## 2020-04-21 ENCOUNTER — Encounter
Admission: RE | Admit: 2020-04-21 | Discharge: 2020-04-21 | Disposition: A | Discharge status: Home / Self Care | Payer: Managed Care, Other (non HMO) | Source: Ambulatory Visit | Attending: Anesthesiology | Admitting: Anesthesiology

## 2020-04-21 ENCOUNTER — Ambulatory Visit (INDEPENDENT_AMBULATORY_CARE_PROVIDER_SITE_OTHER): Payer: Managed Care, Other (non HMO)

## 2020-04-21 ENCOUNTER — Other Ambulatory Visit: Payer: Self-pay

## 2020-04-21 ENCOUNTER — Ambulatory Visit (INDEPENDENT_AMBULATORY_CARE_PROVIDER_SITE_OTHER): Payer: Managed Care, Other (non HMO) | Admitting: Obstetrics

## 2020-04-21 VITALS — BP 128/80 | Wt 318.0 lb

## 2020-04-21 DIAGNOSIS — Z3A36 36 weeks gestation of pregnancy: Secondary | ICD-10-CM

## 2020-04-21 DIAGNOSIS — Z3A38 38 weeks gestation of pregnancy: Secondary | ICD-10-CM | POA: Diagnosis not present

## 2020-04-21 DIAGNOSIS — O99213 Obesity complicating pregnancy, third trimester: Secondary | ICD-10-CM

## 2020-04-21 DIAGNOSIS — O0990 Supervision of high risk pregnancy, unspecified, unspecified trimester: Secondary | ICD-10-CM

## 2020-04-21 DIAGNOSIS — Z3A35 35 weeks gestation of pregnancy: Secondary | ICD-10-CM

## 2020-04-21 LAB — POCT URINALYSIS DIPSTICK OB
Glucose, UA: NEGATIVE
POC,PROTEIN,UA: NEGATIVE

## 2020-04-21 NOTE — Progress Notes (Signed)
Routine Prenatal Care Visit  Subjective  Brittney Gill is a 25 y.o. G3P2002 at [redacted]w[redacted]d being seen today for ongoing prenatal care.  She is currently monitored for the following issues for this high-risk pregnancy and has Anxiety; Morbid obesity (HCC); Depression; Vitamin D deficiency; Supervision of high risk pregnancy, antepartum; Depression with anxiety; Obesity affecting pregnancy; BMI 50.0-59.9, adult (HCC); Leukocytosis; Hyperglycemia; Abnormal finding on antenatal screen; Carrier of genetic disorder; Decreased fetal movement; Anemia in pregnancy, third trimester; Abdominal pain in pregnancy, third trimester; Nausea and vomiting during pregnancy; and Diarrhea during pregnancy on their problem list.  ----------------------------------------------------------------------------------- Patient reports no bleeding, no contractions, no cramping, no leaking, occasional contractions and she feels more pelvic pressure now that she is further along in the pregnancy.she is eager for her induction. She has had a growth sono today and per the sono, the baby is over 8 lbs..    .  .   Pincus Large Fluid denies.  ----------------------------------------------------------------------------------- The following portions of the patient's history were reviewed and updated as appropriate: allergies, current medications, past family history, past medical history, past social history, past surgical history and problem list. Problem list updated.  Objective  Blood pressure 128/80, weight (!) 318 lb (144.2 kg), last menstrual period 08/07/2019, unknown if currently breastfeeding. Pregravid weight 303 lb (137.4 kg) Total Weight Gain 15 lb (6.804 kg) Urinalysis: Urine Protein Negative  Urine Glucose Negative  Fetal Status:           General:  Alert, oriented and cooperative. Patient is in no acute distress.  Skin: Skin is warm and dry. No rash noted.   Cardiovascular: Normal heart rate noted  Respiratory: Normal  respiratory effort, no problems with respiration noted  Abdomen: Soft, gravid, appropriate for gestational age.       Pelvic:  Cervical exam deferred        Extremities: Normal range of motion.     Mental Status: Normal mood and affect. Normal behavior. Normal judgment and thought content.  NST toay is reactive with accels present , normal baseline and no decels. Moderate variability  Assessment   24 y.o. [id number] at [redacted]w[redacted]d by  05/22/2020, by Ultrasound presenting for routine prenatal visit  Plan   pregnancy3# Problems (from 08/07/19 to present)    Problem Noted Resolved   Nausea and vomiting during pregnancy 04/06/2020 by Zipporah Plants, CNM No   Diarrhea during pregnancy 04/06/2020 by Zipporah Plants, CNM No   Anemia in pregnancy, third trimester 03/11/2020 by Vena Austria, MD No   Obesity affecting pregnancy 02/10/2018 by Conard Novak, MD No   Overview Addendum 12/31/2019  2:36 PM by Natale Milch, MD    Weight gain recommendation BMI> 30:  11-20 lbs  [x ] Screen of OSA- positive screen, referred for testing  [x]  Early diabetes screening for BMI>30  [x ] Consultation with Anesthesia- patient has upcoming appointment  Antenatal Testing BMI 35-39.9 Weekly at 37 weeks BMI 40 or > Weekly at 34 weeks        Previous Version   Supervision of high risk pregnancy, antepartum 02/02/2018 by 13/01/2018, MD No   Overview Addendum 04/03/2020  1:06 PM by 06/01/2020, CNM     Nursing Staff Provider  Office Location  [facility] Dating   6 wk Mirna Mires  Language  English Anatomy US   incomplete  Flu Vaccine  declined Genetic Screen   normal XX   TDaP vaccine    Hgb A1C or  GTT Early :  hgba1c 5.2 Third trimester : 118  Rhogam   not needed   LAB RESULTS   Feeding Plan  Blood Type B/Positive/-- (08/13 0839)   Contraception BTL, papers[x]  Antibody Negative (08/13 0839)  Circumcision  Rubella 10.60 (08/13 0839)  Pediatrician   RPR Non Reactive (12/17 1145)   Support  Person  Dustin HBsAg Negative (08/13 9381)   Prenatal Classes  HIV Non Reactive (12/17 1145)    Varicella Imm  BTL Consent  GBS  (For PCN allergy, check sensitivities)        VBAC Consent n/a Pap  2019 NIL  Covid vaccine  vaccinated Hgb Electro      CF      SMA  carrier     Varicella immune     High risk Pregnancy Diagnoses Obesity in pregnancy- resolved by 32 weeks        Previous Version       Term labor symptoms and general obstetric precautions including but not limited to vaginal bleeding, contractions, leaking of fluid and fetal movement were reviewed in detail with the patient. Please refer to After Visit Summary for other counseling recommendations.  Reviewed her ultrasound which shows an AFI of 13.9 and EFW of 8lbs 6 oz; this is growth 90% and AC>97% Briefly discussed her IOL which can beset up once she is [redacted] weeks gestation.  Return in about 1 week (around 04/28/2020) for ROB and NST.  Mirna Mires, CNM  04/21/2020 4:11 PM

## 2020-04-21 NOTE — Consult Note (Signed)
[facility] Anesthesia Consultation  Brittney Gill ENI:[id number] DOB: 20-Feb-1996 DOA: 04/21/2020 PCP: Jamelle Haring, MD   Requesting physician: Dr Thomasene Mohair Date of consultation: 04/21/2020 Reason for consultation: Obesity during pregnancy  CHIEF COMPLAINT:  Obesity during pregnancy  HISTORY OF PRESENT ILLNESS: Brittney Gill  is a G3P2 female at [redacted]w[redacted]d 25 y.o. female with a known history of OSA (per patient she was told it's "pretty bad" but has not had CPAP instituted yet) and morbid obesity (BMI 53) presenting for anesthesia consult due to morbid obesity. Prior two deliveries were here and were vaginal, epidural both times without complications or issues with placement or function. Patient desiring epidural for this pregnancy. No hx of back surgery, no hx bleeding diatheses.   PAST MEDICAL HISTORY:   Past Medical History:  Diagnosis Date  . Anxiety   . Depression   . Pregnancy occurring while using intrauterine contraceptive device (IUD) 02/02/2018    PAST SURGICAL HISTORY:  Past Surgical History:  Procedure Laterality Date  . FRACTURE SURGERY     Left  . WISDOM TOOTH EXTRACTION      SOCIAL HISTORY:  Social History   Tobacco Use  . Smoking status: Former Smoker    Packs/day: 0.50    Types: Cigarettes    Start date: 04/01/2016    Quit date: 12/23/2017    Years since quitting: 2.3  . Smokeless tobacco: Never Used  . Tobacco comment: Had quit in 2017, started back in 2018 but quit when she found out she was pregnant.  Substance Use Topics  . Alcohol use: No    Comment: social-not often    FAMILY HISTORY: No family history on file.  DRUG ALLERGIES:  Allergies  Allergen Reactions  . Other Itching    Vitamin E cream  . Diflucan [Fluconazole] Rash    REVIEW OF SYSTEMS:   RESPIRATORY: No cough, shortness of breath, wheezing.  CARDIOVASCULAR: No chest pain, orthopnea, edema.  HEMATOLOGY: No anemia, easy  bruising or bleeding SKIN: No rash or lesion. NEUROLOGIC: No tingling, numbness, weakness.  PSYCHIATRY: No anxiety or depression.   MEDICATIONS AT HOME:  Prior to Admission medications   Medication Sig Start Date End Date Taking? Authorizing Provider  famotidine (PEPCID) 20 MG tablet Take 20 mg by mouth 2 (two) times daily.    [provider]  ferrous sulfate (FERROUSUL) 325 (65 FE) MG tablet Take 1 tablet (325 mg total) by mouth daily with breakfast. 03/11/20   Vena Austria, MD  Prenatal Vit-Fe Fumarate-FA (MULTIVITAMIN-PRENATAL) 27-0.8 MG TABS tablet Take 1 tablet by mouth daily at 12 noon.    [provider]      PHYSICAL EXAMINATION:   VITAL SIGNS: Last menstrual period 08/07/2019, unknown if currently breastfeeding.  GENERAL:  25 y.o.-year-old patient no acute distress.  HEENT: Head atraumatic, normocephalic. Oropharynx and nasopharynx clear. MP 1, TM distance >3 cm, normal mouth opening. LUNGS: No use of accessory muscles of respiration.   EXTREMITIES: No pedal edema, cyanosis, or clubbing.  NEUROLOGIC: normal gait PSYCHIATRIC: The patient is alert and oriented x 3.  SKIN: No obvious rash, lesion, or ulcer.    IMPRESSION AND PLAN:   Brittney Gill  is a 25 y.o. female presenting with obesity during pregnancy. BMI is currently 53 at [redacted]w[redacted]d weeks gestation.   We discussed analgesic options during labor including epidural analgesia. Discussed that in obesity there can be increased difficulty with epidural placement or even failure of successful epidural. We also discussed that even after  successful epidural placement there is increased risk of catheter migration out of the epidural space that would require catheter replacement. Discussed use of epidural vs spinal vs GA if cesarean delivery is required. Discussed increased risk of difficult intubation during pregnancy should an emergency cesarean delivery be required.   Patient does have OSA of unclear  significance given no sleep study in chart, however she has a good airway and I anticipate her course will be uncomplicated at this hospital.

## 2020-04-21 NOTE — Progress Notes (Signed)
ROB with NST and U/S Growth/AFI- no concerns

## 2020-04-28 ENCOUNTER — Other Ambulatory Visit: Payer: Self-pay

## 2020-04-28 ENCOUNTER — Other Ambulatory Visit (HOSPITAL_COMMUNITY)
Admission: RE | Admit: 2020-04-28 | Discharge: 2020-04-28 | Disposition: A | Discharge status: Home / Self Care | Payer: Managed Care, Other (non HMO) | Source: Ambulatory Visit | Attending: Obstetrics and Gynecology | Admitting: Obstetrics and Gynecology

## 2020-04-28 ENCOUNTER — Ambulatory Visit (INDEPENDENT_AMBULATORY_CARE_PROVIDER_SITE_OTHER): Payer: Managed Care, Other (non HMO) | Admitting: Obstetrics and Gynecology

## 2020-04-28 ENCOUNTER — Telehealth: Payer: Self-pay | Admitting: Internal Medicine

## 2020-04-28 ENCOUNTER — Other Ambulatory Visit: Payer: Self-pay | Admitting: Obstetrics and Gynecology

## 2020-04-28 ENCOUNTER — Ambulatory Visit (INDEPENDENT_AMBULATORY_CARE_PROVIDER_SITE_OTHER): Payer: Managed Care, Other (non HMO)

## 2020-04-28 VITALS — BP 122/80 | Wt 318.0 lb

## 2020-04-28 DIAGNOSIS — O0990 Supervision of high risk pregnancy, unspecified, unspecified trimester: Secondary | ICD-10-CM | POA: Insufficient documentation

## 2020-04-28 DIAGNOSIS — O99213 Obesity complicating pregnancy, third trimester: Secondary | ICD-10-CM

## 2020-04-28 DIAGNOSIS — O3663X0 Maternal care for excessive fetal growth, third trimester, not applicable or unspecified: Secondary | ICD-10-CM | POA: Insufficient documentation

## 2020-04-28 DIAGNOSIS — R109 Unspecified abdominal pain: Secondary | ICD-10-CM

## 2020-04-28 DIAGNOSIS — Z3A36 36 weeks gestation of pregnancy: Secondary | ICD-10-CM

## 2020-04-28 DIAGNOSIS — Z113 Encounter for screening for infections with a predominantly sexual mode of transmission: Secondary | ICD-10-CM | POA: Diagnosis present

## 2020-04-28 DIAGNOSIS — O26893 Other specified pregnancy related conditions, third trimester: Secondary | ICD-10-CM

## 2020-04-28 DIAGNOSIS — Z3689 Encounter for other specified antenatal screening: Secondary | ICD-10-CM

## 2020-04-28 DIAGNOSIS — Z3685 Encounter for antenatal screening for Streptococcus B: Secondary | ICD-10-CM

## 2020-04-28 DIAGNOSIS — Z3A39 39 weeks gestation of pregnancy: Secondary | ICD-10-CM

## 2020-04-28 LAB — FETAL NONSTRESS TEST

## 2020-04-28 NOTE — Telephone Encounter (Signed)
Approval  Prior Authorization Ref #:  [id number] Plan Name: [place] [facility] Plan Code:  [id number] Medication:  Saxenda 18MG  Authorization effective for a maximum of 12 fills from 04/28/2020 -04/27/2021

## 2020-04-28 NOTE — Patient Instructions (Signed)
.  amsf

## 2020-04-28 NOTE — Progress Notes (Signed)
Routine Prenatal Care Visit  Subjective  Brittney Gill is a 25 y.o. G3P2002 at [redacted]w[redacted]d being seen today for ongoing prenatal care.  She is currently monitored for the following issues for this high-risk pregnancy and has Anxiety; Morbid obesity (HCC); Depression; Vitamin D deficiency; Supervision of high risk pregnancy, antepartum; Depression with anxiety; Obesity affecting pregnancy; BMI 50.0-59.9, adult (HCC); Leukocytosis; Hyperglycemia; Abnormal finding on antenatal screen; Carrier of genetic disorder; Anemia in pregnancy, third trimester; Abdominal pain in pregnancy, third trimester; Nausea and vomiting during pregnancy; Diarrhea during pregnancy; and Macrosomia affecting management of mother in third trimester on their problem list.  ----------------------------------------------------------------------------------- Patient reports no complaints.   Contractions: Not present. Vag. Bleeding: None.  Movement: Present. Denies leaking of fluid.  ----------------------------------------------------------------------------------- The following portions of the patient's history were reviewed and updated as appropriate: allergies, current medications, past family history, past medical history, past social history, past surgical history and problem list. Problem list updated.   Objective  Blood pressure 122/80, weight (!) 318 lb (144.2 kg), last menstrual period 08/07/2019, unknown if currently breastfeeding. Pregravid weight 303 lb (137.4 kg) Total Weight Gain 15 lb (6.804 kg) Urinalysis:      Fetal Status: Fetal Heart Rate (bpm): 140   Movement: Present  Presentation: Vertex  General:  Alert, oriented and cooperative. Patient is in no acute distress.  Skin: Skin is warm and dry. No rash noted.   Cardiovascular: Normal heart rate noted  Respiratory: Normal respiratory effort, no problems with respiration noted  Abdomen: Soft, gravid, appropriate for gestational age. Pain/Pressure: Present      Pelvic:  Cervical exam performed Dilation: 3 Effacement (%): 50 Station: -2  Extremities: Normal range of motion.     ental Status: Normal mood and affect. Normal behavior. Normal judgment and thought content.   US OB Limited  Result Date: 04/14/2020 Patient Name: Brittney Gill DOB: 09-09-1995 MRN: [id number] ULTRASOUND REPORT Location: [facility] Date of Service: 04/14/2020 Indications:AFI Findings: Mason Jim intrauterine pregnancy is visualized with FHR at 147 BPM. Fetal presentation is Cephalic. Placenta: posterior fundal. Grade: 2 AFI: 13.1 cm Impression: 1. [redacted]w[redacted]d Viable Singleton Intrauterine pregnancy dated by previously established criteria. 2. AFI is 13.1 cm. Deanna Artis, RT The ultrasound images and findings were reviewed by me and I agree with the above report. Thomasene Mohair, MD, [facility], [facility] 04/14/2020 1:44 PM     US OB Follow Up  Result Date: 04/28/2020 Patient Name: Brittney Gill DOB: [birth year] MRN: [id number] ULTRASOUND REPORT Location: [facility] Date of Service: 04/28/2020 Indications:growth/afi Findings: Mason Jim intrauterine pregnancy is visualized with FHR at 148 BPM. Biometrics give an (U/S) Gestational age of [redacted]w[redacted]d and an (U/S) EDD of 04/30/2020; this correlates with the clinically established Estimated Date of Delivery: 05/22/20. Fetal presentation is Cephalic. Placenta: posterior. Grade: 3 AFI: 16.1 cm Growth percentile is > 90%.  AC percentile is >97.7%. EFW: 4141 g  ( 9 lb 2 oz ) Impression: 1. [redacted]w[redacted]d Viable Singleton Intrauterine pregnancy previously established criteria. 2. Growth is >90% %ile.  AFI is 16.1 cm. Recommendations: 1.Clinical correlation with the patient's History and Physical Exam. Deanna Artis, RT There is a singleton gestation with normal amniotic fluid volume. The fetal biometry correlates with established dating does show evidence of macrosomia.  Limited fetal anatomy was performed.The visualized  fetal anatomical survey appears within normal limits within the resolution of ultrasound as described above.  It must be noted that a normal ultrasound is unable to rule  out fetal aneuploidy.  Vena Austria, MD, [facility], [facility] 04/28/2020, 11:00 AM   US OB Follow Up  Result Date: 04/21/2020 Patient Name: Brittney Gill DOB: 09-24-1995 MRN: [id number] ULTRASOUND REPORT Location: [facility] Date of Service: 04/21/2020 Indications:growth/afi Findings: Mason Jim intrauterine pregnancy is visualized with FHR at 154 BPM. Biometrics give an (U/S) Gestational age of [redacted]w[redacted]d and an (U/S) EDD of 05/01/2020; this correlates with the clinically established Estimated Date of Delivery: 05/22/20. Fetal presentation is Cephalic. Placenta: posterior. Grade: 2 AFI: 13.9 cm Growth percentile is >90%.  AC percentile is >97.7%. EFW: 3789 g  ( 8 lb 6 oz ) Impression: 1. [redacted]w[redacted]d Viable Singleton Intrauterine pregnancy previously established criteria. 2. Growth is >90% %ile.  AFI is 13.9 cm. Recommendations: 1.Clinical correlation with the patient's History and Physical Exam. Deanna Artis, RT Review of ULTRASOUND.    I have personally reviewed images and report of recent ultrasound done at [facility].    Plan of management to be discussed with patient. Annamarie Major, MD, [facility], [facility] 04/21/2020  4:17 PM  US ABDOMEN LIMITED RUQ (LIVER/GB)  Result Date: 04/06/2020 CLINICAL DATA:  Right upper quadrant pain. EXAM: ULTRASOUND ABDOMEN LIMITED RIGHT UPPER QUADRANT COMPARISON:  None. FINDINGS: Gallbladder: No gallstones or wall thickening visualized. No sonographic Murphy sign noted by sonographer. Common bile duct: Diameter: 4.5 mm Liver: Increased echogenicity consistent fatty infiltration or hepatocellular disease. No focal hepatic abnormality identified. Portal vein is patent on color Doppler imaging with normal direction of blood flow towards the liver. Other: None.  IMPRESSION: 1. No gallstones or biliary distention. 2. Increased hepatic echogenicity consistent fatty infiltration or hepatocellular disease. Electronically Signed   By: Maisie Fus  Register   On: 04/06/2020 09:41   Baseline: 140 Variability: moderate Accelerations: present Decelerations: absent Tocometry: N/A The patient was monitored for 30 minutes, fetal heart rate tracing was deemed reactive, category I tracing,  CPT 02637   Assessment   24 y.o. [id number] at [redacted]w[redacted]d by  05/22/2020, by Ultrasound presenting for routine prenatal visit  Plan   pregnancy3# Problems (from 08/07/19 to present)    Problem Noted Resolved   Nausea and vomiting during pregnancy 04/06/2020 by Zipporah Plants, CNM No   Diarrhea during pregnancy 04/06/2020 by Zipporah Plants, CNM No   Anemia in pregnancy, third trimester 03/11/2020 by Vena Austria, MD No   Obesity affecting pregnancy 02/10/2018 by Conard Novak, MD No   Overview Addendum 12/31/2019  2:36 PM by Natale Milch, MD    Weight gain recommendation BMI> 30:  11-20 lbs  [x ] Screen of OSA- positive screen, referred for testing  [x]  Early diabetes screening for BMI>30  [x ] Consultation with Anesthesia- patient has upcoming appointment  Antenatal Testing BMI 35-39.9 Weekly at 37 weeks BMI 40 or > Weekly at 34 weeks        Previous Version   Supervision of high risk pregnancy, antepartum 02/02/2018 by 13/01/2018, MD No   Overview Addendum 04/03/2020  1:06 PM by 06/01/2020, CNM     Nursing Staff Provider  Office Location  [facility] Dating   6 wk Mirna Mires  Language  English Anatomy US   incomplete  Flu Vaccine  declined Genetic Screen   normal XX   TDaP vaccine    Hgb A1C or  GTT Early : hgba1c 5.2 Third trimester : 118  Rhogam   not needed   LAB RESULTS   Feeding Plan  Blood Type  B/Positive/-- (08/13 0839)   Contraception BTL, papers[x]  Antibody Negative (08/13 0839)  Circumcision  Rubella 10.60 (08/13 0839)  Pediatrician    RPR Non Reactive (12/17 1145)   Support Person  Dustin HBsAg Negative (08/13 0160)   Prenatal Classes  HIV Non Reactive (12/17 1145)    Varicella Imm  BTL Consent  GBS  (For PCN allergy, check sensitivities)        VBAC Consent n/a Pap  2019 NIL  Covid vaccine  vaccinated Hgb Electro      CF      SMA  carrier     Varicella immune     High risk Pregnancy Diagnoses Obesity in pregnancy- resolved by 32 weeks        Previous Version       Gestational age appropriate obstetric precautions including but not limited to vaginal bleeding, contractions, leaking of fluid and fetal movement were reviewed in detail with the patient.    - reactive NST - macrosomia on growth scan - GBS and aptima today  Return in about 1 week (around 05/05/2020) for ROB, NST, AFI.  Vena Austria, MD, Evern Core [facility], [facility] 04/28/2020, 11:24 AM

## 2020-04-30 LAB — CERVICOVAGINAL ANCILLARY ONLY
Chlamydia: NEGATIVE
Comment: NEGATIVE
Comment: NORMAL
Neisseria Gonorrhea: NEGATIVE

## 2020-04-30 LAB — STREP GP B NAA: Strep Gp B NAA: NEGATIVE

## 2020-05-05 ENCOUNTER — Other Ambulatory Visit: Payer: Self-pay

## 2020-05-05 ENCOUNTER — Encounter: Payer: Self-pay | Admitting: Obstetrics & Gynecology

## 2020-05-05 ENCOUNTER — Ambulatory Visit (INDEPENDENT_AMBULATORY_CARE_PROVIDER_SITE_OTHER): Payer: Managed Care, Other (non HMO) | Admitting: Obstetrics & Gynecology

## 2020-05-05 VITALS — BP 130/90 | Wt 318.0 lb

## 2020-05-05 DIAGNOSIS — O0993 Supervision of high risk pregnancy, unspecified, third trimester: Secondary | ICD-10-CM

## 2020-05-05 DIAGNOSIS — O99213 Obesity complicating pregnancy, third trimester: Secondary | ICD-10-CM | POA: Diagnosis not present

## 2020-05-05 DIAGNOSIS — Z3A37 37 weeks gestation of pregnancy: Secondary | ICD-10-CM

## 2020-05-05 NOTE — Progress Notes (Signed)
  Subjective  Fetal Movement? yes Contractions? no Leaking Fluid? no Vaginal Bleeding? no  Objective  BP 130/90   Wt (!) 318 lb (144.2 kg)   LMP 08/07/2019   BMI 52.92 kg/m  General: NAD Pumonary: no increased work of breathing Abdomen: gravid, non-tender Extremities: no edema Psychiatric: mood appropriate, affect full  A NST procedure was performed with FHR monitoring and a normal baseline established, appropriate time of 20-40 minutes of evaluation, and accels >2 seen w 15x15 characteristics.  Results show a REACTIVE NST.   Assessment  25 y.o. [id number] at [redacted]w[redacted]d by  05/22/2020, by Ultrasound presenting for routine prenatal visit  Plan   Problem List Items Addressed This Visit      Other   Supervision of high risk pregnancy, antepartum   Obesity affecting pregnancy    Other Visit Diagnoses    [redacted] weeks gestation of pregnancy    -  Primary      pregnancy3# Problems (from 08/07/19 to present)    Problem Noted Resolved   Nausea and vomiting during pregnancy 04/06/2020 by Zipporah Plants, CNM No   Diarrhea during pregnancy 04/06/2020 by Zipporah Plants, CNM No   Anemia in pregnancy, third trimester 03/11/2020 by Vena Austria, MD No   Obesity affecting pregnancy 02/10/2018 by Conard Novak, MD No   Overview Addendum 12/31/2019  2:36 PM by Natale Milch, MD    Weight gain recommendation BMI> 30:  11-20 lbs  [x ] Screen of OSA- positive screen, referred for testing  [x]  Early diabetes screening for BMI>30  [x ] Consultation with Anesthesia- patient has upcoming appointment  Antenatal Testing BMI 35-39.9 Weekly at 37 weeks BMI 40 or > Weekly at 34 weeks        Previous Version   Supervision of high risk pregnancy, antepartum 02/02/2018 by 13/01/2018, MD No   Overview Addendum 04/03/2020  1:06 PM by 06/01/2020, CNM     Nursing Staff Provider  Office Location  [facility] Dating   6 wk Mirna Mires  Language  English Anatomy US   incomplete  Flu Vaccine   declined Genetic Screen   normal XX   TDaP vaccine    Hgb A1C or  GTT Early : hgba1c 5.2 Third trimester : 118  Rhogam   not needed   LAB RESULTS   Feeding Plan  Blood Type B/Positive/-- (08/13 0839)   Contraception BTL, papers[x]  Antibody Negative (08/13 0839)  Circumcision  Rubella 10.60 (08/13 0839)  Pediatrician   RPR Non Reactive (12/17 1145)   Support Person  Dustin HBsAg Negative (08/13 0839)   Prenatal Classes  HIV Non Reactive (12/17 1145)    Varicella Imm  BTL Consent  GBS  (For PCN allergy, check sensitivities)        VBAC Consent n/a Pap  2019 NIL  Covid vaccine  vaccinated Hgb Electro      CF      SMA  carrier     Varicella immune     High risk Pregnancy Diagnoses Obesity in pregnancy- resolved by 32 weeks        NST AFI next week  IOL sch for 2/21 at 0500  3/21, MD, [facility], [facility] 05/05/2020  4:07 PM

## 2020-05-05 NOTE — Progress Notes (Signed)
  [name] [facility] INDU[facility] ASSESSMENT Brittney Gill 07-15-1995 Medical record #: [id number] Phone #:  Home Phone [phone]  Mobile [phone]    Prenatal Provider:[facility] Delivering Group:[facility] Proposed admission date/time:05/15/20 0500 Method of induction:Cytotec  Weight: Filed Weights02/11/22 1532Weight:(!) 318 lb (144.2 kg) BMI Body mass index is 52.92 kg/m. HIV Negative HSV Negative EDC Estimated Date of Delivery: 2/28/22based on:LMP  Gestational age on admission: 106 Gravidity/parity:G3P2002  Cervix Score   0 1 2 3   Position Posterior Midposition Anterior   Consistency Firm Medium Soft   Effacement (%) 0-30 40-50 60-70 >80  Dilation (cm) Closed 1-2 3-4 >5  Baby's station -3 -2 -1 +1, +2   Bishop Score:5   Medical induction of labor    Obesity and Macrosomia  AND ?39 weeks multiparous patient   Medical Indications Adapted from ACOG Committee Opinion #[id number], "Medically Indicated Late Preterm and Early Term Deliveries," 2013.  Provider Signature: 2014 Scheduled Letitia Libra Date:05/05/2020 4:11 PM   Call [phone] to finalize the induction date/time  [phone] (07/17)

## 2020-05-05 NOTE — Patient Instructions (Addendum)
Induction 05/15/20 at 0500! Plan for Covid test Friday morning  PRE ADMISSION TESTING For Covid, prior to procedure Friday 05/12/20  9:00-10:00 [facility] entrance (drive up)  Results in 95-63 hours You will not receive notification if test results are negative. If positive for Covid19, your provider will notify you by phone, with additional instructions.   Labor Induction Labor induction is when steps are taken to cause a pregnant woman to begin the labor process. Most women go into labor on their own between 37 weeks and 42 weeks of pregnancy. When this does not happen, or when there is a medical need for labor to begin, steps may be taken to induce, or bring on, labor. Labor induction causes a pregnant woman's uterus to contract. It also causes the cervix to soften (ripen), open (dilate), and thin out. Usually, labor is not induced before 39 weeks of pregnancy unless there is a medical reason to do so. When is labor induction considered? Labor induction may be right for you if:  Your pregnancy lasts longer than 41 to 42 weeks.  Your placenta is separating from your uterus (placental abruption).  You have a rupture of membranes and your labor does not begin.  You have health problems, like diabetes or high blood pressure (preeclampsia) during your pregnancy.  Your baby has stopped growing or does not have enough amniotic fluid. Before labor induction begins, your health care provider will consider the following factors:  Your medical condition and the baby's condition.  How many weeks you have been pregnant.  How mature the baby's lungs are.  The condition of your cervix.  The position of the baby.  The size of your birth canal. Tell a health care provider about:  Any allergies you have.  All medicines you are taking, including vitamins, herbs, eye drops, creams, and over-the-counter medicines.  Any problems you or your family members have had with anesthetic  medicines.  Any surgeries you have had.  Any blood disorders you have.  Any medical conditions you have. What are the risks? Generally, this is a safe procedure. However, problems may occur, including:  Failed induction.  Changes in fetal heart rate, such as being too high, too low, or irregular (erratic).  Infection in the mother or the baby.  Increased risk of having a cesarean delivery.  Breaking off (abruption) of the placenta from the uterus. This is rare.  Rupture of the uterus. This is very rare.  Your baby could fail to get enough blood flow or oxygen. This can be life-threatening. When induction is needed for medical reasons, the benefits generally outweigh the risks. What happens during the procedure? During the procedure, your health care provider will use one of these methods to induce labor:  Stripping the membranes. In this method, the amniotic sac tissue is gently separated from the cervix. This causes the following to happen: ? Your cervix stretches, which in turn causes the release of prostaglandins. ? Prostaglandins induce labor and cause the uterus to contract. ? This procedure is often done in an office visit. You will be sent home to wait for contractions to begin.  Prostaglandin medicine. This medicine starts contractions and causes the cervix to dilate and ripen. This can be taken by mouth (orally) or by being inserted into the vagina (suppository).  Inserting a small, thin tube (catheter) with a balloon into the vagina and then expanding the balloon with water to dilate the cervix.  Breaking the water. In this method, a small  instrument is used to make a small hole in the amniotic sac. This eventually causes the amniotic sac to break. Contractions should begin within a few hours.  Medicine to trigger or strengthen contractions. This medicine is given through an IV that is inserted into a vein in your arm. This procedure may vary among health care  providers and hospitals.   Where to find more information  [employer]: www.marchofdimes.org  [employer]: www.acog.org Summary  Labor induction causes a pregnant woman's uterus to contract. It also causes the cervix to soften (ripen), open (dilate), and thin out.  Labor is usually not induced before 39 weeks of pregnancy unless there is a medical reason to do so.  When induction is needed for medical reasons, the benefits generally outweigh the risks.  Talk with your health care provider about which methods of labor induction are right for you. This information is not intended to replace advice given to you by your health care provider. Make sure you discuss any questions you have with your health care provider. Document Revised: 12/23/2019 Document Reviewed: 12/23/2019 Elsevier Patient Education  2021 [employer].

## 2020-05-05 NOTE — Addendum Note (Signed)
Addended by: Nadara Mustard on: 05/05/2020 04:16 PM   Modules accepted: Orders, SmartSet

## 2020-05-11 ENCOUNTER — Encounter: Payer: Self-pay | Admitting: Obstetrics and Gynecology

## 2020-05-11 ENCOUNTER — Encounter: Payer: Managed Care, Other (non HMO) | Admitting: Advanced Practice Midwife

## 2020-05-11 ENCOUNTER — Ambulatory Visit: Payer: Managed Care, Other (non HMO)

## 2020-05-11 ENCOUNTER — Observation Stay: Payer: Managed Care, Other (non HMO)

## 2020-05-11 ENCOUNTER — Ambulatory Visit (INDEPENDENT_AMBULATORY_CARE_PROVIDER_SITE_OTHER): Payer: Managed Care, Other (non HMO) | Admitting: Advanced Practice Midwife

## 2020-05-11 ENCOUNTER — Observation Stay
Admission: EM | Admit: 2020-05-11 | Discharge: 2020-05-11 | Disposition: A | Discharge status: Home / Self Care | Payer: Managed Care, Other (non HMO) | Attending: Obstetrics and Gynecology | Admitting: Obstetrics and Gynecology

## 2020-05-11 ENCOUNTER — Encounter: Payer: Self-pay | Admitting: Advanced Practice Midwife

## 2020-05-11 ENCOUNTER — Other Ambulatory Visit: Payer: Self-pay

## 2020-05-11 VITALS — BP 140/92 | Wt 319.0 lb

## 2020-05-11 DIAGNOSIS — O0993 Supervision of high risk pregnancy, unspecified, third trimester: Secondary | ICD-10-CM

## 2020-05-11 DIAGNOSIS — O133 Gestational [pregnancy-induced] hypertension without significant proteinuria, third trimester: Principal | ICD-10-CM | POA: Insufficient documentation

## 2020-05-11 DIAGNOSIS — Z3A38 38 weeks gestation of pregnancy: Secondary | ICD-10-CM

## 2020-05-11 DIAGNOSIS — Z87891 Personal history of nicotine dependence: Secondary | ICD-10-CM | POA: Diagnosis not present

## 2020-05-11 DIAGNOSIS — Z20822 Contact with and (suspected) exposure to covid-19: Secondary | ICD-10-CM | POA: Diagnosis not present

## 2020-05-11 DIAGNOSIS — O99213 Obesity complicating pregnancy, third trimester: Secondary | ICD-10-CM

## 2020-05-11 DIAGNOSIS — O0990 Supervision of high risk pregnancy, unspecified, unspecified trimester: Secondary | ICD-10-CM

## 2020-05-11 DIAGNOSIS — O163 Unspecified maternal hypertension, third trimester: Secondary | ICD-10-CM | POA: Diagnosis present

## 2020-05-11 DIAGNOSIS — Z3490 Encounter for supervision of normal pregnancy, unspecified, unspecified trimester: Secondary | ICD-10-CM

## 2020-05-11 DIAGNOSIS — O3663X0 Maternal care for excessive fetal growth, third trimester, not applicable or unspecified: Secondary | ICD-10-CM

## 2020-05-11 LAB — COMPREHENSIVE METABOLIC PANEL
ALT: 24 U/L (ref 0–44)
AST: 25 U/L (ref 15–41)
Albumin: 2.9 g/dL — ABNORMAL LOW (ref 3.5–5.0)
Alkaline Phosphatase: 106 U/L (ref 38–126)
Anion gap: 8 (ref 5–15)
BUN: 8 mg/dL (ref 6–20)
CO2: 23 mmol/L (ref 22–32)
Calcium: 8.7 mg/dL — ABNORMAL LOW (ref 8.9–10.3)
Chloride: 104 mmol/L (ref 98–111)
Creatinine, Ser: 0.51 mg/dL (ref 0.44–1.00)
GFR, Estimated: >60 mL/min (ref >60)
Glucose, Bld: 88 mg/dL (ref 70–99)
Potassium: 3.8 mmol/L (ref 3.5–5.1)
Sodium: 135 mmol/L (ref 135–145)
Total Bilirubin: 0.3 mg/dL (ref 0.3–1.2)
Total Protein: 6.9 g/dL (ref 6.5–8.1)

## 2020-05-11 LAB — PROTEIN / CREATININE RATIO, URINE
Creatinine, Urine: 172 mg/dL
Protein Creatinine Ratio: 0.18 mg/mg{Cre} — ABNORMAL HIGH (ref 0.00–0.15)
Total Protein, Urine: 31 mg/dL

## 2020-05-11 LAB — POCT URINALYSIS DIPSTICK OB: Glucose, UA: NEGATIVE

## 2020-05-11 LAB — CBC
HCT: 31 % — ABNORMAL LOW (ref 36.0–46.0)
Hemoglobin: 9.7 g/dL — ABNORMAL LOW (ref 12.0–15.0)
MCH: 24 pg — ABNORMAL LOW (ref 26.0–34.0)
MCHC: 31.3 g/dL (ref 30.0–36.0)
MCV: 76.7 fL — ABNORMAL LOW (ref 80.0–100.0)
Platelets: 406 10*3/uL — ABNORMAL HIGH (ref 150–400)
RBC: 4.04 MIL/uL (ref 3.87–5.11)
RDW: 13.7 % (ref 11.5–15.5)
WBC: 11.2 10*3/uL — ABNORMAL HIGH (ref 4.0–10.5)
nRBC: 0 % (ref 0.0–0.2)

## 2020-05-11 LAB — FETAL NONSTRESS TEST

## 2020-05-11 LAB — TYPE AND SCREEN
ABO/RH(D): B POS
Antibody Screen: NEGATIVE

## 2020-05-11 NOTE — Discharge Summary (Signed)
Physician Final Progress Note  Patient ID: Brittney Gill MRN: [id number] DOB/AGE: [birth year] 25 y.o.  Admit date: 05/11/2020 Admitting provider: Vena Austria, MD Discharge date: 05/11/2020   Admission Diagnoses: Elevated BP in clinic  Discharge Diagnoses:  Active Problems:   Pregnancy   Elevated blood pressure affecting pregnancy in third trimester, antepartum  25 y.o. [id number] at [redacted]w[redacted]d presenting with asymptomatic BP elevation in clinic today.  BP on L&D normotensive throughout evluation with exception of one mild range BP on admission.  Labs normal.  AFI normal.  +FM, no LOF, no VB, no CTX.  IOL scheduled for [redacted]w[redacted]d  Blood pressure 126/72, pulse 86, temperature 98.3 F (36.8 C), temperature source Oral, resp. rate 18, height 5\' 5"  (1.651 m), weight (!) 144.7 kg, last menstrual period 08/07/2019, unknown if currently breastfeeding.   Consults: None  Significant Findings/ Diagnostic Studies:  08/09/2019 OB Limited  Result Date: 05/11/2020 CLINICAL DATA:  Amniotic fluid index, obesity. EXAM: LIMITED OBSTETRIC ULTRASOUND FINDINGS: Number of Fetuses: 1 Heart Rate:  155 bpm Movement: Yes Presentation: Cephalic Placental Location: Posterior Previa: Could not be visualized cervix entirely due to gestational age. Could not evaluate for previa. Amniotic Fluid (Subjective):  Within normal limits. AFI: 14.1 cm BPD: 9.6 cm 39 w  3 d MATERNAL FINDINGS: Cervix: As noted above, could not be visualized due to gestational age. Uterus/Adnexae: No abnormality visualized. IMPRESSION: Single live intrauterine gestation of 39 weeks 3 days. This exam is performed on an emergent basis and does not comprehensively evaluate fetal size, dating, or anatomy; follow-up complete OB 05/13/2020 should be considered if further fetal assessment is warranted. Electronically Signed   By: [place] M.D.   On: 05/11/2020 15:08   05/13/2020 OB Limited  Result Date: 04/14/2020 Patient Name: Brittney Gill DOB: 1995-04-05 MRN: [id number]  ULTRASOUND REPORT Location: [facility] Date of Service: 04/14/2020 Indications:AFI Findings: 04/16/2020 intrauterine pregnancy is visualized with FHR at 147 BPM. Fetal presentation is Cephalic. Placenta: posterior fundal. Grade: 2 AFI: 13.1 cm Impression: 1. [redacted]w[redacted]d Viable Singleton Intrauterine pregnancy dated by previously established criteria. 2. AFI is 13.1 cm. [redacted]w[redacted]d, RT The ultrasound images and findings were reviewed by me and I agree with the above report. Deanna Artis, MD, [facility], [facility] 04/14/2020 1:44 PM     04/16/2020 OB Follow Up  Result Date: 04/28/2020 Patient Name: Brittney Gill DOB: 05-18-1995 MRN: [id number] ULTRASOUND REPORT Location: [facility] Date of Service: 04/28/2020 Indications:growth/afi Findings: 06/26/2020 intrauterine pregnancy is visualized with FHR at 148 BPM. Biometrics give an (U/S) Gestational age of 105w5d and an (U/S) EDD of 04/30/2020; this correlates with the clinically established Estimated Date of Delivery: 05/22/20. Fetal presentation is Cephalic. Placenta: posterior. Grade: 3 AFI: 16.1 cm Growth percentile is > 90%.  AC percentile is >97.7%. EFW: 4141 g  ( 9 lb 2 oz ) Impression: 1. [redacted]w[redacted]d Viable Singleton Intrauterine pregnancy previously established criteria. 2. Growth is >90% %ile.  AFI is 16.1 cm. Recommendations: 1.Clinical correlation with the patient's History and Physical Exam. [redacted]w[redacted]d, RT There is a singleton gestation with normal amniotic fluid volume. The fetal biometry correlates with established dating does show evidence of macrosomia.  Limited fetal anatomy was performed.The visualized fetal anatomical survey appears within normal limits within the resolution of ultrasound as described above.  It must be noted that a normal ultrasound is unable to rule out fetal aneuploidy.  Deanna Artis, MD, Vena Austria [facility], [facility] 04/28/2020, 11:00 AM  US OB Follow Up  Result Date:  04/21/2020 Patient Name: Brittney Gill DOB: [birth year] MRN: [id number] ULTRASOUND REPORT Location: [facility] Date of Service: 04/21/2020 Indications:growth/afi Findings: Mason Jim intrauterine pregnancy is visualized with FHR at 154 BPM. Biometrics give an (U/S) Gestational age of [redacted]w[redacted]d and an (U/S) EDD of 05/01/2020; this correlates with the clinically established Estimated Date of Delivery: 05/22/20. Fetal presentation is Cephalic. Placenta: posterior. Grade: 2 AFI: 13.9 cm Growth percentile is >90%.  AC percentile is >97.7%. EFW: 3789 g  ( 8 lb 6 oz ) Impression: 1. [redacted]w[redacted]d Viable Singleton Intrauterine pregnancy previously established criteria. 2. Growth is >90% %ile.  AFI is 13.9 cm. Recommendations: 1.Clinical correlation with the patient's History and Physical Exam. Deanna Artis, RT Review of ULTRASOUND.    I have personally reviewed images and report of recent ultrasound done at [facility].    Plan of management to be discussed with patient. Annamarie Major, MD, [facility], [facility] 04/21/2020  4:17 PM  Results for orders placed or performed during the hospital encounter of 05/11/20 (from the past 24 hour(s))  Comprehensive metabolic panel     Status: Abnormal   Collection Time: 05/11/20 11:49 AM  Result Value Ref Range   Sodium 135 135 - 145 mmol/L   Potassium 3.8 3.5 - 5.1 mmol/L   Chloride 104 98 - 111 mmol/L   CO2 23 22 - 32 mmol/L   Glucose, Bld 88 70 - 99 mg/dL   BUN 8 6 - 20 mg/dL   Creatinine, Ser 6.23 0.44 - 1.00 mg/dL   Calcium 8.7 (L) 8.9 - 10.3 mg/dL   Total Protein 6.9 6.5 - 8.1 g/dL   Albumin 2.9 (L) 3.5 - 5.0 g/dL   AST 25 15 - 41 U/L   ALT 24 0 - 44 U/L   Alkaline Phosphatase 106 38 - 126 U/L   Total Bilirubin 0.3 0.3 - 1.2 mg/dL   GFR, Estimated >76 >28 mL/min   Anion gap 8 5 - 15  CBC on admission     Status: Abnormal   Collection Time: 05/11/20 11:49 AM  Result Value Ref Range   WBC 11.2 (H) 4.0 - 10.5 K/uL   RBC 4.04 3.87 - 5.11  MIL/uL   Hemoglobin 9.7 (L) 12.0 - 15.0 g/dL   HCT 31.5 (L) 17.6 - 16.0 %   MCV 76.7 (L) 80.0 - 100.0 fL   MCH 24.0 (L) 26.0 - 34.0 pg   MCHC 31.3 30.0 - 36.0 g/dL   RDW 73.7 10.6 - 26.9 %   Platelets 406 (H) 150 - 400 K/uL   nRBC 0.0 0.0 - 0.2 %  Type and screen [facility]     Status: None   Collection Time: 05/11/20 11:49 AM  Result Value Ref Range   ABO/RH(D) B POS    Antibody Screen NEG    Sample Expiration      05/14/2020,2359 Performed at [facility], [address] [place], [place] [address]   Protein / creatinine ratio, urine     Status: Abnormal   Collection Time: 05/11/20 11:50 AM  Result Value Ref Range   Creatinine, Urine 172 mg/dL   Total Protein, Urine 31 mg/dL   Protein Creatinine Ratio 0.18 (H) 0.00 - 0.15 mg/mg[Cre]     Procedures:  Baseline: 130 Variability: moderate Accelerations: present Decelerations: absent Tocometry: irregular every 6-70min The patient was monitored for 30 minutes, fetal heart rate tracing was deemed reactive, category I tracing,  CPT [phone]   Discharge Condition: good  Disposition: Discharge disposition: 01-Home or Self Care       Diet: Regular diet  Discharge Activity: Activity as tolerated  Discharge Instructions    Discharge activity:  No Restrictions   Complete by: As directed    Discharge diet:  No restrictions   Complete by: As directed    Fetal Kick Count:  Lie on our left side for one hour after a meal, and count the number of times your baby kicks.  If it is less than 5 times, get up, move around and drink some juice.  Repeat the test 30 minutes later.  If it is still less than 5 kicks in an hour, notify your doctor.   Complete by: As directed    LABOR:  When conractions begin, you should start to time them from the beginning of one contraction to the beginning  of the next.  When contractions are 5 - 10 minutes apart or less and have been regular for at least an hour, you  should call your health care provider.   Complete by: As directed    No sexual activity restrictions   Complete by: As directed    Notify physician for bleeding from the vagina   Complete by: As directed    Notify physician for blurring of vision or spots before the eyes   Complete by: As directed    Notify physician for chills or fever   Complete by: As directed    Notify physician for fainting spells, "black outs" or loss of consciousness   Complete by: As directed    Notify physician for increase in vaginal discharge   Complete by: As directed    Notify physician for leaking of fluid   Complete by: As directed    Notify physician for pain or burning when urinating   Complete by: As directed    Notify physician for pelvic pressure (sudden increase)   Complete by: As directed    Notify physician for severe or continued nausea or vomiting   Complete by: As directed    Notify physician for sudden gushing of fluid from the vagina (with or without continued leaking)   Complete by: As directed    Notify physician for sudden, constant, or occasional abdominal pain   Complete by: As directed    Notify physician if baby moving less than usual   Complete by: As directed      Allergies as of 05/11/2020      Reactions   Other Itching   Vitamin E cream   Diflucan [fluconazole] Rash      Medication List    TAKE these medications   famotidine 20 MG tablet Commonly known as: PEPCID Take 20 mg by mouth 2 (two) times daily.   ferrous sulfate 325 (65 FE) MG tablet Commonly known as: FerrouSul Take 1 tablet (325 mg total) by mouth daily with breakfast.   multivitamin-prenatal 27-0.8 MG Tabs tablet Take 1 tablet by mouth daily at 12 noon.        Total time spent taking care of this patient: 30 minutes  Signed: Vena Austria 05/11/2020, 4:06 PM

## 2020-05-11 NOTE — Progress Notes (Signed)
Discharge instructions reviewed with patient who verbalized understanding.

## 2020-05-11 NOTE — Progress Notes (Signed)
Patient G3P2 [redacted]w[redacted]d presents to L&D from office for [facility] workup. Patient denies vaginal bleeding and reports her normal discharge- white, malodorous. Patient reports positive fetal movement. Patient reports feeling occasional contractions, but nothing regular or consistent. She reports no headache or visual changes, but states she had some blurrier vision earlier, but currently no blurry vision. She has +2 reflexes and absent clonus. BP on arrival was 145/84. BPs cycling and monitors applied and assessing. CNM aware of patient arrival and has put in for labs to be drawn.

## 2020-05-11 NOTE — H&P (Signed)
Obstetric H&P   Chief Complaint: Elevated BP at [facility]  Prenatal Care Provider: Consuella Lose  History of Present Illness: 25 y.o. [id number] [redacted]w[redacted]d by 05/22/2020, by 6wk [place] presenting to L&D for [facility] work-up following elevated BP in office this morning. Patient noted to have a BP of 140/92 at her ROB visit. Patient denies LOF, VB, HA, RUQ pain, or visual disturbances. Patient reports intermittent contractions occurring since membrane sweep at office. SVE in office was 5/60-70/-2/vtx. Patient states she is "ready" to have her baby. 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  Pregravid weight: 303 lb (137.4 kg) Total Weight Gain: 16 lb   Review of Systems: 10 point review of systems negative unless otherwise noted in HPI  Past Medical History: Patient Active Problem List   Diagnosis Date Noted  . Pregnancy 05/11/2020  . Elevated blood pressure affecting pregnancy in third trimester, antepartum 05/11/2020  . Macrosomia affecting management of mother in third trimester 04/28/2020  . Anemia in pregnancy, third trimester 03/11/2020  . 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    . 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 Genetic referral made 11/22/2019   . Leukocytosis 08/27/2019  . Hyperglycemia 08/27/2019  . Depression with anxiety 02/10/2018  . Obesity affecting pregnancy 02/10/2018    Weight gain recommendation BMI> 30:  11-20 lbs  [x ] Screen of OSA- positive screen, referred for testing  [x]  Early diabetes screening for BMI>30  [x ] Consultation with Anesthesia- patient has upcoming appointment  Antenatal Testing BMI 35-39.9 Weekly at 37 weeks BMI 40 or > Weekly at 34 weeks     . BMI 50.0-59.9, adult (HCC)  02/10/2018  . Supervision of high risk pregnancy, antepartum 02/02/2018     Nursing Staff Provider  Office Location  [facility] Dating   6 wk 13/01/2018  Language  English Anatomy US   incomplete  Flu Vaccine  declined Genetic Screen   normal XX   TDaP vaccine    Hgb A1C or  GTT Early : hgba1c 5.2 Third trimester : 118  Rhogam   not needed   LAB RESULTS   Feeding Plan  Blood Type B/Positive/-- (08/13 0839)   Contraception BTL, papers[x]  Antibody Negative (08/13 0839)  Circumcision  Rubella 10.60 (08/13 0839)  Pediatrician   RPR Non Reactive (12/17 1145)   Support Person  Dustin HBsAg Negative (08/13 12-28-2005)   Prenatal Classes  HIV Non Reactive (12/17 1145)    Varicella Imm  BTL Consent  GBS  (For PCN allergy, check sensitivities)        VBAC Consent n/a Pap  2019 NIL  Covid vaccine  vaccinated Hgb Electro      CF      SMA  carrier     Varicella immune     High risk Pregnancy Diagnoses Obesity in pregnancy- resolved by 32 weeks     . Vitamin D deficiency 10/16/2017  . Anxiety 07/01/2016  . Morbid obesity (HCC) 09/23/2011    Past Surgical History: Past Surgical History:  Procedure Laterality Date  . FRACTURE SURGERY     Left  . WISDOM TOOTH EXTRACTION      Past Obstetric History: # 1 - Date: 01/25/16, Sex: Female, Weight: 3175 g, GA: [redacted]w[redacted]d, Delivery: Vaginal, Spontaneous, Apgar1: 8, Apgar5: 9,  Living: Living, Birth Comments: None  # 2 - Date: 09/22/18, Sex: Female, Weight: 3780 g, GA: [redacted]w[redacted]d, Delivery: Vaginal, Spontaneous, Apgar1: 9, Apgar5: 9, Living: Living, Birth Comments: None  # 3 - Date: None, Sex: None, Weight: None, GA: None, Delivery: None, Apgar1: None, Apgar5: None, Living: None, Birth Comments: None   Past Gynecologic History:  Family History: History reviewed. No pertinent family history.  Social History: Social History   Socioeconomic History  . Marital status: Married    Spouse name: Not on file  . Number of children: 2  . Years of education: Not on  file  . Highest education level: High school graduate  Occupational History  . Not on file  Tobacco Use  . Smoking status: Former Smoker    Packs/day: 0.50    Types: Cigarettes    Start date: 04/01/2016    Quit date: 12/23/2017    Years since quitting: 2.3  . Smokeless tobacco: Never Used  . Tobacco comment: Had quit in 2017, started back in 2018 but quit when she found out she was pregnant.  Vaping Use  . Vaping Use: Never used  Substance and Sexual Activity  . Alcohol use: No    Comment: social-not often  . Drug use: No  . Sexual activity: Yes    Birth control/protection: Surgical    Comment: vasectomy  Other Topics Concern  . Not on file  Social History Narrative  . Not on file   Social Determinants of Health   Financial Resource Strain: Not on file  Food Insecurity: Not on file  Transportation Needs: Not on file  Physical Activity: Not on file  Stress: Not on file  Social Connections: Not on file  Intimate Partner Violence: Not on file    Medications: Prior to Admission medications   Medication Sig Start Date End Date Taking? Authorizing Provider  famotidine (PEPCID) 20 MG tablet Take 20 mg by mouth 2 (two) times daily.   Yes [provider]  ferrous sulfate (FERROUSUL) 325 (65 FE) MG tablet Take 1 tablet (325 mg total) by mouth daily with breakfast. 03/11/20  Yes Vena Austria, MD  Prenatal Vit-Fe Fumarate-FA (MULTIVITAMIN-PRENATAL) 27-0.8 MG TABS tablet Take 1 tablet by mouth daily at 12 noon.   Yes [provider]    Allergies: Allergies  Allergen Reactions  . Other Itching    Vitamin E cream  . Diflucan [Fluconazole] Rash    Physical Exam: Vitals: Blood pressure 131/80, pulse 86, temperature 98.3 F (36.8 C), temperature source Oral, resp. rate 16, height 5\' 5"  (1.651 m), weight (!) 144.7 kg, last menstrual period 08/07/2019, unknown if currently breastfeeding.   FHT: 150 - moderate variabiliy, + 15x15 accels, - decels Toco: 4-7  mins, irregular contraction pattern - palpate mild  General: NAD HEENT: normocephalic, anicteric Pulmonary: No increased work of breathing Cardiovascular: RRR, distal pulses 2+ Abdomen: Gravid, non-tender Genitourinary: pending Extremities: no edema, erythema, or tenderness Neurologic: Grossly intact Psychiatric: mood appropriate, affect full  Labs: Results for orders placed or performed during the hospital encounter of 05/11/20 (from the past 24 hour(s))  Comprehensive metabolic panel     Status: Abnormal   Collection Time: 05/11/20 11:49 AM  Result Value Ref Range   Sodium 135 135 - 145 mmol/L   Potassium 3.8 3.5 - 5.1 mmol/L   Chloride 104 98 - 111 mmol/L   CO2 23 22 - 32 mmol/L   Glucose, Bld 88 70 - 99 mg/dL   BUN 8 6 - 20 mg/dL  Creatinine, Ser 0.51 0.44 - 1.00 mg/dL   Calcium 8.7 (L) 8.9 - 10.3 mg/dL   Total Protein 6.9 6.5 - 8.1 g/dL   Albumin 2.9 (L) 3.5 - 5.0 g/dL   AST 25 15 - 41 U/L   ALT 24 0 - 44 U/L   Alkaline Phosphatase 106 38 - 126 U/L   Total Bilirubin 0.3 0.3 - 1.2 mg/dL   GFR, Estimated >43 >15 mL/min   Anion gap 8 5 - 15  CBC on admission     Status: Abnormal   Collection Time: 05/11/20 11:49 AM  Result Value Ref Range   WBC 11.2 (H) 4.0 - 10.5 K/uL   RBC 4.04 3.87 - 5.11 MIL/uL   Hemoglobin 9.7 (L) 12.0 - 15.0 g/dL   HCT 40.0 (L) 86.7 - 61.9 %   MCV 76.7 (L) 80.0 - 100.0 fL   MCH 24.0 (L) 26.0 - 34.0 pg   MCHC 31.3 30.0 - 36.0 g/dL   RDW 50.9 32.6 - 71.2 %   Platelets 406 (H) 150 - 400 K/uL   nRBC 0.0 0.0 - 0.2 %  Type and screen [facility]     Status: None (Preliminary result)   Collection Time: 05/11/20 11:49 AM  Result Value Ref Range   ABO/RH(D) PENDING    Antibody Screen PENDING    Sample Expiration      05/14/2020,2359 Performed at [facility], [address] [place], [place] [address]   Protein / creatinine ratio, urine     Status: Abnormal   Collection Time: 05/11/20 11:50 AM  Result Value Ref  Range   Creatinine, Urine 172 mg/dL   Total Protein, Urine 31 mg/dL   Protein Creatinine Ratio 0.18 (H) 0.00 - 0.15 mg/mg[Cre]    Assessment: 24 y.o. [id number] [redacted]w[redacted]d by 05/22/2020, by 6 wk [place] for PIH work-up and labor evaluation.  Plan: 1) PIH work-up CBC/CMP/PC ratio - wnl, patient with single elevated BP during initial triage assessment, subsequently normotensive  2) Fetus - reassuring- category I tracing  - patient needs AFI - was unable to complete in office today, ordered  3) PNL - Blood type --/--/PENDING (02/17 1149) / Anti-bodyscreen PENDING (02/17 1149) / Rubella 10.60 (08/13 0839) / Varicella immune / RPR Non Reactive (12/17 1145) / HBsAg Negative (08/13 0839) / HIV Non Reactive (12/17 1145) / 1-hr OGTT 119 / GBS Negative/-- (02/04 1204)  4) Immunization History -  Immunization History  Administered Date(s) Administered  . Influenza,inj,Quad PF,6+ Mos 01/21/2020  . Meningococcal Conjugate 09/19/2014  . Moderna Sars-Covid-2 Vaccination 06/28/2019, 07/26/2019  . PPD Test 11/25/2012, 09/19/2014  . Tdap 11/25/2012, 07/13/2018, 03/23/2020    5) Disposition - PIH work-up wnl, will assess AFI, and recheck cervix for labor progress  Zipporah Plants, CNM, MSN [facility], [facility] 05/11/2020, 1:19 PM

## 2020-05-11 NOTE — Progress Notes (Signed)
No vb. No lof. NST today. No u/s for AFI, BP 144/90

## 2020-05-11 NOTE — Progress Notes (Addendum)
Routine Prenatal Care Visit  Subjective  Brittney Gill is a 25 y.o. G3P2002 at [redacted]w[redacted]d being seen today for ongoing prenatal care.  She is currently monitored for the following issues for this high-risk pregnancy and has Anxiety; Morbid obesity (HCC); Depression; Vitamin D deficiency; Supervision of high risk pregnancy, antepartum; Depression with anxiety; Obesity affecting pregnancy; BMI 50.0-59.9, adult (HCC); Leukocytosis; Hyperglycemia; Abnormal finding on antenatal screen; Carrier of genetic disorder; Anemia in pregnancy, third trimester; Abdominal pain in pregnancy, third trimester; Nausea and vomiting during pregnancy; Diarrhea during pregnancy; and Macrosomia affecting management of mother in third trimester on their problem list.  ----------------------------------------------------------------------------------- Patient reports no complaints.  She is "ready" to have this baby. She denies headache, visual changes or epigastric pain. No u/s tech today and unable to have AFI checked. Contractions: Irregular. Vag. Bleeding: None.  Movement: Present. Leaking Fluid denies.  ----------------------------------------------------------------------------------- The following portions of the patient's history were reviewed and updated as appropriate: allergies, current medications, past family history, past medical history, past social history, past surgical history and problem list. Problem list updated.  Objective  Blood pressure (!) 140/92, weight (!) 319 lb (144.7 kg), last menstrual period 08/07/2019 Repeat BP: 144/90 Pregravid weight 303 lb (137.4 kg) Total Weight Gain 16 lb (7.258 kg) Urinalysis: Urine Protein +2 Urine Glucose negative Fetal Status: Fetal Heart Rate (bpm): 160   Movement: Present  Presentation: Vertex   NST: reactive 20 minute tracing, baseline is on the high side of normal at 160 to 165, moderate variability, multiple 15 x 15 accelerations, no decelerations  General:   Alert, oriented and cooperative. Patient is in no acute distress.  Skin: Skin is warm and dry. No rash noted.   Cardiovascular: Normal heart rate noted  Respiratory: Normal respiratory effort, no problems with respiration noted  Abdomen: Soft, gravid, appropriate for gestational age. Pain/Pressure: Present     Pelvic:  Cervical exam performed Dilation: 5 Effacement (%): 60,70 Station: -2, cervix swept  Extremities: Normal range of motion.  Edema: None  Mental Status: Normal mood and affect. Normal behavior. Normal judgment and thought content.   Assessment   25 y.o. [id number] at [redacted]w[redacted]d by  05/22/2020, by Ultrasound presenting for routine prenatal visit  Plan   pregnancy3# Problems (from 08/07/19 to present)    Problem Noted Resolved   Nausea and vomiting during pregnancy 04/06/2020 by Zipporah Plants, CNM No   Diarrhea during pregnancy 04/06/2020 by Zipporah Plants, CNM No   Anemia in pregnancy, third trimester 03/11/2020 by Vena Austria, MD No   Obesity affecting pregnancy 02/10/2018 by Conard Novak, MD No   Overview Addendum 12/31/2019  2:36 PM by Natale Milch, MD    Weight gain recommendation BMI> 30:  11-20 lbs  [x ] Screen of OSA- positive screen, referred for testing  [x]  Early diabetes screening for BMI>30  [x ] Consultation with Anesthesia- patient has upcoming appointment  Antenatal Testing BMI 35-39.9 Weekly at 37 weeks BMI 40 or > Weekly at 34 weeks        Previous Version   Supervision of high risk pregnancy, antepartum 02/02/2018 by 13/01/2018, MD No   Overview Addendum 04/03/2020  1:06 PM by 06/01/2020, CNM     Nursing Staff Provider  Office Location  [facility] Dating   6 wk Mirna Mires  Language  English Anatomy US   incomplete  Flu Vaccine  declined Genetic Screen   normal XX   TDaP vaccine    Hgb A1C or  GTT  Early : hgba1c 5.2 Third trimester : 118  Rhogam   not needed   LAB RESULTS   Feeding Plan  Blood Type B/Positive/-- (08/13 0839)    Contraception BTL, papers[x]  Antibody Negative (08/13 0839)  Circumcision  Rubella 10.60 (08/13 0839)  Pediatrician   RPR Non Reactive (12/17 1145)   Support Person  Dustin HBsAg Negative (08/13 5621)   Prenatal Classes  HIV Non Reactive (12/17 1145)    Varicella Imm  BTL Consent  GBS  (For PCN allergy, check sensitivities)        VBAC Consent n/a Pap  2019 NIL  Covid vaccine  vaccinated Hgb Electro      CF      SMA  carrier     Varicella immune     High risk Pregnancy Diagnoses Obesity in pregnancy- resolved by 32 weeks        Previous Version       Term labor symptoms and general obstetric precautions including but not limited to vaginal bleeding, contractions, leaking of fluid and fetal movement were reviewed in detail with the patient. Please refer to After Visit Summary for other counseling recommendations.   Go to [facility] for PIH eval- L&D and on-call provider notified  Return for IOL on 05/15/20.  [name], CNM 05/11/2020 10:57 AM

## 2020-05-12 LAB — SARS CORONAVIRUS 2 (TAT 6-24 HRS): SARS Coronavirus 2: NEGATIVE

## 2020-05-15 ENCOUNTER — Inpatient Hospital Stay: Payer: Managed Care, Other (non HMO) | Admitting: Anesthesiology

## 2020-05-15 ENCOUNTER — Inpatient Hospital Stay
Admission: EM | Admit: 2020-05-15 | Discharge: 2020-05-16 | DRG: 807 | Disposition: A | Discharge status: Home / Self Care | Payer: Managed Care, Other (non HMO) | Attending: Obstetrics & Gynecology | Admitting: Obstetrics & Gynecology

## 2020-05-15 ENCOUNTER — Encounter: Payer: Self-pay | Admitting: Obstetrics & Gynecology

## 2020-05-15 ENCOUNTER — Other Ambulatory Visit: Payer: Self-pay

## 2020-05-15 DIAGNOSIS — Z87891 Personal history of nicotine dependence: Secondary | ICD-10-CM

## 2020-05-15 DIAGNOSIS — O289 Unspecified abnormal findings on antenatal screening of mother: Secondary | ICD-10-CM | POA: Diagnosis present

## 2020-05-15 DIAGNOSIS — O99214 Obesity complicating childbirth: Secondary | ICD-10-CM | POA: Diagnosis present

## 2020-05-15 DIAGNOSIS — O3663X0 Maternal care for excessive fetal growth, third trimester, not applicable or unspecified: Principal | ICD-10-CM | POA: Diagnosis present

## 2020-05-15 DIAGNOSIS — O0990 Supervision of high risk pregnancy, unspecified, unspecified trimester: Secondary | ICD-10-CM

## 2020-05-15 DIAGNOSIS — O99210 Obesity complicating pregnancy, unspecified trimester: Secondary | ICD-10-CM | POA: Diagnosis present

## 2020-05-15 DIAGNOSIS — O163 Unspecified maternal hypertension, third trimester: Secondary | ICD-10-CM

## 2020-05-15 DIAGNOSIS — O26893 Other specified pregnancy related conditions, third trimester: Secondary | ICD-10-CM | POA: Diagnosis present

## 2020-05-15 DIAGNOSIS — Z20822 Contact with and (suspected) exposure to covid-19: Secondary | ICD-10-CM | POA: Diagnosis present

## 2020-05-15 DIAGNOSIS — O99013 Anemia complicating pregnancy, third trimester: Secondary | ICD-10-CM | POA: Diagnosis present

## 2020-05-15 DIAGNOSIS — Z3A39 39 weeks gestation of pregnancy: Secondary | ICD-10-CM

## 2020-05-15 DIAGNOSIS — F419 Anxiety disorder, unspecified: Secondary | ICD-10-CM | POA: Diagnosis present

## 2020-05-15 DIAGNOSIS — Z3490 Encounter for supervision of normal pregnancy, unspecified, unspecified trimester: Secondary | ICD-10-CM

## 2020-05-15 LAB — COMPREHENSIVE METABOLIC PANEL
ALT: 30 U/L (ref 0–44)
AST: 31 U/L (ref 15–41)
Albumin: 2.8 g/dL — ABNORMAL LOW (ref 3.5–5.0)
Alkaline Phosphatase: 106 U/L (ref 38–126)
Anion gap: 11 (ref 5–15)
BUN: 6 mg/dL (ref 6–20)
CO2: 19 mmol/L — ABNORMAL LOW (ref 22–32)
Calcium: 8.2 mg/dL — ABNORMAL LOW (ref 8.9–10.3)
Chloride: 102 mmol/L (ref 98–111)
Creatinine, Ser: 0.6 mg/dL (ref 0.44–1.00)
GFR, Estimated: >60 mL/min (ref >60)
Glucose, Bld: 145 mg/dL — ABNORMAL HIGH (ref 70–99)
Potassium: 3.3 mmol/L — ABNORMAL LOW (ref 3.5–5.1)
Sodium: 132 mmol/L — ABNORMAL LOW (ref 135–145)
Total Bilirubin: 0.3 mg/dL (ref 0.3–1.2)
Total Protein: 6.5 g/dL (ref 6.5–8.1)

## 2020-05-15 LAB — CBC
HCT: 30 % — ABNORMAL LOW (ref 36.0–46.0)
Hemoglobin: 9.3 g/dL — ABNORMAL LOW (ref 12.0–15.0)
MCH: 24 pg — ABNORMAL LOW (ref 26.0–34.0)
MCHC: 31 g/dL (ref 30.0–36.0)
MCV: 77.3 fL — ABNORMAL LOW (ref 80.0–100.0)
Platelets: 376 10*3/uL (ref 150–400)
RBC: 3.88 MIL/uL (ref 3.87–5.11)
RDW: 14 % (ref 11.5–15.5)
WBC: 9.9 10*3/uL (ref 4.0–10.5)
nRBC: 0 % (ref 0.0–0.2)

## 2020-05-15 LAB — TYPE AND SCREEN
ABO/RH(D): B POS
Antibody Screen: NEGATIVE

## 2020-05-15 LAB — RPR: RPR Ser Ql: NONREACTIVE

## 2020-05-15 LAB — PROTEIN / CREATININE RATIO, URINE
Creatinine, Urine: 163 mg/dL
Protein Creatinine Ratio: 0.15 mg/mg{Cre} (ref 0.00–0.15)
Total Protein, Urine: 24 mg/dL

## 2020-05-15 LAB — RESP PANEL BY RT-PCR (FLU A&B, COVID) ARPGX2
Influenza A by PCR: NEGATIVE
Influenza B by PCR: NEGATIVE
SARS Coronavirus 2 by RT PCR: NEGATIVE

## 2020-05-15 MED ORDER — DIPHENHYDRAMINE HCL 25 MG PO CAPS: 25.0000 mg | ORAL_CAPSULE | Freq: Four times a day (QID) | ORAL | Status: DC | PRN

## 2020-05-15 MED ORDER — ZOLPIDEM TARTRATE 5 MG PO TABS: 5.0000 mg | ORAL_TABLET | Freq: Every evening | ORAL | Status: DC | PRN

## 2020-05-15 MED ORDER — PHENYLEPHRINE 40 MCG/ML (10ML) SYRINGE FOR IV PUSH (FOR BLOOD PRESSURE SUPPORT): 80.0000 ug | PREFILLED_SYRINGE | INTRAVENOUS | Status: DC | PRN

## 2020-05-15 MED ORDER — MISOPROSTOL 200 MCG PO TABS
ORAL_TABLET | ORAL | Status: AC
  Filled 2020-05-15: qty 4

## 2020-05-15 MED ORDER — LIDOCAINE-EPINEPHRINE (PF) 1.5 %-1:200000 IJ SOLN
INTRAMUSCULAR | Status: DC | PRN
  Administered 2020-05-15: 3 mL via EPIDURAL

## 2020-05-15 MED ORDER — FENTANYL 2.5 MCG/ML W/ROPIVACAINE 0.15% IN NS 100 ML EPIDURAL (ARMC)
EPIDURAL | Status: AC
  Administered 2020-05-15: 12 mL/h via EPIDURAL
  Filled 2020-05-15: qty 100

## 2020-05-15 MED ORDER — EPHEDRINE 5 MG/ML INJ: 10.0000 mg | INTRAVENOUS | Status: DC | PRN

## 2020-05-15 MED ORDER — OXYTOCIN BOLUS FROM INFUSION
333.0000 mL | Freq: Once | INTRAVENOUS | Status: AC
  Administered 2020-05-15: 333 mL via INTRAVENOUS

## 2020-05-15 MED ORDER — SENNOSIDES-DOCUSATE SODIUM 8.6-50 MG PO TABS
2.0000 | ORAL_TABLET | Freq: Every day | ORAL | Status: DC
  Administered 2020-05-16: 2 via ORAL
  Filled 2020-05-15: qty 2

## 2020-05-15 MED ORDER — BUPIVACAINE HCL (PF) 0.25 % IJ SOLN
INTRAMUSCULAR | Status: DC | PRN
  Administered 2020-05-15: 4 mL via EPIDURAL

## 2020-05-15 MED ORDER — OXYTOCIN-SODIUM CHLORIDE 30-0.9 UT/500ML-% IV SOLN
1.0000 m[IU]/min | INTRAVENOUS | Status: DC
  Administered 2020-05-15: 2 m[IU]/min via INTRAVENOUS

## 2020-05-15 MED ORDER — FENTANYL 2.5 MCG/ML W/ROPIVACAINE 0.15% IN NS 100 ML EPIDURAL (ARMC)
12.0000 mL/h | EPIDURAL | Status: DC
  Administered 2020-05-15: 12 mL/h via EPIDURAL
  Filled 2020-05-15: qty 100

## 2020-05-15 MED ORDER — IBUPROFEN 600 MG PO TABS
600.0000 mg | ORAL_TABLET | Freq: Four times a day (QID) | ORAL | Status: DC
  Administered 2020-05-15 – 2020-05-16 (×4): 600 mg via ORAL
  Filled 2020-05-15 (×4): qty 1

## 2020-05-15 MED ORDER — OXYTOCIN-SODIUM CHLORIDE 30-0.9 UT/500ML-% IV SOLN
2.5000 [IU]/h | INTRAVENOUS | Status: DC
  Administered 2020-05-15 (×2): 2.5 [IU]/h via INTRAVENOUS
  Filled 2020-05-15 (×2): qty 500

## 2020-05-15 MED ORDER — TETANUS-DIPHTH-ACELL PERTUSSIS 5-2.5-18.5 LF-MCG/0.5 IM SUSY
0.5000 mL | PREFILLED_SYRINGE | Freq: Once | INTRAMUSCULAR | Status: DC
  Filled 2020-05-15: qty 0.5

## 2020-05-15 MED ORDER — PRENATAL MULTIVITAMIN CH
1.0000 | ORAL_TABLET | Freq: Every day | ORAL | Status: DC
  Administered 2020-05-16: 1 via ORAL
  Filled 2020-05-15: qty 1

## 2020-05-15 MED ORDER — ONDANSETRON HCL 4 MG/2ML IJ SOLN: 4.0000 mg | Freq: Four times a day (QID) | INTRAMUSCULAR | Status: DC | PRN

## 2020-05-15 MED ORDER — TERBUTALINE SULFATE 1 MG/ML IJ SOLN: 0.2500 mg | Freq: Once | INTRAMUSCULAR | Status: DC | PRN

## 2020-05-15 MED ORDER — COCONUT OIL OIL: 1.0000 "application " | TOPICAL_OIL | Status: DC | PRN

## 2020-05-15 MED ORDER — BENZOCAINE-MENTHOL 20-0.5 % EX AERO: 1.0000 "application " | INHALATION_SPRAY | CUTANEOUS | Status: DC | PRN

## 2020-05-15 MED ORDER — ONDANSETRON HCL 4 MG PO TABS
4.0000 mg | ORAL_TABLET | ORAL | Status: DC | PRN
  Filled 2020-05-15: qty 1

## 2020-05-15 MED ORDER — SIMETHICONE 80 MG PO CHEW: 80.0000 mg | CHEWABLE_TABLET | ORAL | Status: DC | PRN

## 2020-05-15 MED ORDER — LIDOCAINE HCL (PF) 1 % IJ SOLN
INTRAMUSCULAR | Status: DC | PRN
  Administered 2020-05-15: 3 mL

## 2020-05-15 MED ORDER — ACETAMINOPHEN 325 MG PO TABS: 650.0000 mg | ORAL_TABLET | ORAL | Status: DC | PRN

## 2020-05-15 MED ORDER — WITCH HAZEL-GLYCERIN EX PADS: 1.0000 "application " | MEDICATED_PAD | CUTANEOUS | Status: DC | PRN

## 2020-05-15 MED ORDER — ONDANSETRON HCL 4 MG/2ML IJ SOLN: 4.0000 mg | INTRAMUSCULAR | Status: DC | PRN

## 2020-05-15 MED ORDER — DIBUCAINE (PERIANAL) 1 % EX OINT: 1.0000 "application " | TOPICAL_OINTMENT | CUTANEOUS | Status: DC | PRN

## 2020-05-15 MED ORDER — LACTATED RINGERS IV SOLN
500.0000 mL | INTRAVENOUS | Status: DC | PRN
  Administered 2020-05-15: 500 mL via INTRAVENOUS

## 2020-05-15 MED ORDER — MISOPROSTOL 25 MCG QUARTER TABLET: 25.0000 ug | ORAL_TABLET | ORAL | Status: DC | PRN

## 2020-05-15 MED ORDER — LACTATED RINGERS IV SOLN
500.0000 mL | Freq: Once | INTRAVENOUS | Status: AC
  Administered 2020-05-15: 500 mL via INTRAVENOUS

## 2020-05-15 MED ORDER — DIPHENHYDRAMINE HCL 50 MG/ML IJ SOLN
12.5000 mg | INTRAMUSCULAR | Status: DC | PRN
Start: 2020-05-15 — End: 2020-05-15

## 2020-05-15 MED ORDER — LIDOCAINE HCL (PF) 1 % IJ SOLN
30.0000 mL | INTRAMUSCULAR | Status: DC | PRN
  Filled 2020-05-15: qty 30

## 2020-05-15 MED ORDER — LACTATED RINGERS IV SOLN: INTRAVENOUS | Status: DC

## 2020-05-15 MED ORDER — BUTORPHANOL TARTRATE 1 MG/ML IJ SOLN
1.0000 mg | INTRAMUSCULAR | Status: DC | PRN
Start: 2020-05-15 — End: 2020-05-15

## 2020-05-15 NOTE — H&P (Signed)
History and Physical  Brittney Gill is a 25 y.o. [id number] [redacted]w[redacted]d who has presented for IOL due to Favorable cervix at term . Pregnancy course has been complicated by maternal morbid obesity (pregravid BMI >50), anemia, abnormal finding on antenatal screen (+SMA carrier), macrosomia (growth is >90%tile, AC >97.7%tile on 04/28/20 [place]), and anxiety/depression.  Patient denies pain, bleeding, ruptured membranes, or other signs of progressing labor at this time.  PMHx: She  has a past medical history of Anxiety, Depression, and Pregnancy occurring while using intrauterine contraceptive device (IUD) (02/02/2018). Also,  has a past surgical history that includes Wisdom tooth extraction and Fracture surgery., family history is not on file.,  reports that she quit smoking about 2 years ago. Her smoking use included cigarettes. She started smoking about 4 years ago. She smoked 0.50 packs per day. She has never used smokeless tobacco. She reports that she does not drink alcohol and does not use drugs. She  No current facility-administered medications on file prior to encounter.   Current Outpatient Medications on File Prior to Encounter  Medication Sig Dispense Refill  . famotidine (PEPCID) 20 MG tablet Take 20 mg by mouth 2 (two) times daily.    . ferrous sulfate (FERROUSUL) 325 (65 FE) MG tablet Take 1 tablet (325 mg total) by mouth daily with breakfast. 30 tablet 3  . Prenatal Vit-Fe Fumarate-FA (MULTIVITAMIN-PRENATAL) 27-0.8 MG TABS tablet Take 1 tablet by mouth daily at 12 noon.     Also, is allergic to other and diflucan [fluconazole]. OB History  Gravida Para Term Preterm AB Living  3 2 2     2   SAB IAB Ectopic Multiple Live Births        0 2    # Outcome Date GA Lbr Len/2nd Weight Sex Delivery Anes PTL Lv  3 Current           2 Term 09/22/18 [redacted]w[redacted]d 06:40 / 01:33 3780 g F Vag-Spont EPI  LIV  1 Term 01/25/16 [redacted]w[redacted]d  3175 g M Vag-Spont  N LIV   Patient denies any other pertinent gynecologic  issues.   Review of Systems  Constitutional: Negative for chills and fever.  HENT: Negative.   Eyes: Negative.   Respiratory: Negative.   Cardiovascular: Negative.   Gastrointestinal: Positive for abdominal pain.       Occasional contractions  Genitourinary: Negative.   Musculoskeletal: Negative.   Skin: Negative.   Neurological: Negative.   Endo/Heme/Allergies: Negative.   Psychiatric/Behavioral: Negative.     Objective: BP 123/63 (BP Location: Left Arm)   Pulse 92   Temp 98.1 F (36.7 C) (Oral)   Resp 17   Ht 5\' 5"  (1.651 m)   Wt (!) 145.2 kg   LMP 08/07/2019   BMI 53.25 kg/m  Physical Exam Constitutional:      General: She is not in acute distress.    Appearance: Normal appearance. She is obese. She is not toxic-appearing.  Genitourinary:     Genitourinary Comments: Deferred  HENT:     Head: Normocephalic.     Mouth/Throat:     Mouth: Mucous membranes are moist.     Pharynx: Oropharynx is clear.  Eyes:     Pupils: Pupils are equal, round, and reactive to light.  Cardiovascular:     Rate and Rhythm: Normal rate and regular rhythm.     Pulses: Normal pulses.  Pulmonary:     Effort: Pulmonary effort is normal.     Breath sounds: Normal breath sounds.  Abdominal:  Comments: Obese, gravid (size c/w dates)  Musculoskeletal:        General: Normal range of motion.     Cervical back: Normal range of motion.  Neurological:     Mental Status: She is alert and oriented to person, place, and time.  Skin:    General: Skin is warm and dry.     Capillary Refill: Capillary refill takes less than 2 seconds.  Psychiatric:        Mood and Affect: Mood normal.        Behavior: Behavior normal.     Assessment: Term Pregnancy for Induction of Labor due to Favorable cervix at term.  Plan: Patient will undergo induction of labor with pitocin.     Patient has been fully informed of the pros and cons, risks and benefits of continued observation with fetal monitoring  versus that of induction of labor.   She understands that there are uncommon risks to induction, which include but are not limited to : frequent or prolonged uterine contractions, fetal distress, uterine rupture, and lack of successful induction.  These risks include all methods including Pitocin and Misoprostol.  Patient understands that using Misoprostol for labor induction is an "off label" indication although it has been studied extensively for this purpose and is an accepted method of induction.  She also has been informed of the increased risks for Cesarean with induction and should induction not be successful.  Patient consents to the induction plan of management.  Plans to breast feed Plans bilateral tubal ligation for contraception TDaP UTD 03/23/20  Zipporah Plants, CNM, MSN [facility], [facility] 05/15/2020  7:47 AM

## 2020-05-15 NOTE — Anesthesia Procedure Notes (Signed)
Epidural Patient location during procedure: OB Start time: 05/15/2020 9:15 AM End time: 05/15/2020 9:35 AM  Staffing Anesthesiologist: Karleen Hampshire, MD Resident/CRNA: Jeanine Luz, CRNA Performed: resident/CRNA   Preanesthetic Checklist Completed: patient identified, IV checked, site marked, risks and benefits discussed, surgical consent, monitors and equipment checked and pre-op evaluation  Epidural Patient position: sitting Prep: ChloraPrep Patient monitoring: heart rate, continuous pulse ox and blood pressure Approach: midline Location: L3-L4 Injection technique: LOR saline  Needle:  Needle type: Tuohy  Needle gauge: 17 G Needle length: 9 cm and 9 Needle insertion depth: 9 cm Catheter type: closed end flexible Catheter size: 19 Gauge Catheter at skin depth: 14 cm Test dose: negative and 1.5% lidocaine with Epi 1:200 K  Assessment Events: blood not aspirated, injection not painful, no injection resistance, no paresthesia and negative IV test  Additional Notes 1 attempt Pt. Evaluated and documentation done after procedure finished. Patient identified. Risks/Benefits/Options discussed with patient including but not limited to bleeding, infection, nerve damage, paralysis, failed block, incomplete pain control, headache, blood pressure changes, nausea, vomiting, reactions to medication both or allergic, itching and postpartum back pain. Confirmed with bedside nurse the patient's most recent platelet count. Confirmed with patient that they are not currently taking any anticoagulation, have any bleeding history or any family history of bleeding disorders. Patient expressed understanding and wished to proceed. All questions were answered. Sterile technique was used throughout the entire procedure. Please see nursing notes for vital signs. Test dose was given through epidural catheter and negative prior to continuing to dose epidural or start infusion. Warning signs of high  block given to the patient including shortness of breath, tingling/numbness in hands, complete motor block, or any concerning symptoms with instructions to call for help. Patient was given instructions on fall risk and not to get out of bed. All questions and concerns addressed with instructions to call with any issues or inadequate analgesia.   Patient tolerated the insertion well without immediate complications.Reason for block:procedure for pain

## 2020-05-15 NOTE — Discharge Summary (Signed)
OB Discharge Summary     Patient Name: Brittney Gill DOB: [birth year] MRN: [id number]  Date of admission: 05/15/2020 Delivering provider: [name], CNM  Date of Delivery: 05/15/2020  Date of discharge: 05/16/2020  Admitting diagnosis: Macrosomia [P08.0], elective induction of labor Intrauterine pregnancy: [redacted]w[redacted]d     Secondary diagnosis: None     Discharge diagnosis: Term Pregnancy Delivered                                                                                                Post partum procedures:none  Augmentation: AROM and Pitocin  Complications: None  Hospital course:  Induction of Labor With Vaginal Delivery   25 y.o. yo G3P2002 at [redacted]w[redacted]d was admitted to the hospital 05/15/2020 for induction of labor.  Indication for induction: Favorable cervix at term.  Patient had an uncomplicated labor course as follows: Membrane Rupture Time/Date: 2:50 PM ,05/15/2020   Delivery Method:Vaginal, Spontaneous  Episiotomy: None  Lacerations:  1st degree  Details of delivery can be found in separate delivery note.  Patient had a routine postpartum course. Patient is discharged home 05/16/20.  She had a single elevated blood pressure on day of discharge. She denied headaches, visual changes, and RUQ pain.  Subsequent BP check normal.  Discharged with precautions for preeclampsia symptoms and will have early follow up for BP check and mood check with significant history of postpartum depression.   Newborn Data: Birth date:05/15/2020  Birth time:4:16 PM  Gender:Female  Living status:Living  Apgars: 8, 9 Weight: 4640 g, 10 pounds 4 ounces  Physical exam  Vitals:   05/16/20 0426 05/16/20 0758 05/16/20 1127 05/16/20 1417  BP: (!) 111/56 124/80 (!) 145/89 121/75  Pulse: 66 73 82 74  Resp:  20 20 18   Temp:  98.3 F (36.8 C) 98.3 F (36.8 C) 98.1 F (36.7 C)  TempSrc:  Oral Oral Oral  SpO2:  100%  100%  Weight:      Height:       General: alert, cooperative and no  distress Lochia: appropriate Uterine Fundus: firm Incision: N/A DVT Evaluation: No evidence of DVT seen on physical exam. No cords or calf tenderness. No significant calf/ankle edema.  Labs: Lab Results  Component Value Date   WBC 13.1 (H) 05/16/2020   HGB 9.1 (L) 05/16/2020   HCT 28.1 (L) 05/16/2020   MCV 76.4 (L) 05/16/2020   PLT 320 05/16/2020    Discharge instruction: per After Visit Summary.  Medications:  Allergies as of 05/16/2020      Reactions   Other Itching   Vitamin E cream   Diflucan [fluconazole] Rash      Medication List    TAKE these medications   acetaminophen 500 MG tablet Commonly known as: TYLENOL Take 2 tablets (1,000 mg total) by mouth every 8 (eight) hours as needed (for pain scale < 4).   famotidine 20 MG tablet Commonly known as: PEPCID Take 20 mg by mouth 2 (two) times daily.   ferrous sulfate 325 (65 FE) MG tablet Commonly known as: FerrouSul Take 1 tablet (325 mg total) by mouth daily with breakfast.  ibuprofen 600 MG tablet Commonly known as: ADVIL Take 1 tablet (600 mg total) by mouth every 6 (six) hours.   multivitamin-prenatal 27-0.8 MG Tabs tablet Take 1 tablet by mouth daily at 12 noon.            Discharge Care Instructions  (From admission, onward)         Start     Ordered   05/16/20 0000  Discharge wound care:       Comments: Perform wound care instructions   05/16/20 1420          Diet: routine diet  Activity: Advance as tolerated. Pelvic rest for 6 weeks.   Outpatient follow up:  Follow-up Information    [name], CNM. Schedule an appointment as soon as possible for a visit in 1 week(s).   Specialty: Obstetrics Why: BP and mood check Contact information: [address] [place] [address] [phone]                 Postpartum contraception: Vasectomy Rhogam Given postpartum: NA Rubella vaccine given postpartum: Immune Varicella vaccine given postpartum: Immune TDaP given  antepartum or postpartum: given antepartum  Newborn Delivery   Birth date/time: 05/15/2020 16:16:00 Delivery type: Vaginal, Spontaneous       Baby Feeding: Breast  Disposition:home with mother  SIGNED:  Thomasene Mohair, MD 05/16/2020 2:20 PM

## 2020-05-15 NOTE — Anesthesia Preprocedure Evaluation (Addendum)
Anesthesia Evaluation  Patient identified by MRN, date of birth, ID band Patient awake    Reviewed: Allergy & Precautions, H&P , NPO status , Patient's Chart, lab work & pertinent test results  Airway Mallampati: II  TM Distance: >3 FB Neck ROM: full    Dental no notable dental hx.    Pulmonary neg pulmonary ROS, former smoker,    Pulmonary exam normal        Cardiovascular negative cardio ROS Normal cardiovascular exam     Neuro/Psych PSYCHIATRIC DISORDERS Anxiety Depression negative neurological ROS     GI/Hepatic negative GI ROS, Neg liver ROS,   Endo/Other  Morbid obesity  Renal/GU negative Renal ROS  negative genitourinary   Musculoskeletal   Abdominal   Peds  Hematology  (+) Blood dyscrasia, anemia ,   Anesthesia Other Findings   Reproductive/Obstetrics (+) Pregnancy                            Anesthesia Physical Anesthesia Plan  ASA: III  Anesthesia Plan:    Post-op Pain Management:    Induction:   PONV Risk Score and Plan:   Airway Management Planned:   Additional Equipment:   Intra-op Plan:   Post-operative Plan:   Informed Consent:   Plan Discussed with:   Anesthesia Plan Comments:         Anesthesia Quick Evaluation

## 2020-05-15 NOTE — Progress Notes (Signed)
  Labor Progress Note   25 y.o. [id number] @ [redacted]w[redacted]d , admitted for  Pregnancy, Labor Management.   Subjective:  Comfortable with epidural  Objective:  BP 123/68   Pulse 81   Temp 98.1 F (36.7 C) (Oral)   Resp 17   Ht 5\' 5"  (1.651 m)   Wt (!) 145.2 kg   LMP 08/07/2019   SpO2 99%   BMI 53.25 kg/m  Abd: gravid, ND, FHT present, mild tenderness on exam Extr: trace to 1+ bilateral pedal edema SVE: CERVIX: 6 cm dilated, 70, 80 effaced, -2 station  EFM: FHR: 145 bpm, variability: moderate,  accelerations:  Present,  decelerations:  Absent Toco: Frequency: Every 2-4 minutes Labs: I have reviewed the patient's lab results.   Assessment & Plan:  08/09/2019 @ [redacted]w[redacted]d, admitted for  Pregnancy and Labor/Delivery Management  1. Pain management: epidural. 2. FWB: FHT category I.  3. ID: GBS negative 4. Labor management: continue titrating pitocin, consider AROM  All discussed with patient, see orders   [redacted]w[redacted]d, CNM [facility] 05/15/2020  11:18 AM

## 2020-05-15 NOTE — Lactation Note (Signed)
This note was copied from a baby's chart. Lactation Consultation Note  Patient Name: Brittney Gill [id number]'T Date: 05/15/2020 Reason for consult: L&D Initial assessment;Difficult latch Age:26 hours  Lactation to the L&D room for initial visit. Mother is holding the baby to the breast on the right in football.  Encouraged feeding on demand and with cues. If baby is not cueing encouraged hand expression and skin to skin. Taught proper technique for hand expression.  LC assisted with latch in football on the right. Nipples retract with compression. Mother brought her nipple shield, 24 mm and nipple extractor with her. She had difficulty BF her 2nd child due to pain and damage in her nipple. LC asked about hx of tongue tie, noted a lingual frenulum when baby was crying, baby clicking with feed at breast and cheek dimpling. Baby opens wide and latches deep, but nipple compressed after feed. Breast are very pliable. Encouraged 8 or more attempts in the first 24 hours. Mother has no further questions at this time.   Maternal Data Has patient been taught Hand Expression?: Yes Does the patient have breastfeeding experience prior to this delivery?: Yes How long did the patient breastfeed?: 1 month with 1st, 2 days with 2nd child  Feeding Mother's Current Feeding Choice: Breast Milk  LATCH Score Latch: Repeated attempts needed to sustain latch, nipple held in mouth throughout feeding, stimulation needed to elicit sucking reflex.  Audible Swallowing: A few with stimulation  Type of Nipple: Everted at rest and after stimulation  Comfort (Breast/Nipple): Filling, red/small blisters or bruises, mild/mod discomfort (nipple compressed)  Hold (Positioning): Assistance needed to correctly position infant at breast and maintain latch.  LATCH Score: 6   Lactation Tools Discussed/Used    Interventions Interventions: Position options;Support pillows;Adjust position;Breast compression;Breast  massage;Hand express;Assisted with latch;Breast feeding basics reviewed  Discharge Pump: Employee Pump (Stepfather works for [employer], on his insurance)  Consult Status Consult Status: Follow-up Date: 05/16/20 Follow-up type: Call as needed    Breylan Lefevers D Deeana Atwater 05/15/2020, 5:42 PM

## 2020-05-16 ENCOUNTER — Other Ambulatory Visit: Payer: Self-pay | Admitting: Obstetrics and Gynecology

## 2020-05-16 DIAGNOSIS — Z3A39 39 weeks gestation of pregnancy: Secondary | ICD-10-CM

## 2020-05-16 LAB — CBC
HCT: 28.1 % — ABNORMAL LOW (ref 36.0–46.0)
Hemoglobin: 9.1 g/dL — ABNORMAL LOW (ref 12.0–15.0)
MCH: 24.7 pg — ABNORMAL LOW (ref 26.0–34.0)
MCHC: 32.4 g/dL (ref 30.0–36.0)
MCV: 76.4 fL — ABNORMAL LOW (ref 80.0–100.0)
Platelets: 320 10*3/uL (ref 150–400)
RBC: 3.68 MIL/uL — ABNORMAL LOW (ref 3.87–5.11)
RDW: 13.9 % (ref 11.5–15.5)
WBC: 13.1 10*3/uL — ABNORMAL HIGH (ref 4.0–10.5)
nRBC: 0 % (ref 0.0–0.2)

## 2020-05-16 MED ORDER — IBUPROFEN 600 MG PO TABS: 600.0000 mg | ORAL_TABLET | Freq: Four times a day (QID) | ORAL | 0 refills | Status: DC

## 2020-05-16 MED ORDER — ACETAMINOPHEN 500 MG PO TABS: 1000.0000 mg | ORAL_TABLET | Freq: Three times a day (TID) | ORAL | Status: DC | PRN

## 2020-05-16 NOTE — Discharge Instructions (Signed)

## 2020-05-16 NOTE — Progress Notes (Signed)
Mother discharged.  Discharge instructions given.  Mother verbalizes understanding.  Transported by auxiliary.  

## 2020-05-16 NOTE — Lactation Note (Signed)
This note was copied from a baby's chart. Lactation Consultation Note  Patient Name: Brittney Gill QQPYP'P Date: 05/16/2020 Reason for consult: Follow-up assessment;Term;Nipple pain/trauma (nipple shield) Age:26 hours  Lactation student led conversation. This is mom's third baby. She has been exclusively BF but not using a nipple shield or nipple everter that she brought from home. Parents report feedings to be going well, no questions/discomfort at this time. Mom's step-dad is a [employer], and mom is on her insurance; Medela pump provided. [facility] student provided education on what to expect in the days/weeks with breastfeeding, encouraged to track feedings and output, provided signs of fullness, successful latch/transfer, breast changes and nipple care.  Information for outpatient lactation services given as well as community breastfeeding resources. Encouraged to call with any questions or for ongoing BF support.  Maternal Data Has patient been taught Hand Expression?: Yes  Feeding Mother's Current Feeding Choice: Breast Milk  LATCH Score                    Lactation Tools Discussed/Used Tools: Pump Breast pump type: Double-Electric Breast Pump Reason for Pumping: mom's choice  Interventions Interventions: Breast feeding basics reviewed;DEBP;Education  Discharge Discharge Education: Engorgement and breast care;Warning signs for feeding baby;Outpatient recommendation Pump: DEBP;Employee Pump (Step-dad is [employer] Employee)  Consult Status Consult Status: Complete Date: 05/16/20 Follow-up type: Call as needed    Danford Bad 05/16/2020, 4:49 PM

## 2020-05-16 NOTE — Anesthesia Postprocedure Evaluation (Signed)
Anesthesia Post Note  Patient: Brittney Gill  Procedure(s) Performed: AN AD HOC LABOR EPIDURAL  Patient location during evaluation: Mother Baby Anesthesia Type: Spinal Level of consciousness: oriented and awake and alert Pain management: pain level controlled Vital Signs Assessment: post-procedure vital signs reviewed and stable Respiratory status: spontaneous breathing and respiratory function stable Cardiovascular status: blood pressure returned to baseline and stable Postop Assessment: no headache, no backache, no apparent nausea or vomiting and able to ambulate Anesthetic complications: no   No complications documented.   Last Vitals:  Vitals:   05/16/20 0426 05/16/20 0758  BP: (!) 111/56 124/80  Pulse: 66 73  Resp:  20  Temp:  36.8 C  SpO2:  100%    Last Pain:  Vitals:   05/16/20 0758  TempSrc: Oral  PainSc:                  [name]

## 2020-05-16 NOTE — Anesthesia Postprocedure Evaluation (Deleted)
Anesthesia Post Note  Patient: Brittney Gill  Procedure(s) Performed: AN AD HOC LABOR EPIDURAL  Anesthetic complications: no   No complications documented.   Last Vitals:  Vitals:   05/16/20 0426 05/16/20 0758  BP: (!) 111/56 124/80  Pulse: 66 73  Resp:  20  Temp:  36.8 C  SpO2:  100%    Last Pain:  Vitals:   05/16/20 0758  TempSrc: Oral  PainSc:                  [name]

## 2020-06-30 ENCOUNTER — Encounter: Payer: Self-pay | Admitting: Advanced Practice Midwife

## 2020-06-30 ENCOUNTER — Other Ambulatory Visit: Payer: Self-pay

## 2020-06-30 ENCOUNTER — Ambulatory Visit (INDEPENDENT_AMBULATORY_CARE_PROVIDER_SITE_OTHER): Payer: Managed Care, Other (non HMO) | Admitting: Advanced Practice Midwife

## 2020-06-30 DIAGNOSIS — F418 Other specified anxiety disorders: Secondary | ICD-10-CM

## 2020-06-30 MED ORDER — BUPROPION HCL ER (XL) 150 MG PO TB24: 150.0000 mg | ORAL_TABLET | Freq: Every day | ORAL | 11 refills | Status: DC

## 2020-06-30 MED ORDER — SERTRALINE HCL 50 MG PO TABS: 50.0000 mg | ORAL_TABLET | Freq: Every day | ORAL | 11 refills | Status: DC

## 2020-06-30 NOTE — Progress Notes (Signed)
Postpartum Visit  Chief Complaint:  Chief Complaint  Patient presents with  . Postpartum Care    History of Present Illness: Patient is a 25 y.o. [id number] presents for postpartum visit.  Review the Delivery Report for details.  Date of delivery: 05/15/2020 Type of delivery: Vaginal delivery - Vacuum or forceps assisted  no Episiotomy No.  Laceration: 2nd degree with repair  Pregnancy or labor problems:  Obesity with BMI greater than 50 Any problems since the delivery:  no  Newborn Details:  SINGLETON :  1. BabyGender female. Birth weight: 10 pounds 4 ounces Maternal Details:  Breast or formula feeding: breastfeeding Intercourse: Yes  Contraception after delivery: vasectomy Any bowel or bladder issues: No  Post partum depression/anxiety noted:  no Edinburgh Post-Partum Depression Score: 5 Date of last PAP: 2019  no abnormalities   Review of Systems: Review of Systems  Constitutional: Negative for chills and fever.  HENT: Negative for congestion, ear discharge, ear pain, hearing loss, sinus pain and sore throat.   Eyes: Negative for blurred vision and double vision.  Respiratory: Negative for cough, shortness of breath and wheezing.   Cardiovascular: Negative for chest pain, palpitations and leg swelling.  Gastrointestinal: Negative for abdominal pain, blood in stool, constipation, diarrhea, heartburn, melena, nausea and vomiting.  Genitourinary: Negative for dysuria, flank pain, frequency, hematuria and urgency.  Musculoskeletal: Negative for back pain, joint pain and myalgias.  Skin: Negative for itching and rash.  Neurological: Negative for dizziness, tingling, tremors, sensory change, speech change, focal weakness, seizures, loss of consciousness, weakness and headaches.  Endo/Heme/Allergies: Negative for environmental allergies. Does not bruise/bleed easily.  Psychiatric/Behavioral: Negative for depression, hallucinations, memory loss, substance abuse and suicidal  ideas. The patient is not nervous/anxious and does not have insomnia.     Past Medical History:  Past Medical History:  Diagnosis Date  . Anxiety   . Depression   . Pregnancy occurring while using intrauterine contraceptive device (IUD) 02/02/2018    Past Surgical History:  Past Surgical History:  Procedure Laterality Date  . FRACTURE SURGERY     Left  . WISDOM TOOTH EXTRACTION      Family History:  History reviewed. No pertinent family history.  Social History:  Social History   Socioeconomic History  . Marital status: Married    Spouse name: Not on file  . Number of children: 2  . Years of education: Not on file  . Highest education level: High school graduate  Occupational History  . Not on file  Tobacco Use  . Smoking status: Former Smoker    Packs/day: 0.50    Types: Cigarettes    Start date: 04/01/2016    Quit date: 12/23/2017    Years since quitting: 2.5  . Smokeless tobacco: Never Used  . Tobacco comment: Had quit in 2017, started back in 2018 but quit when she found out she was pregnant.  Vaping Use  . Vaping Use: Never used  Substance and Sexual Activity  . Alcohol use: No    Comment: social-not often  . Drug use: No  . Sexual activity: Yes    Birth control/protection: Surgical    Comment: vasectomy  Other Topics Concern  . Not on file  Social History Narrative  . Not on file   Social Determinants of Health   Financial Resource Strain: Not on file  Food Insecurity: Not on file  Transportation Needs: Not on file  Physical Activity: Not on file  Stress: Not on file  Social  Connections: Not on file  Intimate Partner Violence: Not on file    Allergies:  Allergies  Allergen Reactions  . Other Itching    Vitamin E cream  . Diflucan [Fluconazole] Rash    Medications: Prior to Admission medications   Medication Sig Start Date End Date Taking? Authorizing Provider  buPROPion (WELLBUTRIN XL) 150 MG 24 hr tablet Take 1 tablet (150 mg total) by  mouth daily. 06/30/20  Yes [name], CNM  sertraline (ZOLOFT) 50 MG tablet Take 1 tablet (50 mg total) by mouth daily. 06/30/20   [name], CNM    Physical Exam Blood pressure 102/70, height 5\' 5"  (1.651 m), weight 283 lb (128.4 kg) currently breastfeeding.    General: NAD HEENT: normocephalic, anicteric Pulmonary: No increased work of breathing Abdomen: NABS, soft, non-tender, non-distended.  Umbilicus without lesions.  No hepatomegaly, splenomegaly or masses palpable. No evidence of hernia. Genitourinary:  External: Normal external female genitalia.  Normal urethral meatus, normal Bartholin's and Skene's glands.    Vagina: Normal vaginal mucosa, no evidence of prolapse.    Cervix: Grossly normal in appearance, no bleeding, no CMT  Uterus: Non-enlarged, mobile, normal contour.    Adnexa: ovaries non-enlarged, no adnexal masses  Rectal: deferred Extremities: no edema, erythema, or tenderness Neurologic: Grossly intact Psychiatric: mood appropriate, affect full   Edinburgh Postnatal Depression Scale - 06/30/20 1550      Edinburgh Postnatal Depression Scale:  In the Past 7 Days   I have been able to laugh and see the funny side of things. 0    I have looked forward with enjoyment to things. 0    I have blamed myself unnecessarily when things went wrong. 2    I have been anxious or worried for no good reason. 1    I have felt scared or panicky for no good reason. 1    Things have been getting on top of me. 0    I have been so unhappy that I have had difficulty sleeping. 0    I have felt sad or miserable. 1    I have been so unhappy that I have been crying. 0    The thought of harming myself has occurred to me. 0    Edinburgh Postnatal Depression Scale Total 5           Assessment: 25 y.o. [phone] presenting for 6 week postpartum visit  Plan: Problem List Items Addressed This Visit      Other   Depression with anxiety   Relevant Medications   sertraline (ZOLOFT)  50 MG tablet   buPROPion (WELLBUTRIN XL) 150 MG 24 hr tablet   Postpartum care following vaginal delivery - Primary   Relevant Medications   sertraline (ZOLOFT) 50 MG tablet   buPROPion (WELLBUTRIN XL) 150 MG 24 hr tablet       1) Contraception - Education given regarding options for contraception, as well as compatibility with breast feeding if applicable.  Patient plans on vasectomy for contraception. She also wants to schedule a tubal ligation.  2)  Pap - ASCCP guidelines and rationale discussed.  ASCCP guidelines and rational discussed.  Patient opts for every 3 years screening interval: due at next annual visit  3) Patient underwent screening for postpartum depression with no signs of depression  4) Return in about 1 year (around 06/30/2021) for annual established gyn and schedule with MD for tubal planning.   08/30/2021, CNM [facility] 06/30/2020, 5:05 PM

## 2020-07-06 ENCOUNTER — Other Ambulatory Visit: Payer: Self-pay

## 2020-07-06 ENCOUNTER — Encounter: Payer: Self-pay | Admitting: Obstetrics and Gynecology

## 2020-07-06 ENCOUNTER — Ambulatory Visit (INDEPENDENT_AMBULATORY_CARE_PROVIDER_SITE_OTHER): Payer: Managed Care, Other (non HMO) | Admitting: Obstetrics and Gynecology

## 2020-07-06 VITALS — BP 126/74 | Wt 282.0 lb

## 2020-07-06 DIAGNOSIS — Z3009 Encounter for other general counseling and advice on contraception: Secondary | ICD-10-CM | POA: Diagnosis not present

## 2020-07-06 NOTE — Progress Notes (Signed)
Obstetrics & Gynecology Office Visit   Chief Complaint  Patient presents with  . Consult    Surgery consult for BTL   History of Present Illness: 25 y.o. G42P3003 female who presents to discuss bilateral tubal ligation.    Patient is a 25 y.o. [id number] presenting for contraception consult.  She is currently using vasectomy and desiring to undergo BTL.  She has a past medical history significant for no contraindication to estrogen.  She specifically denies a history of migraine with aura, chronic hypertension, history of DVT/PE and smoking.  Reported No LMP recorded..    Past Medical History:  Diagnosis Date  . Anxiety   . Depression   . Pregnancy occurring while using intrauterine contraceptive device (IUD) 02/02/2018    Past Surgical History:  Procedure Laterality Date  . FRACTURE SURGERY     Left  . WISDOM TOOTH EXTRACTION      Gynecologic History: No LMP recorded.  Obstetric History: K1S0109  Family History  Problem Relation Age of Onset  . Breast cancer Neg Hx   . Cancer Neg Hx   . Ovarian cancer Neg Hx     Social History   Socioeconomic History  . Marital status: Married    Spouse name: Not on file  . Number of children: 2  . Years of education: Not on file  . Highest education level: High school graduate  Occupational History  . Not on file  Tobacco Use  . Smoking status: Former Smoker    Packs/day: 0.50    Types: Cigarettes    Start date: 04/01/2016    Quit date: 12/23/2017    Years since quitting: 2.5  . Smokeless tobacco: Never Used  . Tobacco comment: Had quit in 2017, started back in 2018 but quit when she found out she was pregnant.  Vaping Use  . Vaping Use: Never used  Substance and Sexual Activity  . Alcohol use: No    Comment: social-not often  . Drug use: No  . Sexual activity: Yes    Birth control/protection: Surgical    Comment: vasectomy  Other Topics Concern  . Not on file  Social History Narrative  . Not on file   Social  Determinants of Health   Financial Resource Strain: Not on file  Food Insecurity: Not on file  Transportation Needs: Not on file  Physical Activity: Not on file  Stress: Not on file  Social Connections: Not on file  Intimate Partner Violence: Not on file    Allergies  Allergen Reactions  . Other Itching    Vitamin E cream  . Diflucan [Fluconazole] Rash    Prior to Admission medications   Medication Sig Start Date End Date Taking? Authorizing Provider  buPROPion (WELLBUTRIN XL) 150 MG 24 hr tablet Take 1 tablet (150 mg total) by mouth daily. 06/30/20  Yes [name], CNM  sertraline (ZOLOFT) 50 MG tablet Take 1 tablet (50 mg total) by mouth daily. 06/30/20  Yes [name], CNM    Review of Systems  Constitutional: Negative.   HENT: Negative.   Eyes: Negative.   Respiratory: Negative.   Cardiovascular: Negative.   Gastrointestinal: Negative.   Genitourinary: Negative.   Musculoskeletal: Negative.   Skin: Negative.   Neurological: Negative.   Psychiatric/Behavioral: Negative.      Physical Exam BP 126/74   Wt 282 lb (127.9 kg)   BMI 46.93 kg/m  No LMP recorded. Physical Exam Constitutional:      General: She is not in acute distress.  Appearance: Normal appearance.  HENT:     Head: Normocephalic and atraumatic.  Eyes:     General: No scleral icterus.    Conjunctiva/sclera: Conjunctivae normal.  Neurological:     General: No focal deficit present.     Mental Status: She is alert and oriented to person, place, and time.     Cranial Nerves: No cranial nerve deficit.  Psychiatric:        Mood and Affect: Mood normal.        Behavior: Behavior normal.        Judgment: Judgment normal.     Female chaperone present for pelvic and breast  portions of the physical exam  Assessment: 25 y.o. G65P3003 female here for  1. Encounter for other general counseling or advice on contraception      Plan: Problem List Items Addressed This Visit   None   Visit  Diagnoses    Encounter for other general counseling or advice on contraception    -  Primary     25 y.o. [id number]  with undesired fertility, desires permanent sterilization.  Other reversible forms of contraception were discussed with patient; she declines all other modalities. Permanent nature of as well as associated risks of the procedure discussed with patient including but not limited to: risk of regret, permanence of method, bleeding, infection, injury to surrounding organs and need for additional procedures.  Failure risk of 0.5-1% with increased risk of ectopic gestation if pregnancy occurs was also discussed with patient.  We discussed that a BTL is not strictly needed since her husband has a vasectomy and that vasectomy has a very low rate of failure. She still would like to proceed. We discussed tubal occlusion versus salpingectomy and the possible ovarian cancer risk reducing benefits. She would like to proceed with salpingectomy.  A total of 23 minutes were spent face-to-face with the patient as well as preparation, review, communication, and documentation during this encounter.     Thomasene Mohair, MD 07/06/2020 5:54 PM

## 2020-07-17 ENCOUNTER — Telehealth: Payer: Self-pay

## 2020-07-17 NOTE — Telephone Encounter (Signed)
Called patient to schedule laparoscopic bilateral salpingectomy w Britt Boozer 5/24  H&P n/a  Covid testing n/a    Pre-admit phone call appointment to be requested. Appointments will be updated on pt MyChart. Explained that this appointment has a call window. Based on the time scheduled will indicate if the call will be received within a 4 hour window before 1:00 or after.  Advised that pt may also receive calls from the hospital pharmacy and pre-service center.  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 [employer] and [facility] Medicaid secondary insurance.  BTL consent has been signed.

## 2020-07-17 NOTE — Telephone Encounter (Signed)
-----   Message from Conard Novak, MD sent at 07/06/2020  5:57 PM EDT ----- Regarding: Schedule surgery Surgery Booking Request Patient Full Name:  Brittney Gill  MRN: [id number]  DOB: [birth year]  Surgeon: Thomasene Mohair, MD  Requested Surgery Date and Time: TBD Primary Diagnosis AND Code: Desires permanent sterilization Secondary Diagnosis and Code:  Surgical Procedure: laparoscopic bilateral salpingectomy RNFA Requested?: No L&D Notification: No Admission Status: same day surgery Length of Surgery: 50 min Special Case Needs: No H&P: No Phone Interview???:  Yes Interpreter: No Medical Clearance:  No Special Scheduling Instructions: No Any known health/anesthesia issues, diabetes, sleep apnea, latex allergy, defibrillator/pacemaker?: No Acuity: P3   (P1 highest, P2 delay may cause harm, P3 low, elective gyn, P4 lowest)

## 2020-07-26 ENCOUNTER — Other Ambulatory Visit: Payer: Self-pay | Admitting: Advanced Practice Midwife

## 2020-07-26 DIAGNOSIS — F418 Other specified anxiety disorders: Secondary | ICD-10-CM

## 2020-07-27 NOTE — Telephone Encounter (Signed)
Please advise 

## 2020-08-07 ENCOUNTER — Other Ambulatory Visit: Payer: Self-pay

## 2020-08-07 ENCOUNTER — Other Ambulatory Visit
Admission: RE | Admit: 2020-08-07 | Discharge: 2020-08-07 | Disposition: A | Discharge status: Home / Self Care | Payer: Managed Care, Other (non HMO) | Source: Ambulatory Visit | Attending: Obstetrics and Gynecology | Admitting: Obstetrics and Gynecology

## 2020-08-07 HISTORY — DX: Sleep apnea, unspecified: G47.30

## 2020-08-07 NOTE — Patient Instructions (Signed)
Your procedure is scheduled on: Tuesday Aug 15, 2020. Report to Day [facility] inside [facility] 2nd floor (stop by admissions desk first before getting on elevator). To find out your arrival time please call [phone] between 1PM - 3PM on Monday Aug 14, 2020.  Remember: Instructions that are not followed completely may result in serious medical risk,  up to and including death, or upon the discretion of your surgeon and anesthesiologist your  surgery may need to be rescheduled.     _X__ 1. Do not eat food after midnight the night before your procedure.                 No chewing gum or hard candies. You may drink clear liquids up to 2 hours                 before you are scheduled to arrive for your surgery- DO not drink clear                 liquids within 2 hours of the start of your surgery.                 Clear Liquids include:  water, apple juice without pulp, clear Gatorade, G2 or                  Gatorade Zero (avoid Red/Purple/Blue), Black Coffee or Tea (Do not add                 anything to coffee or tea).  __X__2.  On the morning of surgery brush your teeth with toothpaste and water, you                may rinse your mouth with mouthwash if you wish.  Do not swallow any toothpaste of mouthwash.     _X__ 3.  No Alcohol for 24 hours before or after surgery.   _X__ 4.  Do Not Smoke or use e-cigarettes For 24 Hours Prior to Your Surgery.                 Do not use any chewable tobacco products for at least 6 hours prior to                 Surgery.  _X__  5.  Do not use any recreational drugs (marijuana, cocaine, heroin, ecstasy, MDMA or other)                For at least one week prior to your surgery.  Combination of these drugs with anesthesia                May have life threatening results.  __X__ 6.  Notify your doctor if there is any change in your medical condition      (cold, fever, infections).     Do not wear jewelry, make-up, hairpins,  clips or nail polish. Do not wear lotions, powders, or perfumes. You may wear deodorant. Do not shave 48 hours prior to surgery.  Do not bring valuables to the hospital.    [facility] is not responsible for any belongings or valuables.  Contacts, dentures or bridgework may not be worn into surgery. Leave your suitcase in the car. After surgery it may be brought to your room. For patients admitted to the hospital, discharge time is determined by your treatment team.   Patients discharged the day of surgery will not be allowed to drive home.   Make arrangements  for someone to be with you for the first 24 hours of your Same Day Discharge.  __X__ Take these medicines the morning of surgery with A SIP OF WATER:    1. buPROPion (WELLBUTRIN XL) 150 MG   2. sertraline (ZOLOFT) 50 MG  3.   4.  5.  6.  ____ Fleet Enema (as directed)   __X__ Use Antibacterial Soap (or wipes) as directed  ____ Use Benzoyl Peroxide Gel as instructed  ____ Use inhalers on the day of surgery  ____ Stop metformin 2 days prior to surgery    ____ Take 1/2 of usual insulin dose the night before surgery. No insulin the morning          of surgery.   ____ Call your PCP, cardiologist, or Pulmonologist if taking Coumadin/Plavix/aspirin and ask when to stop before your surgery.   __X__ One Week prior to surgery- Stop Anti-inflammatories such as Ibuprofen, Aleve, Advil, Motrin, meloxicam (MOBIC), diclofenac, etodolac, ketorolac, Toradol, Daypro, piroxicam, Goody's or BC powders. OK TO USE TYLENOL IF NEEDED   __X__ Stop supplements until after surgery.    ____ Bring C-Pap to the hospital.    If you have any questions regarding your pre-procedure instructions,  Please call Pre-admit Testing at [phone].

## 2020-08-11 NOTE — Telephone Encounter (Signed)
Annual refill ordered in April of this year.

## 2020-08-15 ENCOUNTER — Ambulatory Visit: Payer: Managed Care, Other (non HMO) | Admitting: Certified Registered"

## 2020-08-15 ENCOUNTER — Encounter: Payer: Self-pay | Admitting: Obstetrics and Gynecology

## 2020-08-15 ENCOUNTER — Encounter
Admission: RE | Disposition: A | Discharge status: Home / Self Care | Payer: Self-pay | Source: Home / Self Care | Attending: Obstetrics and Gynecology

## 2020-08-15 ENCOUNTER — Ambulatory Visit
Admission: RE | Admit: 2020-08-15 | Discharge: 2020-08-15 | Disposition: A | Discharge status: Home / Self Care | Payer: Managed Care, Other (non HMO) | Attending: Obstetrics and Gynecology | Admitting: Obstetrics and Gynecology

## 2020-08-15 ENCOUNTER — Other Ambulatory Visit: Payer: Self-pay

## 2020-08-15 DIAGNOSIS — Z87891 Personal history of nicotine dependence: Secondary | ICD-10-CM | POA: Insufficient documentation

## 2020-08-15 DIAGNOSIS — Z883 Allergy status to other anti-infective agents status: Secondary | ICD-10-CM | POA: Diagnosis not present

## 2020-08-15 DIAGNOSIS — Z302 Encounter for sterilization: Secondary | ICD-10-CM | POA: Diagnosis not present

## 2020-08-15 DIAGNOSIS — Z79899 Other long term (current) drug therapy: Secondary | ICD-10-CM | POA: Diagnosis not present

## 2020-08-15 HISTORY — PX: LAPAROSCOPIC BILATERAL SALPINGECTOMY: SHX5889

## 2020-08-15 LAB — POCT PREGNANCY, URINE: Preg Test, Ur: NEGATIVE

## 2020-08-15 SURGERY — SALPINGECTOMY, BILATERAL, LAPAROSCOPIC
Anesthesia: General | Laterality: Bilateral

## 2020-08-15 MED ORDER — LACTATED RINGERS IV SOLN: INTRAVENOUS | Status: DC

## 2020-08-15 MED ORDER — ORAL CARE MOUTH RINSE: 15.0000 mL | Freq: Once | OROMUCOSAL | Status: AC

## 2020-08-15 MED ORDER — ONDANSETRON HCL 4 MG/2ML IJ SOLN
INTRAMUSCULAR | Status: DC | PRN
  Administered 2020-08-15: 4 mg via INTRAVENOUS

## 2020-08-15 MED ORDER — FAMOTIDINE 20 MG PO TABS: 20.0000 mg | ORAL_TABLET | Freq: Once | ORAL | Status: AC

## 2020-08-15 MED ORDER — ONDANSETRON HCL 4 MG/2ML IJ SOLN
INTRAMUSCULAR | Status: AC
  Filled 2020-08-15: qty 2

## 2020-08-15 MED ORDER — PROMETHAZINE HCL 25 MG/ML IJ SOLN
6.2500 mg | INTRAMUSCULAR | Status: DC | PRN
Start: 2020-08-15 — End: 2020-08-15

## 2020-08-15 MED ORDER — MIDAZOLAM HCL 2 MG/2ML IJ SOLN
INTRAMUSCULAR | Status: DC | PRN
  Administered 2020-08-15: 2 mg via INTRAVENOUS

## 2020-08-15 MED ORDER — DEXAMETHASONE SODIUM PHOSPHATE 10 MG/ML IJ SOLN
INTRAMUSCULAR | Status: AC
  Filled 2020-08-15: qty 1

## 2020-08-15 MED ORDER — FENTANYL CITRATE (PF) 100 MCG/2ML IJ SOLN
INTRAMUSCULAR | Status: AC
  Filled 2020-08-15: qty 2

## 2020-08-15 MED ORDER — ROCURONIUM BROMIDE 10 MG/ML (PF) SYRINGE
PREFILLED_SYRINGE | INTRAVENOUS | Status: AC
  Filled 2020-08-15: qty 10

## 2020-08-15 MED ORDER — HYDROCODONE-ACETAMINOPHEN 5-325 MG PO TABS: 1.0000 | ORAL_TABLET | Freq: Four times a day (QID) | ORAL | 0 refills | Status: DC | PRN

## 2020-08-15 MED ORDER — IBUPROFEN 600 MG PO TABS: 600.0000 mg | ORAL_TABLET | Freq: Four times a day (QID) | ORAL | 0 refills | Status: DC

## 2020-08-15 MED ORDER — SUGAMMADEX SODIUM 200 MG/2ML IV SOLN
INTRAVENOUS | Status: DC | PRN
  Administered 2020-08-15: 200 mg via INTRAVENOUS

## 2020-08-15 MED ORDER — LIDOCAINE HCL (CARDIAC) PF 100 MG/5ML IV SOSY
PREFILLED_SYRINGE | INTRAVENOUS | Status: DC | PRN
  Administered 2020-08-15: 40 mg via INTRAVENOUS

## 2020-08-15 MED ORDER — LIDOCAINE HCL (PF) 2 % IJ SOLN
INTRAMUSCULAR | Status: AC
  Filled 2020-08-15: qty 2

## 2020-08-15 MED ORDER — MIDAZOLAM HCL 2 MG/2ML IJ SOLN
INTRAMUSCULAR | Status: AC
  Filled 2020-08-15: qty 2

## 2020-08-15 MED ORDER — ROCURONIUM BROMIDE 100 MG/10ML IV SOLN
INTRAVENOUS | Status: DC | PRN
  Administered 2020-08-15: 40 mg via INTRAVENOUS

## 2020-08-15 MED ORDER — POVIDONE-IODINE 10 % EX SWAB: 2.0000 "application " | Freq: Once | CUTANEOUS | Status: DC

## 2020-08-15 MED ORDER — PHENYLEPHRINE HCL (PRESSORS) 10 MG/ML IV SOLN
INTRAVENOUS | Status: AC
  Filled 2020-08-15: qty 1

## 2020-08-15 MED ORDER — CHLORHEXIDINE GLUCONATE 0.12 % MT SOLN: 15.0000 mL | Freq: Once | OROMUCOSAL | Status: AC

## 2020-08-15 MED ORDER — SUCCINYLCHOLINE CHLORIDE 20 MG/ML IJ SOLN
INTRAMUSCULAR | Status: DC | PRN
  Administered 2020-08-15: 140 mg via INTRAVENOUS

## 2020-08-15 MED ORDER — FENTANYL CITRATE (PF) 100 MCG/2ML IJ SOLN
INTRAMUSCULAR | Status: DC | PRN
  Administered 2020-08-15: 100 ug via INTRAVENOUS
  Administered 2020-08-15 (×2): 50 ug via INTRAVENOUS

## 2020-08-15 MED ORDER — PROPOFOL 10 MG/ML IV BOLUS
INTRAVENOUS | Status: AC
  Filled 2020-08-15: qty 20

## 2020-08-15 MED ORDER — FENTANYL CITRATE (PF) 100 MCG/2ML IJ SOLN: 25.0000 ug | INTRAMUSCULAR | Status: DC | PRN

## 2020-08-15 MED ORDER — DEXAMETHASONE SODIUM PHOSPHATE 10 MG/ML IJ SOLN
INTRAMUSCULAR | Status: DC | PRN
  Administered 2020-08-15: 10 mg via INTRAVENOUS

## 2020-08-15 MED ORDER — SUCCINYLCHOLINE CHLORIDE 200 MG/10ML IV SOSY
PREFILLED_SYRINGE | INTRAVENOUS | Status: AC
  Filled 2020-08-15: qty 10

## 2020-08-15 MED ORDER — FAMOTIDINE 20 MG PO TABS
ORAL_TABLET | ORAL | Status: AC
  Administered 2020-08-15: 20 mg via ORAL
  Filled 2020-08-15: qty 1

## 2020-08-15 MED ORDER — CHLORHEXIDINE GLUCONATE 0.12 % MT SOLN
OROMUCOSAL | Status: AC
  Administered 2020-08-15: 15 mL via OROMUCOSAL
  Filled 2020-08-15: qty 15

## 2020-08-15 MED ORDER — BUPIVACAINE HCL 0.5 % IJ SOLN
INTRAMUSCULAR | Status: DC | PRN
  Administered 2020-08-15: 13 mL

## 2020-08-15 MED ORDER — BUPIVACAINE HCL (PF) 0.5 % IJ SOLN
INTRAMUSCULAR | Status: AC
  Filled 2020-08-15: qty 30

## 2020-08-15 MED ORDER — PROPOFOL 10 MG/ML IV BOLUS
INTRAVENOUS | Status: DC | PRN
  Administered 2020-08-15: 200 mg via INTRAVENOUS

## 2020-08-15 SURGICAL SUPPLY — 50 items
ADH SKN CLS APL DERMABOND .7 (GAUZE/BANDAGES/DRESSINGS) ×1
ANCHOR TIS RET SYS 235ML (MISCELLANEOUS) IMPLANT
APL PRP STRL LF DISP 70% ISPRP (MISCELLANEOUS) ×1
BAG DRN RND TRDRP ANRFLXCHMBR (UROLOGICAL SUPPLIES) ×1
BAG TISS RTRVL C235 10X14 (MISCELLANEOUS)
BAG URINE DRAIN 2000ML AR STRL (UROLOGICAL SUPPLIES) ×2 IMPLANT
BLADE SURG SZ11 CARB STEEL (BLADE) ×2 IMPLANT
CATH FOLEY 2WAY  5CC 16FR (CATHETERS) ×1
CATH FOLEY 2WAY 5CC 16FR (CATHETERS) ×1
CATH URTH 16FR FL 2W BLN LF (CATHETERS) ×1 IMPLANT
CHLORAPREP W/TINT 26 (MISCELLANEOUS) ×2 IMPLANT
COVER WAND RF STERILE (DRAPES) ×2 IMPLANT
DERMABOND ADVANCED (GAUZE/BANDAGES/DRESSINGS) ×1
DERMABOND ADVANCED .7 DNX12 (GAUZE/BANDAGES/DRESSINGS) ×1 IMPLANT
GAUZE 4X4 16PLY RFD (DISPOSABLE) ×2 IMPLANT
GLOVE SURG ENC MOIS LTX SZ7 (GLOVE) ×4 IMPLANT
GLOVE SURG UNDER LTX SZ7.5 (GLOVE) ×2 IMPLANT
GOWN STRL REUS W/ TWL LRG LVL3 (GOWN DISPOSABLE) ×2 IMPLANT
GOWN STRL REUS W/ TWL XL LVL3 (GOWN DISPOSABLE) IMPLANT
GOWN STRL REUS W/TWL LRG LVL3 (GOWN DISPOSABLE) ×4
GOWN STRL REUS W/TWL XL LVL3 (GOWN DISPOSABLE)
GRASPER SUT TROCAR 14GX15 (MISCELLANEOUS) IMPLANT
IRRIGATION STRYKERFLOW (MISCELLANEOUS) IMPLANT
IRRIGATOR STRYKERFLOW (MISCELLANEOUS) IMPLANT
IV LACTATED RINGERS 1000ML (IV SOLUTION) IMPLANT
KIT PINK PAD W/HEAD ARE REST (MISCELLANEOUS) ×2 IMPLANT
KIT PINK PAD W/HEAD ARM REST (MISCELLANEOUS) ×1 IMPLANT
KIT TURNOVER CYSTO (KITS) ×2 IMPLANT
LABEL OR SOLS (LABEL) ×2 IMPLANT
LIGASURE VESSEL 5MM BLUNT TIP (ELECTROSURGICAL) ×2 IMPLANT
MANIFOLD NEPTUNE II (INSTRUMENTS) ×2 IMPLANT
MANIPULATOR UTERINE 4.5 ZUMI (MISCELLANEOUS) IMPLANT
NS IRRIG 500ML POUR BTL (IV SOLUTION) ×2 IMPLANT
PACK GYN LAPAROSCOPIC (MISCELLANEOUS) ×2 IMPLANT
PAD OB MATERNITY 4.3X12.25 (PERSONAL CARE ITEMS) ×2 IMPLANT
PAD PREP 24X41 OB/GYN DISP (PERSONAL CARE ITEMS) ×2 IMPLANT
SCISSORS METZENBAUM CVD 33 (INSTRUMENTS) IMPLANT
SET TUBE SMOKE EVAC HIGH FLOW (TUBING) ×2 IMPLANT
SHEARS HARMONIC ACE PLUS 36CM (ENDOMECHANICALS) IMPLANT
SLEEVE ENDOPATH XCEL 5M (ENDOMECHANICALS) ×4 IMPLANT
SOL PREP PROV IODINE SCRUB 4OZ (MISCELLANEOUS) ×2 IMPLANT
SUT MNCRL 4-0 (SUTURE) ×2
SUT MNCRL 4-0 27 PS-2 XMFL (SUTURE) ×1 IMPLANT
SUT MNCRL 4-0 27XMFL (SUTURE) ×1
SUT VIC AB 2-0 UR6 27 (SUTURE) ×2 IMPLANT
SUTURE MNCRL 4-0 27XMF (SUTURE) ×1 IMPLANT
TROCAR 5M 150ML BLDLS (TROCAR) ×6 IMPLANT
TROCAR ENDO BLADELESS 11MM (ENDOMECHANICALS) ×2 IMPLANT
TROCAR XCEL NON-BLD 5MMX100MML (ENDOMECHANICALS) ×2 IMPLANT
TROCAR XCEL UNIV SLVE 11M 100M (ENDOMECHANICALS) IMPLANT

## 2020-08-15 NOTE — Transfer of Care (Signed)
Immediate Anesthesia Transfer of Care Note  Patient: Brittney Gill  Procedure(s) Performed: LAPAROSCOPIC BILATERAL SALPINGECTOMY (Bilateral )  Patient Location: PACU  Anesthesia Type:General  Level of Consciousness: awake, alert  and oriented  Airway & Oxygen Therapy: Patient Spontanous Breathing and Patient connected to face mask oxygen  Post-op Assessment: Report given to RN and Post -op Vital signs reviewed and stable  Post vital signs: Reviewed and stable  Last Vitals:  Vitals Value Taken Time  BP 159/87 08/15/20 0834  Temp    Pulse 85 08/15/20 0837  Resp 16 08/15/20 0837  SpO2 100 % 08/15/20 0837  Vitals shown include unvalidated device data.  Last Pain:  Vitals:   08/15/20 0650  TempSrc: Temporal  PainSc: 0-No pain         Complications: No complications documented.

## 2020-08-15 NOTE — H&P (Signed)
Preoperative History and Physical  Brittney Gill is a 25 y.o. [phone] here for surgical management of contracetpion.   No significant preoperative concerns.  History of Present Illness:  Patient is a 25 y.o. [id number] presenting for contraception consult.  She is currently using vasectomy and desiring to undergo BTL.  She has a past medical history significant for no contraindication to estrogen.  She specifically denies a history of migraine with aura, chronic hypertension, history of DVT/PE and smoking.  Reported No LMP recorded..  She strongly desires permanent sterilization in the form of salpingectomy.  She has been informed about the alternatives and risks of the procedure, as well as the benefits  Proposed surgery: laparoscopic bilateral salingectomy  Past Medical History:  Diagnosis Date  . Anxiety   . Depression   . Pregnancy occurring while using intrauterine contraceptive device (IUD) 02/02/2018  . Sleep apnea    not using cpap machine    Past Surgical History:  Procedure Laterality Date  . FRACTURE SURGERY     Left  . WISDOM TOOTH EXTRACTION     OB History  Gravida Para Term Preterm AB Living  3 3 3     3   SAB IAB Ectopic Multiple Live Births        0 3    # Outcome Date GA Lbr Len/2nd Weight Sex Delivery Anes PTL Lv  3 Term 05/15/20 [redacted]w[redacted]d / 01:27 4640 g F Vag-Spont EPI  LIV  2 Term 09/22/18 [redacted]w[redacted]d 06:40 / 01:33 3780 g F Vag-Spont EPI  LIV  1 Term 01/25/16 [redacted]w[redacted]d  3175 g M Vag-Spont  N LIV  Patient denies any other pertinent gynecologic issues.   No current facility-administered medications on file prior to encounter.   Current Outpatient Medications on File Prior to Encounter  Medication Sig Dispense Refill  . buPROPion (WELLBUTRIN XL) 150 MG 24 hr tablet Take 1 tablet (150 mg total) by mouth daily. 30 tablet 11  . Prenatal Vit-Fe Fumarate-FA (PRENATAL MULTIVITAMIN) TABS tablet Take 1 tablet by mouth daily at 12 noon.    . sertraline (ZOLOFT) 50 MG tablet Take 1  tablet (50 mg total) by mouth daily. 30 tablet 11   Allergies  Allergen Reactions  . Other Itching    Vitamin E cream  . Diflucan [Fluconazole] Rash    Social History:   reports that she quit smoking about 2 years ago. Her smoking use included cigarettes. She started smoking about 4 years ago. She smoked 0.50 packs per day. She has never used smokeless tobacco. She reports that she does not drink alcohol and does not use drugs.  Family History  Problem Relation Age of Onset  . Breast cancer Neg Hx   . Cancer Neg Hx   . Ovarian cancer Neg Hx     Review of Systems: Noncontributory  PHYSICAL EXAM: Blood pressure 132/85, pulse 83, temperature (!) 96.9 F (36.1 C), temperature source Temporal, resp. rate 16, SpO2 97 %, currently breastfeeding. CONSTITUTIONAL: Well-developed, well-nourished female in no acute distress.  HENT:  Normocephalic, atraumatic, External right and left ear normal. Oropharynx is clear and moist EYES: Conjunctivae and EOM are normal. Pupils are equal, round, and reactive to light. No scleral icterus.  NECK: Normal range of motion, supple, no masses SKIN: Skin is warm and dry. No rash noted. Not diaphoretic. No erythema. No pallor. NEUROLGIC: Alert and oriented to person, place, and time. Normal reflexes, muscle tone coordination. No cranial nerve deficit noted. PSYCHIATRIC: Normal mood and affect. Normal behavior.  Normal judgment and thought content. CARDIOVASCULAR: Normal heart rate noted, regular rhythm RESPIRATORY: Effort and breath sounds normal, no problems with respiration noted ABDOMEN: Soft, nontender, nondistended. PELVIC: Deferred MUSCULOSKELETAL: Normal range of motion. No edema and no tenderness. 2+ distal pulses.  Labs: Results for orders placed or performed during the hospital encounter of 08/15/20 (from the past 336 hour(s))  Pregnancy, urine POC   Collection Time: 08/15/20  6:43 AM  Result Value Ref Range   Preg Test, Ur NEGATIVE NEGATIVE     Imaging Studies: No results found.  Assessment: Encounter for sterilization  Plan: Patient will undergo surgical management with the above noted surgery.   The risks of surgery were discussed in detail with the patient including but not limited to: bleeding which may require transfusion or reoperation; infection which may require antibiotics; injury to surrounding organs which may involve bowel, bladder, ureters ; need for additional procedures including laparoscopy or laparotomy; thromboembolic phenomenon, surgical site problems and other postoperative/anesthesia complications. Likelihood of success in alleviating the patient's condition was discussed. Routine postoperative instructions will be reviewed with the patient and her family in detail after surgery.  The patient concurred with the proposed plan, giving informed written consent for the surgery.  Patient has been NPO since last night she will remain NPO for procedure.  Anesthesia and OR aware.  Preoperative prophylactic antibiotics, as indicated, and SCDs ordered on call to the OR.  To OR when ready.  Thomasene Mohair, MD 08/15/2020 7:21 AM

## 2020-08-15 NOTE — Discharge Instructions (Signed)
AMBULATORY SURGERY  °DISCHARGE INSTRUCTIONS ° ° °1) The drugs that you were given will stay in your system until tomorrow so for the next 24 hours you should not: ° °A) Drive an automobile °B) Make any legal decisions °C) Drink any alcoholic beverage ° ° °2) You may resume regular meals tomorrow.  Today it is better to start with liquids and gradually work up to solid foods. ° °You may eat anything you prefer, but it is better to start with liquids, then soup and crackers, and gradually work up to solid foods. ° ° °3) Please notify your doctor immediately if you have any unusual bleeding, trouble breathing, redness and pain at the surgery site, drainage, fever, or pain not relieved by medication. ° ° ° °4) Additional Instructions: ° ° ° ° ° ° ° °Please contact your physician with any problems or Same Day Surgery at [phone], Monday through Friday 6 am to 4 pm, or [place] [facility] Main number at [phone]. °

## 2020-08-15 NOTE — Anesthesia Postprocedure Evaluation (Signed)
Anesthesia Post Note  Patient: Brittney Gill  Procedure(s) Performed: LAPAROSCOPIC BILATERAL SALPINGECTOMY (Bilateral )  Patient location during evaluation: PACU Anesthesia Type: General Level of consciousness: awake and alert Pain management: pain level controlled Vital Signs Assessment: post-procedure vital signs reviewed and stable Respiratory status: spontaneous breathing, nonlabored ventilation, respiratory function stable and patient connected to nasal cannula oxygen Cardiovascular status: blood pressure returned to baseline and stable Postop Assessment: no apparent nausea or vomiting Anesthetic complications: no   No complications documented.   Last Vitals:  Vitals:   08/15/20 0900 08/15/20 0917  BP: 122/84 126/60  Pulse: 70 71  Resp: 11 16  Temp: 36.4 C (!) 36.1 C  SpO2: 97% 98%    Last Pain:  Vitals:   08/15/20 0917  TempSrc: Temporal  PainSc: 4                  Lenard Simmer

## 2020-08-15 NOTE — Anesthesia Preprocedure Evaluation (Signed)
Anesthesia Evaluation  Patient identified by MRN, date of birth, ID band Patient awake    Reviewed: Allergy & Precautions, H&P , NPO status , Patient's Chart, lab work & pertinent test results  History of Anesthesia Complications Negative for: history of anesthetic complications  Airway Mallampati: I  TM Distance: >3 FB Neck ROM: full    Dental  (+) Dental Advidsory Given, Teeth Intact   Pulmonary neg shortness of breath, sleep apnea , neg COPD, neg recent URI, former smoker,    Pulmonary exam normal        Cardiovascular negative cardio ROS Normal cardiovascular exam     Neuro/Psych PSYCHIATRIC DISORDERS Anxiety Depression negative neurological ROS     GI/Hepatic Neg liver ROS, GERD  ,  Endo/Other  neg diabetesMorbid obesity  Renal/GU negative Renal ROS  negative genitourinary   Musculoskeletal   Abdominal   Peds  Hematology  (+) Blood dyscrasia, anemia ,   Anesthesia Other Findings Past Medical History: No date: Anxiety No date: Depression 02/02/2018: Pregnancy occurring while using intrauterine  contraceptive device (IUD) No date: Sleep apnea     Comment:  not using cpap machine    Reproductive/Obstetrics (+) Breast feeding                              Anesthesia Physical  Anesthesia Plan  ASA: III  Anesthesia Plan: General   Post-op Pain Management:    Induction: Intravenous  PONV Risk Score and Plan: 3 and Ondansetron, Dexamethasone, Midazolam, Promethazine and Treatment may vary due to age or medical condition  Airway Management Planned: Oral ETT  Additional Equipment:   Intra-op Plan:   Post-operative Plan: Extubation in OR  Informed Consent:   Plan Discussed with:   Anesthesia Plan Comments:         Anesthesia Quick Evaluation

## 2020-08-15 NOTE — Op Note (Signed)
  Operative Note    Name: Brittney Gill  Date of Service: 08/15/2020  DOB: [birth year]  MRN: [id number]   Pre-Operative Diagnosis: Sterilization [Z30.2]  Post-Operative Diagnosis: Sterilization [Z30.2]  Procedures: Laparoscopic bilateral salpingectomy  Primary Surgeon: Thomasene Mohair, MD   EBL: 3 mL   IVF: 700 mL   Urine output: 200 mL clear urine at end of case  Specimens: right and left fallopian tubes  Drains: none  Complications: None   Disposition: PACU   Condition: Stable   Findings: normal appearing uterus, ovaries, and bilateral fallopian tubes  Procedure Summary:  The patient was taken to the operating room where general anesthesia was administered and found to be adequate. She was placed in the dorsal supine lithotomy position in Colby stirrups and prepped and draped in usual sterile fashion. After a timeout was called an indwelling catheter was placed in her bladder. A sponge-on-a-stick was placed in the vagina for uterine manipulation.    Attention was turned to the abdomen where after injection of local anesthetic, a 5 mm infraumbilical incision was made with the scalpel. Entry into the abdomen was obtained via Optiview trocar technique (a blunt entry technique with camera visualization through the obturator upon entry). Verification of entry into the abdomen was obtained using opening pressures. The abdomen was insufflated with CO2. The camera was introduced through the trocar with verification of atraumatic entry.  A 5 mm suprapubic port site was created under direct intra-abdominal camera visualization without difficulty.  A left lower quadrant 5 mm port site was created under direct intra-abdominal camera visualization without difficulty.   The left fallopian tube was grasped at the fimbriated end and using the 5 mm LigaSure device the mesosalpinx was cauterized and transected in a lateral to medial fashion.  The fallopian tube was removed in its entirety  through the 5 mm port.  The same procedure was carried out on the right fallopian tube without difficulty.  Hemostasis was noted along the transection line.  All instruments were removed.  The air was emptied of CO2 with the help of 5 deep breaths from anesthesia.  All trochars were removed.  Each incision site was closed with a 4-0 Vicryl subcutaneous stitch.  Each incision was then covered with surgical skin glue.   The sponge on a stick was removed from the vagina.  The catheter was removed from the bladder.  The vagina was swept digitally to verify that no instruments or sponges remained.  The patient tolerated the procedure well.  Sponge, lap, needle, and instrument counts were correct x 2.  VTE prophylaxis: SCDs. Antibiotic prophylaxis: none indicated and none given. She was awakened in the operating room and was taken to the PACU in stable condition.     Thomasene Mohair, MD 08/15/2020 8:41 AM

## 2020-08-15 NOTE — Anesthesia Procedure Notes (Addendum)
Procedure Name: Intubation Date/Time: 08/15/2020 7:42 AM Performed by: Rona Ravens, CRNA Pre-anesthesia Checklist: Patient identified, Patient being monitored, Timeout performed, Emergency Drugs available and Suction available Patient Re-evaluated:Patient Re-evaluated prior to induction Oxygen Delivery Method: Circle system utilized Preoxygenation: Pre-oxygenation with 100% oxygen Induction Type: IV induction Ventilation: Mask ventilation without difficulty Laryngoscope Size: Mac and 3 Grade View: Grade II Tube type: Oral Tube size: 7.0 mm Number of attempts: 1 Airway Equipment and Method: Stylet Placement Confirmation: ETT inserted through vocal cords under direct vision,  positive ETCO2 and breath sounds checked- equal and bilateral Secured at: 21 cm Tube secured with: Tape Dental Injury: Teeth and Oropharynx as per pre-operative assessment

## 2020-08-16 ENCOUNTER — Encounter: Payer: Self-pay | Admitting: Obstetrics and Gynecology

## 2020-08-16 LAB — SURGICAL PATHOLOGY

## 2020-08-27 ENCOUNTER — Other Ambulatory Visit: Payer: Self-pay | Admitting: Advanced Practice Midwife

## 2020-08-27 DIAGNOSIS — F418 Other specified anxiety disorders: Secondary | ICD-10-CM

## 2020-09-01 ENCOUNTER — Ambulatory Visit: Payer: Managed Care, Other (non HMO) | Admitting: Obstetrics and Gynecology

## 2020-09-07 ENCOUNTER — Other Ambulatory Visit (HOSPITAL_COMMUNITY): Payer: Self-pay

## 2020-09-15 ENCOUNTER — Encounter: Payer: Self-pay | Admitting: Obstetrics and Gynecology

## 2020-09-15 ENCOUNTER — Other Ambulatory Visit: Payer: Self-pay

## 2020-09-15 ENCOUNTER — Ambulatory Visit (INDEPENDENT_AMBULATORY_CARE_PROVIDER_SITE_OTHER): Payer: Medicaid Other | Admitting: Obstetrics and Gynecology

## 2020-09-15 VITALS — BP 130/70 | Ht 65.0 in | Wt 281.0 lb

## 2020-09-15 DIAGNOSIS — Z09 Encounter for follow-up examination after completed treatment for conditions other than malignant neoplasm: Secondary | ICD-10-CM

## 2020-09-15 NOTE — Progress Notes (Signed)
   Postoperative Follow-up Patient presents post op from laparoscopic bilateral salpingectomy 4 weeks ago for sterilization.  Subjective: Patient reports marked improvement in her preop symptoms. Eating a regular diet without difficulty. The patient is not having any pain.  Activity: normal activities of daily living.  She denies fever, chills, nausea, and vomiting.   Objective: Vital Signs: BP 130/70   Ht 5\' 5"  (1.651 m)   Wt 281 lb (127.5 kg)   Breastfeeding Yes   BMI 46.76 kg/m  Physical Exam Constitutional:      General: She is not in acute distress.    Appearance: Normal appearance. She is well-developed.  HENT:     Head: Normocephalic and atraumatic.  Eyes:     General: No scleral icterus.    Conjunctiva/sclera: Conjunctivae normal.  Cardiovascular:     Rate and Rhythm: Normal rate and regular rhythm.     Heart sounds: No murmur heard.   No friction rub. No gallop.  Pulmonary:     Effort: Pulmonary effort is normal. No respiratory distress.     Breath sounds: Normal breath sounds. No wheezing or rales.  Abdominal:     General: Bowel sounds are normal. There is no distension.     Palpations: Abdomen is soft. There is no mass.     Tenderness: There is no abdominal tenderness. There is no guarding or rebound.     Comments: Incisions: without erythema, induration, warmth, and tenderness. They are clean, dry, and intact.     Musculoskeletal:        General: Normal range of motion.     Cervical back: Normal range of motion and neck supple.  Neurological:     General: No focal deficit present.     Mental Status: She is alert and oriented to person, place, and time.     Cranial Nerves: No cranial nerve deficit.  Skin:    General: Skin is warm and dry.     Findings: No erythema.  Psychiatric:        Mood and Affect: Mood normal.        Behavior: Behavior normal.        Judgment: Judgment normal.     Assessment: 25 y.o. s/p above surgery progressing  well  Plan: Patient has done well after surgery with no apparent complications.  I have discussed the post-operative course to date, and the expected progress moving forward.  The patient understands what complications to be concerned about.  I will see the patient in routine follow up, or sooner if needed.    Activity plan: No restriction.  22, MD  09/15/2020, 12:12 PM

## 2020-12-27 NOTE — Telephone Encounter (Signed)
Mirena rcvd/charged 11/13/2018

## 2021-03-13 ENCOUNTER — Ambulatory Visit
Admission: EM | Admit: 2021-03-13 | Discharge: 2021-03-13 | Disposition: A | Discharge status: Home / Self Care | Payer: Medicaid Other | Attending: Family Medicine | Admitting: Family Medicine

## 2021-03-13 ENCOUNTER — Encounter: Payer: Self-pay | Admitting: Emergency Medicine

## 2021-03-13 ENCOUNTER — Other Ambulatory Visit: Payer: Self-pay

## 2021-03-13 DIAGNOSIS — Z20828 Contact with and (suspected) exposure to other viral communicable diseases: Secondary | ICD-10-CM | POA: Diagnosis not present

## 2021-03-13 MED ORDER — PROMETHAZINE-DM 6.25-15 MG/5ML PO SYRP: 5.0000 mL | ORAL_SOLUTION | Freq: Four times a day (QID) | ORAL | 0 refills | Status: DC | PRN

## 2021-03-13 MED ORDER — OSELTAMIVIR PHOSPHATE 75 MG PO CAPS: 75.0000 mg | ORAL_CAPSULE | Freq: Two times a day (BID) | ORAL | 0 refills | Status: DC

## 2021-03-13 NOTE — ED Triage Notes (Signed)
Pt presents with fever, cough, runny nose x 2 days. Pts daughter tested positive for Flu.

## 2021-03-13 NOTE — ED Provider Notes (Signed)
Renaldo Fiddler    CSN: [id number] Arrival date & time: 03/13/21  1506      History   Chief Complaint Chief Complaint  Patient presents with   Fever   Cough   Nasal Congestion    HPI Brittney Gill is a 25 y.o. female.   HPI Patient presents today with fever, cough, nasal congestion generalized body aches x2 days.  2 of her children recently tested positive for influenza A.  She has been managing symptoms with Tylenol and ibuprofen.  She endorses mild increased work of breathing but no overt shortness of breath and has no history of asthma or chronic bronchitis.  Past Medical History:  Diagnosis Date   Anxiety    Depression    Pregnancy occurring while using intrauterine contraceptive device (IUD) 02/02/2018   Sleep apnea    not using cpap machine     Patient Active Problem List   Diagnosis Date Noted   Admission for sterilization 08/15/2020   [redacted] weeks gestation of pregnancy 05/16/2020   Macrosomia 05/15/2020   Encounter for induction of labor 05/15/2020   Encounter for care or examination of lactating mother    Pregnancy 05/11/2020   Elevated blood pressure affecting pregnancy in third trimester, antepartum 05/11/2020   Macrosomia affecting management of mother in third trimester 04/28/2020   Anemia in pregnancy, third trimester 03/11/2020   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   Abnormal finding on antenatal screen 11/22/2019   Leukocytosis 08/27/2019   Hyperglycemia 08/27/2019   Postpartum care following vaginal delivery 09/22/2018   Depression with anxiety 02/10/2018   Obesity affecting pregnancy 02/10/2018   BMI 50.0-59.9, adult (HCC) 02/10/2018   Supervision of high risk pregnancy, antepartum 02/02/2018   Vitamin D deficiency 10/16/2017   Anxiety 07/01/2016   Morbid obesity (HCC) 09/23/2011    Past Surgical History:  Procedure Laterality Date   FRACTURE SURGERY     Left   LAPAROSCOPIC BILATERAL SALPINGECTOMY Bilateral 08/15/2020    Procedure: LAPAROSCOPIC BILATERAL SALPINGECTOMY;  Surgeon: Conard Novak, MD;  Location: [facility] ORS;  Service: Gynecology;  Laterality: Bilateral;   WISDOM TOOTH EXTRACTION      OB History     Gravida  3   Para  3   Term  3   Preterm      AB      Living  3      SAB      IAB      Ectopic      Multiple  0   Live Births  3            Home Medications    Prior to Admission medications   Medication Sig Start Date End Date Taking? Authorizing Provider  buPROPion (WELLBUTRIN XL) 150 MG 24 hr tablet TAKE 1 TABLET BY MOUTH EVERY DAY 08/28/20  Yes [name], CNM  oseltamivir (TAMIFLU) 75 MG capsule Take 1 capsule (75 mg total) by mouth 2 (two) times daily. 03/13/21  Yes Bing Neighbors, FNP  promethazine-dextromethorphan (PROMETHAZINE-DM) 6.25-15 MG/5ML syrup Take 5 mLs by mouth 4 (four) times daily as needed for cough. 03/13/21  Yes Bing Neighbors, FNP  sertraline (ZOLOFT) 50 MG tablet Take 1 tablet (50 mg total) by mouth daily. 06/30/20  Yes [name], CNM  ibuprofen (ADVIL) 600 MG tablet Take 1 tablet (600 mg total) by mouth every 6 (six) hours. 08/15/20   Conard Novak, MD  Prenatal Vit-Fe Fumarate-FA (PRENATAL MULTIVITAMIN) TABS tablet Take 1 tablet by mouth  daily at 12 noon.    [provider]    Family History Family History  Problem Relation Age of Onset   Breast cancer Neg Hx    Cancer Neg Hx    Ovarian cancer Neg Hx     Social History Social History   Tobacco Use   Smoking status: Former    Packs/day: 0.50    Types: Cigarettes    Start date: 04/01/2016    Quit date: 12/23/2017    Years since quitting: 3.2   Smokeless tobacco: Never   Tobacco comments:    Had quit in 2017, started back in 2018 but quit when she found out she was pregnant.  Vaping Use   Vaping Use: Never used  Substance Use Topics   Alcohol use: No    Comment: social-not often   Drug use: No     Allergies   Other and Diflucan  [fluconazole]   Review of Systems Review of Systems Pertinent negatives listed in HPI   Physical Exam Triage Vital Signs ED Triage Vitals [03/13/21 1738]  Enc Vitals Group     BP 120/82     Pulse Rate (!) 101     Resp 16     Temp 99.9 F (37.7 C)     Temp Source Oral     SpO2 98 %     Weight      Height      Head Circumference      Peak Flow      Pain Score      Pain Loc      Pain Edu?      Excl. in [place]?    No data found.  Updated Vital Signs BP 120/82 (BP Location: Right Leg)    Pulse (!) 101    Temp 99.9 F (37.7 C) (Oral)    Resp 16    LMP  (LMP Unknown)    SpO2 98%   Visual Acuity Right Eye Distance:   Left Eye Distance:   Bilateral Distance:    Right Eye Near:   Left Eye Near:    Bilateral Near:     Physical Exam  General Appearance:    Alert, cooperative, no distress  HENT:   Normocephalic, ears normal, nares mucosal edema with congestion, rhinorrhea, oropharynx  patent without exudate  Eyes:    PERRL, conjunctiva/corneas clear, EOM's intact       Lungs:     Clear to auscultation bilaterally, respirations unlabored  Heart:    Regular rate and rhythm  Neurologic:   Awake, alert, oriented x 3. No apparent focal neurological           defect.      UC Treatments / Results  Labs (all labs ordered are listed, but only abnormal results are displayed) Labs Reviewed - No data to display  EKG   Radiology No results found.  Procedures Procedures (including critical care time)  Medications Ordered in UC Medications - No data to display  Initial Impression / Assessment and Plan / UC Course  I have reviewed the triage vital signs and the nursing notes.  Pertinent labs & imaging results that were available during my care of the patient were reviewed by me and considered in my medical decision making (see chart for details).    Close exposure to influenza based on symptoms treating for suspected influenza infection. Continue to alternate Tylenol and  ibuprofen for management of fever. Force fluids to maintain hydration. Tamiflu TWICE daily for  the next 5 days to reduce symptoms and course of influenza virus.  If you develop any shortness of breath, wheezing or difficulty breathing go immediately to the nearest emergency department.  Final Clinical Impressions(s) / UC Diagnoses   Final diagnoses:  Exposure to influenza     Discharge Instructions      Suspected influenza illness. Continue to alternate Tylenol and ibuprofen for management of fever. Force fluids to maintain hydration. Tamiflu twice daily for the next 5 days to reduce symptoms and course of influenza virus.  If you develop any shortness of breath, wheezing or difficulty breathing go immediately to the nearest emergency department.      ED Prescriptions     Medication Sig Dispense Auth. Provider   oseltamivir (TAMIFLU) 75 MG capsule Take 1 capsule (75 mg total) by mouth 2 (two) times daily. 10 capsule Bing Neighbors, FNP   promethazine-dextromethorphan (PROMETHAZINE-DM) 6.25-15 MG/5ML syrup Take 5 mLs by mouth 4 (four) times daily as needed for cough. 140 mL Bing Neighbors, FNP      PDMP not reviewed this encounter.   Bing Neighbors, FNP 03/13/21 [phone]

## 2021-03-13 NOTE — Discharge Instructions (Addendum)
Suspected influenza illness. Continue to alternate Tylenol and ibuprofen for management of fever. Force fluids to maintain hydration. Tamiflu twice daily for the next 5 days to reduce symptoms and course of influenza virus.  If you develop any shortness of breath, wheezing or difficulty breathing go immediately to the nearest emergency department.

## 2021-03-29 ENCOUNTER — Ambulatory Visit: Payer: Self-pay | Admitting: *Deleted

## 2021-03-29 NOTE — Telephone Encounter (Signed)
. °  Chief Complaint: eye,facial swelling- left Symptoms: left eye swelling, facial swelling, left neck lymph node swelling Frequency: started yesterday Pertinent Negatives: Patient denies fever Disposition: [] ED /[x] Urgent Care (no appt availability in office) / [] Appointment(In office/virtual)/ []  [facility] [facility]/ [] Home Care/ [] Refused Recommended Disposition /[] [place] [facility]/ []  Follow-up with PCP Additional Notes: Call to office- no appointment available   Reason for Disposition  MODERATE-SEVERE eyelid swelling on one side  (Exception: due to a mosquito bite)  Protocols used: Eye - Swelling-A-AH

## 2021-03-29 NOTE — Telephone Encounter (Signed)
Reason for Disposition  Face swelling is painful to touch  Answer Assessment - Initial Assessment Questions 1. ONSET: "When did the swelling start?" (e.g., minutes, hours, days)     Yesterday- eye pain-watering 2. LOCATION: "What part of the face is swollen?"     Left side of face/eye 3. SEVERITY: "How swollen is it?"     Patient can see 4. ITCHING: "Is there any itching?" If Yes, ask: "How much?"   (Scale 1-10; mild, moderate or severe)     Yes-mild 5. PAIN: "Is the swelling painful to touch?" If Yes, ask: "How painful is it?"   (Scale 1-10; mild, moderate or severe)   - NONE (0): no pain   - MILD (1-3): doesn't interfere with normal activities    - MODERATE (4-7): interferes with normal activities or awakens from sleep    - SEVERE (8-10): excruciating pain, unable to do any normal activities      Pain in corner of eye-  6. FEVER: "Do you have a fever?" If Yes, ask: "What is it, how was it measured, and when did it start?"      no 7. CAUSE: "What do you think is causing the face swelling?"     Neck lymph node swollen 8. RECURRENT SYMPTOM: "Have you had face swelling before?" If Yes, ask: "When was the last time?" "What happened that time?"     no 9. OTHER SYMPTOMS: "Do you have any other symptoms?" (e.g., toothache, leg swelling)     *No Answer* 10. PREGNANCY: "Is there any chance you are pregnant?" "When was your last menstrual period?"       *No Answer*  Protocols used: Face Swelling-A-AH

## 2021-03-31 ENCOUNTER — Emergency Department
Admission: EM | Admit: 2021-03-31 | Discharge: 2021-03-31 | Disposition: A | Discharge status: Home / Self Care | Payer: Medicaid Other | Attending: Emergency Medicine | Admitting: Emergency Medicine

## 2021-03-31 ENCOUNTER — Emergency Department: Payer: Medicaid Other

## 2021-03-31 ENCOUNTER — Encounter: Payer: Self-pay | Admitting: Emergency Medicine

## 2021-03-31 ENCOUNTER — Other Ambulatory Visit: Payer: Self-pay

## 2021-03-31 DIAGNOSIS — D72829 Elevated white blood cell count, unspecified: Secondary | ICD-10-CM | POA: Diagnosis not present

## 2021-03-31 DIAGNOSIS — L03213 Periorbital cellulitis: Secondary | ICD-10-CM | POA: Insufficient documentation

## 2021-03-31 DIAGNOSIS — J0100 Acute maxillary sinusitis, unspecified: Secondary | ICD-10-CM | POA: Insufficient documentation

## 2021-03-31 DIAGNOSIS — H5712 Ocular pain, left eye: Secondary | ICD-10-CM | POA: Diagnosis present

## 2021-03-31 LAB — CBC WITH DIFFERENTIAL/PLATELET
Abs Immature Granulocytes: 0.08 10*3/uL — ABNORMAL HIGH (ref 0.00–0.07)
Basophils Absolute: 0 10*3/uL (ref 0.0–0.1)
Basophils Relative: 0 %
Eosinophils Absolute: 0 10*3/uL (ref 0.0–0.5)
Eosinophils Relative: 0 %
HCT: 38.8 % (ref 36.0–46.0)
Hemoglobin: 11.6 g/dL — ABNORMAL LOW (ref 12.0–15.0)
Immature Granulocytes: 1 %
Lymphocytes Relative: 6 %
Lymphs Abs: 0.8 10*3/uL (ref 0.7–4.0)
MCH: 23.6 pg — ABNORMAL LOW (ref 26.0–34.0)
MCHC: 29.9 g/dL — ABNORMAL LOW (ref 30.0–36.0)
MCV: 79 fL — ABNORMAL LOW (ref 80.0–100.0)
Monocytes Absolute: 0.4 10*3/uL (ref 0.1–1.0)
Monocytes Relative: 3 %
Neutro Abs: 12.2 10*3/uL — ABNORMAL HIGH (ref 1.7–7.7)
Neutrophils Relative %: 90 %
Platelets: 434 10*3/uL — ABNORMAL HIGH (ref 150–400)
RBC: 4.91 MIL/uL (ref 3.87–5.11)
RDW: 15.1 % (ref 11.5–15.5)
WBC: 13.6 10*3/uL — ABNORMAL HIGH (ref 4.0–10.5)
nRBC: 0 % (ref 0.0–0.2)

## 2021-03-31 LAB — BASIC METABOLIC PANEL
Anion gap: 7 (ref 5–15)
BUN: 11 mg/dL (ref 6–20)
CO2: 24 mmol/L (ref 22–32)
Calcium: 9.1 mg/dL (ref 8.9–10.3)
Chloride: 105 mmol/L (ref 98–111)
Creatinine, Ser: 0.52 mg/dL (ref 0.44–1.00)
GFR, Estimated: >60 mL/min (ref >60)
Glucose, Bld: 104 mg/dL — ABNORMAL HIGH (ref 70–99)
Potassium: 4.1 mmol/L (ref 3.5–5.1)
Sodium: 136 mmol/L (ref 135–145)

## 2021-03-31 LAB — POC URINE PREG, ED: Preg Test, Ur: NEGATIVE

## 2021-03-31 MED ORDER — HYDROCODONE-ACETAMINOPHEN 5-325 MG PO TABS
1.0000 | ORAL_TABLET | Freq: Four times a day (QID) | ORAL | 0 refills | Status: AC | PRN
Start: 2021-03-31 — End: 2021-04-03

## 2021-03-31 MED ORDER — IOHEXOL 300 MG/ML  SOLN
75.0000 mL | Freq: Once | INTRAMUSCULAR | Status: AC | PRN
  Administered 2021-03-31: 75 mL via INTRAVENOUS
  Filled 2021-03-31: qty 75

## 2021-03-31 NOTE — ED Notes (Signed)
Patient was discharged by provider.

## 2021-03-31 NOTE — ED Provider Notes (Signed)
[facility] Provider Note    Event Date/Time   First MD Initiated Contact with Patient 03/31/21 1159     (approximate)   History   Eye Problem   HPI  Brittney Gill is a 26 y.o. female with no significant past medical history presents to the emergency department for treatment and evaluation of worsening pain.  She was diagnosed with periorbital cellulitis at urgent care yesterday and was started on Augmentin.  She has taken medication as prescribed but upon awakening this morning, her pain was worse.  She states that the swelling has decreased somewhat.  She denies fever.      Physical Exam   Triage Vital Signs: ED Triage Vitals  Enc Vitals Group     BP 03/31/21 1158 (!) 132/91     Pulse Rate 03/31/21 1158 86     Resp 03/31/21 1158 18     Temp 03/31/21 1158 98.5 F (36.9 C)     Temp src --      SpO2 03/31/21 1158 99 %     Weight 03/31/21 1045 286 lb 9.6 oz (130 kg)     Height 03/31/21 1045 5\' 5"  (1.651 m)     Head Circumference --      Peak Flow --      Pain Score 03/31/21 1045 5     Pain Loc --      Pain Edu? --      Excl. in [place]? --     Most recent vital signs: Vitals:   03/31/21 1158  BP: (!) 132/91  Pulse: 86  Resp: 18  Temp: 98.5 F (36.9 C)  SpO2: 99%    General: Awake, no distress.  CV:  Good peripheral perfusion.  Resp:  Normal effort.  Abd:  No distention.  Other:  PERRL.  Mild left eye pain with EOM, especially when looking to the right.   ED Results / Procedures / Treatments   Labs (all labs ordered are listed, but only abnormal results are displayed) Labs Reviewed  BASIC METABOLIC PANEL - Abnormal; Notable for the following components:      Result Value   Glucose, Bld 104 (*)    All other components within normal limits  CBC WITH DIFFERENTIAL/PLATELET - Abnormal; Notable for the following components:   WBC 13.6 (*)    Hemoglobin 11.6 (*)    MCV 79.0 (*)    MCH 23.6 (*)    MCHC 29.9 (*)    Platelets 434  (*)    Neutro Abs 12.2 (*)    Abs Immature Granulocytes 0.08 (*)    All other components within normal limits  POC URINE PREG, ED     EKG  Not indicated.   RADIOLOGY CT orbits viewed by me. Radiology report reviewed. Sinusitis observed, no lesion or deep space infection.   PROCEDURES:  Critical Care performed: No  Procedures   MEDICATIONS ORDERED IN ED: Medications  iohexol (OMNIPAQUE) 300 MG/ML solution 75 mL (75 mLs Intravenous Contrast Given 03/31/21 1331)     IMPRESSION / MDM / ASSESSMENT AND PLAN / ED COURSE  I reviewed the triage vital signs and the nursing notes.                              Differential diagnosis includes, but is not limited to, post septal cellulitis, periorbital cellulitis   26 year old female presents to the emergency department for concern of worsening periorbital  cellulitis.  See HPI for further details.  CBC shows a mild leukocytosis with a left shift.  BMP is overall unremarkable.  CT orbits with contrast is negative for deep space infection.  She does have sinusitis.  Results discussed with the patient and family.  She was strongly encouraged to continue taking the Augmentin as prescribed and until finished.  She will be provided with a few tablets of Norco to use over the next few days for severe pain.  She is to follow-up with primary care or return to the emergency department for symptoms that are not improving, change, or worsen.      FINAL CLINICAL IMPRESSION(S) / ED DIAGNOSES   Final diagnoses:  Periorbital cellulitis of left eye  Acute maxillary sinusitis, recurrence not specified     Rx / DC Orders   ED Discharge Orders          Ordered    HYDROcodone-acetaminophen (NORCO/VICODIN) 5-325 MG tablet  Every 6 hours PRN        03/31/21 1419             Note:  This document was prepared using Dragon voice recognition software and may include unintentional dictation errors.   Victorino Dike, FNP 03/31/21  1440    Duffy Bruce, MD 04/01/21 [phone]

## 2021-03-31 NOTE — ED Notes (Signed)
Pt attempting to provide urine sample now. See triage note. Pt in with redness, pain, and itchiness to L eye. States minimal drainage. Denies fever. Denies issues with vision in L eye.

## 2021-03-31 NOTE — Discharge Instructions (Signed)
Continue taking the antibiotic as prescribed.  Pain medication may make you sleepy or drowsy.  Do not drive or work with machinery while taking it.  Follow-up with primary care or return to the emergency department if symptoms do not improve, change, or worsen.

## 2021-03-31 NOTE — ED Triage Notes (Signed)
Pt reports was seen at [facility] on Thursday and they started treating her for periorbital cellulitis. Pt reports swelling is better but still has pain. Pt states is taking meds as prescribed.

## 2021-09-10 ENCOUNTER — Ambulatory Visit (INDEPENDENT_AMBULATORY_CARE_PROVIDER_SITE_OTHER): Payer: Medicaid Other | Admitting: Internal Medicine

## 2021-09-10 VITALS — BP 131/86 | HR 81 | Resp 16 | Ht 65.0 in | Wt 263.0 lb

## 2021-09-10 DIAGNOSIS — G4733 Obstructive sleep apnea (adult) (pediatric): Secondary | ICD-10-CM

## 2021-09-10 DIAGNOSIS — Z7189 Other specified counseling: Secondary | ICD-10-CM | POA: Diagnosis not present

## 2021-09-10 NOTE — Patient Instructions (Signed)

## 2021-09-10 NOTE — Progress Notes (Signed)
Sleep Medicine   Office Visit  Patient Name: Brittney Gill DOB: 1995/07/28 MRN [id number]    Chief Complaint: sleep study results   Brief History:  Brittney Gill presents with a history of OSA, anxiety & obesity. Sleep quality is poor.  The patient relates the following symptoms: snoring are also present. The patient goes to sleep at 10pm and wakes up at 7am.    She denies any choking, gasping or observed apneas.  Patient admits mood changes, irritability and mild to moderate EDS. Patient has noted restlessness of her legs at rest.   The patient  relates no unusual behavior during the night.  The patient reports a history of psychiatric problems. The Epworth Sleepiness Score is 10 out of 24 .  The patient relates  Cardiovascular risk factors include: none     ROS  General: (-) fever, (-) chills, (-) night sweat Nose and Sinuses: (-) nasal stuffiness or itchiness, (-) postnasal drip, (-) nosebleeds, (-) sinus trouble. Mouth and Throat: (-) sore throat, (-) hoarseness. Neck: (-) swollen glands, (-) enlarged thyroid, (-) neck pain. Respiratory: - cough, - shortness of breath, - wheezing. Neurologic: - numbness, - tingling. Psychiatric: + anxiety, + depression Sleep behavior: -sleep paralysis -hypnogogic hallucinations -dream enactment      -vivid dreams -cataplexy -night terrors -sleep walking   Current Medication: Outpatient Encounter Medications as of 09/10/2021  Medication Sig   buPROPion (WELLBUTRIN XL) 150 MG 24 hr tablet TAKE 1 TABLET BY MOUTH EVERY DAY   ibuprofen (ADVIL) 600 MG tablet Take 1 tablet (600 mg total) by mouth every 6 (six) hours.   oseltamivir (TAMIFLU) 75 MG capsule Take 1 capsule (75 mg total) by mouth 2 (two) times daily.   Prenatal Vit-Fe Fumarate-FA (PRENATAL MULTIVITAMIN) TABS tablet Take 1 tablet by mouth daily at 12 noon.   promethazine-dextromethorphan (PROMETHAZINE-DM) 6.25-15 MG/5ML syrup Take 5 mLs by mouth 4 (four) times daily as needed for cough.    sertraline (ZOLOFT) 50 MG tablet Take 1 tablet (50 mg total) by mouth daily.   No facility-administered encounter medications on file as of 09/10/2021.    Surgical History: Past Surgical History:  Procedure Laterality Date   FRACTURE SURGERY     Left   LAPAROSCOPIC BILATERAL SALPINGECTOMY Bilateral 08/15/2020   Procedure: LAPAROSCOPIC BILATERAL SALPINGECTOMY;  Surgeon: Conard Novak, MD;  Location: [facility] ORS;  Service: Gynecology;  Laterality: Bilateral;   WISDOM TOOTH EXTRACTION      Medical History: Past Medical History:  Diagnosis Date   Anxiety    Depression    Pregnancy occurring while using intrauterine contraceptive device (IUD) 02/02/2018   Sleep apnea    not using cpap machine     Family History: Non contributory to the present illness  Social History: Social History   Socioeconomic History   Marital status: Married    Spouse name: Not on file   Number of children: 2   Years of education: Not on file   Highest education level: High school graduate  Occupational History   Not on file  Tobacco Use   Smoking status: Former    Packs/day: 0.50    Types: Cigarettes    Start date: 04/01/2016    Quit date: 12/23/2017    Years since quitting: 3.7   Smokeless tobacco: Never   Tobacco comments:    Had quit in 2017, started back in 2018 but quit when she found out she was pregnant.  Vaping Use   Vaping Use: Never used  Substance and Sexual  Activity   Alcohol use: No    Comment: social-not often   Drug use: No   Sexual activity: Yes    Birth control/protection: Surgical    Comment: vasectomy  Other Topics Concern   Not on file  Social History Narrative   Not on file   Social Determinants of Health   Financial Resource Strain: Not on file  Food Insecurity: Not on file  Transportation Needs: Not on file  Physical Activity: Not on file  Stress: Not on file  Social Connections: Not on file  Intimate Partner Violence: Not on file    Vital  Signs: currently breastfeeding. There is no height or weight on file to calculate BMI.   Examination: General Appearance: The patient is well-developed, well-nourished, and in no distress. Neck Circumference: 40 cm Skin: Gross inspection of skin unremarkable. Head: normocephalic, no gross deformities. Eyes: no gross deformities noted. ENT: ears appear grossly normal Neurologic: Alert and oriented. No involuntary movements.    EPWORTH SLEEPINESS SCALE:  Scale:  (0)= no chance of dozing; (1)= slight chance of dozing; (2)= moderate chance of dozing; (3)= high chance of dozing  Chance  Situtation    Sitting and reading: 3    Watching TV: 2    Sitting Inactive in public: 0    As a passenger in car: 1      Lying down to rest: 2    Sitting and talking: 0    Sitting quielty after lunch: 2    In a car, stopped in traffic: 0   TOTAL SCORE:   10 out of 24    SLEEP STUDIES:  PSG 02/07/2020  -  overall AHI 15.6, min SpO2 83% Titration 07/24/21  -  APAP 10 - 20cmh2O, overnight oxim post setup to ensure oxygenation   LABS: No results found for this or any previous visit (from the past 2160 hour(s)).  Radiology: CT Orbits W Contrast  Result Date: 03/31/2021 CLINICAL DATA:  Orbital cellulitis suspected EXAM: CT ORBITS WITH CONTRAST TECHNIQUE: Multidetector CT images was performed according to the standard protocol following intravenous contrast administration. CONTRAST:  54mL OMNIPAQUE IOHEXOL 300 MG/ML  SOLN COMPARISON:  No pertinent prior exam. FINDINGS: Orbits: No orbital mass or evidence of inflammation. No discrete, drainable fluid collection. Normal appearance of the globes, optic nerve-sheath complexes, extraocular muscles, orbital fat and lacrimal glands. Visible paranasal sinuses: Moderate mucosal thickening of the left maxillary sinus and right frontal sinus. Mild mucosal thickening of scattered ethmoid air cells, left sphenoid sinus, and inferior right maxillary sinus.  Soft tissues: Normal. Osseous: No fracture or aggressive lesion. Limited intracranial: No acute or significant finding. IMPRESSION: 1. Unremarkable appearance of the orbits. 2. Mild-to-moderate paranasal sinus mucosal thickening, as detailed above. Electronically Signed   By: Feliberto Harts M.D.   On: 03/31/2021 14:08    No results found.  No results found.    Assessment and Plan: Patient Active Problem List   Diagnosis Date Noted   Admission for sterilization 08/15/2020   [redacted] weeks gestation of pregnancy 05/16/2020   Macrosomia 05/15/2020   Encounter for induction of labor 05/15/2020   Encounter for care or examination of lactating mother    Pregnancy 05/11/2020   Elevated blood pressure affecting pregnancy in third trimester, antepartum 05/11/2020   Macrosomia affecting management of mother in third trimester 04/28/2020   Anemia in pregnancy, third trimester 03/11/2020   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   Abnormal finding on antenatal screen 11/22/2019   Leukocytosis 08/27/2019  Hyperglycemia 08/27/2019   Postpartum care following vaginal delivery 09/22/2018   Depression with anxiety 02/10/2018   Obesity affecting pregnancy 02/10/2018   BMI 50.0-59.9, adult (HCC) 02/10/2018   Supervision of high risk pregnancy, antepartum 02/02/2018   Vitamin D deficiency 10/16/2017   Anxiety 07/01/2016   Morbid obesity (HCC) 09/23/2011   1. OSA (obstructive sleep apnea) PLAN OSA:   Patient evaluation suggests high risk of sleep disordered breathing due to AHI of 15.6 on PSG, increasing to 52 in REM. Nadir oxygen saturation 83%.  Suggest: start APAP at 10-20  to treat the patient's sleep disordered breathing. The patient was also counselled on weight loss to optimize sleep health. She will require oximetry after she has acclimated to cpap to assess her treatment.    2. CPAP use counseling CPAP Counseling: had a lengthy discussion with the patient regarding the importance of PAP  therapy in management of the sleep apnea. Patient appears to understand the risk factor reduction and also understands the risks associated with untreated sleep apnea. Patient will try to make a good faith effort to remain compliant with therapy. Also instructed the patient on proper cleaning of the device including the water must be changed daily if possible and use of distilled water is preferred. Patient understands that the machine should be regularly cleaned with appropriate recommended cleaning solutions that do not damage the PAP machine for example given white vinegar and water rinses. Other methods such as ozone treatment may not be as good as these simple methods to achieve cleaning.   3. Morbid obesity (HCC) Obesity Counseling: Had a lengthy discussion regarding patients BMI and weight issues. Patient was instructed on portion control as well as increased activity. Also discussed caloric restrictions with trying to maintain intake less than 2000 Kcal. Discussions were made in accordance with the 5As of weight management. Simple actions such as not eating late and if able to, taking a walk is suggested.     General Counseling: I have discussed the findings of the evaluation and examination with Brittney Gill.  I have also discussed any further diagnostic evaluation thatmay be needed or ordered today. Brittney Gill verbalizes understanding of the findings of todays visit. We also reviewed her medications today and discussed drug interactions and side effects including but not limited excessive drowsiness and altered mental states. We also discussed that there is always a risk not just to her but also people around her. she has been encouraged to call the office with any questions or concerns that should arise related to todays visit.  No orders of the defined types were placed in this encounter.       I have personally obtained a history, evaluated the patient, evaluated pertinent data, formulated the  assessment and plan and placed orders.   This patient was seen today by Emmaline Kluver, PA-C in collaboration with Dr. Freda Munro.   Yevonne Pax, MD [facility] Diplomate ABMS Pulmonary and Critical Care Medicine Sleep medicine

## 2021-10-10 ENCOUNTER — Other Ambulatory Visit: Payer: Self-pay | Admitting: Surgery

## 2021-10-19 ENCOUNTER — Encounter: Payer: Self-pay | Admitting: Dietician

## 2021-10-19 ENCOUNTER — Encounter: Payer: 59 | Attending: Surgery | Admitting: Dietician

## 2021-10-19 VITALS — Ht 65.0 in | Wt 270.0 lb

## 2021-10-19 DIAGNOSIS — Z713 Dietary counseling and surveillance: Secondary | ICD-10-CM | POA: Diagnosis not present

## 2021-10-19 DIAGNOSIS — G4733 Obstructive sleep apnea (adult) (pediatric): Secondary | ICD-10-CM

## 2021-10-19 NOTE — Patient Instructions (Signed)
Investigate options for protein drinks ready made or powder mix; look for unflavored protein powder that can be added to broth, soup, jello, etc.  Decrease/ eliminate sodas by surgery  Practice chewing foods more thoroughly Use low fat cooking methods such as baking, grilling, crockpot, saute with small amount of oil or cooking spray.

## 2021-10-19 NOTE — Progress Notes (Signed)
Nutrition Assessment for Bariatric Surgery   Appointment start time: 1045   end time: 1145  Planned Surgery: Sleeve gastrectomy  Anthropometrics: Weight: 270.0lbs Height: 5'5" BMI: 44.93   Patient's Goal Weight: 180lbs  Clinical: Medical History: sleep apnea, anxiety, depression Medications and Supplements: buPROPion, ibuprofen prn, sertraline Relevant labs: no recent lab results available Notable symptoms: limited energy/ activity Drug allergies: diflucan (rash), vitamin E cream (itching on legs) Food allergies: none known  Lifestyle and Dietary History:  Dieting/ weight history:  Reports lifelong overweight/ obesity Lost to 260-265lbs after breastfeeding daughter 2022; pregnancy weight was 325.  Tried phentermine for a time, until having shortness of breath.  Has done weight watchers in the past; no specific or very restrictive diets. Best friend had bariatric surgery several months ago, and is providing support for patient.    Disordered or emotional eating history:  No eating disorder history Does some emotional eating -- stress, boredom  Physical activity: no regular activity  Dietary Recall:  Daily pattern: 2-3 meals and 1-2 snacks. Dining out: 3-4 meals per week. Breakfast: muffins from mix/ Vilma Meckel frozen biscuit, coffee  Snack: 11am goldfish crackers, soda Lunch: often snack food ie goldfish crackers, soda Snack: rarely  Supper: varies -- spaghetti/ chicken pie/ tacos/ quesadillas/ sloppy joes/ pizza homemade or takeout/ burgers Snack: maybe on weekends - dessert food Beverages: water, Mt Dew about 2 cans daily; sugar free lemonade, unsweetened tea, coffee in am   Nutrition Intervention: Instructed patient on pre-op diet goals and importance of close adherence to bariatric diet after surgery to avoid side effects and complications.  Discussed stages of the bariatric diet after surgery as well as the importance of adequate protein and fluid intake.  Provided  overview of 2-week pre-op diet.  Patient feels she will have difficulty finding an acceptable protein drink; she has tried several brands and did not like them due to aftertaste. Discussed options and criteria for nutritionally acceptable drinks. Discussed using unflavored protein powder to add to a variety of foods/ drinks.  Nutrition Diagnosis: [place]-3.3 Overweight/ obesity related to history of excess calories and inadequate physical activity as evidenced by patient with current BMI of 44.93, working on dietary changes for weight loss prior to bariatric surgery.  Teaching method(s) utilized: Risk manager provided: Pre-op Goals Diet Stage Template Visit summary with goals/ instructions  Learning Readiness: Ready for change   Barriers to learning/ implementing lifestyle change: none  Demonstrated degree of understanding via: Teach Back  Summary: Patient voices readiness to begin making diet and lifestyle changes in effort to lose weight and prepare for bariatric surgery.  Patient's goal for weight loss is reduced health risk and improved energy. She has solid support from family and from friend who underwent bariatric surgery.  She agrees to work on decreasing fat and sugar intake, chewing foods more thoroughly, and continuing to search for appropriate protein supplement options prior to surgery.  She voices understanding of, and is motivated to follow the bariatric diet after surgery.  From a nutrition standpoint, she is ready to proceed with the bariatric surgery program and will be a good candidate for surgery.   Plan: Patient commits to returning for  pre-op class at least 2 weeks prior to surgery.  She will plan to return for post-op MNT visits beginning 2 weeks after surgery.

## 2021-10-22 ENCOUNTER — Ambulatory Visit
Admission: RE | Admit: 2021-10-22 | Discharge: 2021-10-22 | Disposition: A | Discharge status: Home / Self Care | Payer: 59 | Source: Ambulatory Visit | Attending: Surgery | Admitting: Surgery

## 2021-10-22 ENCOUNTER — Other Ambulatory Visit: Payer: Self-pay | Admitting: Surgery

## 2021-10-29 ENCOUNTER — Ambulatory Visit: Payer: 59 | Admitting: Dietician

## 2021-11-15 NOTE — Progress Notes (Unsigned)
Sleep Medicine   Office Visit  Patient Name: Brittney Gill DOB: 12/18/1965 MRN [id number]    Chief Complaint: ***  Brief History:  Brittney presents for initial sleep consult with a *** history of ***. Sleep quality is ***. This is noted *** nights. The patient's bed partner reports  *** at night. The patient relates the following symptoms: *** are also present. The patient goes to sleep at *** and wakes up at ***.   Sleep quality is *** when outside home environment.  Patient has noted *** of her legs at night.  The patient  relates *** behavior during the night.  The patient *** a history of psychiatric problems. The Epworth Sleepiness Score is *** out of 24 .  The patient relates  Cardiovascular risk factors include: *** The patient reports ***    ROS  General: (-) fever, (-) chills, (-) night sweat Nose and Sinuses: (-) nasal stuffiness or itchiness, (-) postnasal drip, (-) nosebleeds, (-) sinus trouble. Mouth and Throat: (-) sore throat, (-) hoarseness. Neck: (-) swollen glands, (-) enlarged thyroid, (-) neck pain. Respiratory: *** cough, *** shortness of breath, *** wheezing. Neurologic: *** numbness, *** tingling. Psychiatric: *** anxiety, *** depression Sleep behavior: ***sleep paralysis ***hypnogogic hallucinations ***dream enactment      ***vivid dreams ***cataplexy ***night terrors ***sleep walking   Current Medication: No outpatient encounter medications on file as of 05/16/2022.   No facility-administered encounter medications on file as of 05/16/2022.    Surgical History: *** The histories are not reviewed yet. Please review them in the "History" navigator section and refresh this SmartLink.  Medical History: No past medical history on file.  Family History: Non contributory to the present illness  Social History: Social History   Socioeconomic History   Marital status: Not on file    Spouse name: Not on file   Number of children: Not on file   Years of  education: Not on file   Highest education level: Not on file  Occupational History   Not on file  Tobacco Use   Smoking status: Not on file   Smokeless tobacco: Not on file  Substance and Sexual Activity   Alcohol use: Not on file   Drug use: Not on file   Sexual activity: Not on file  Other Topics Concern   Not on file  Social History Narrative   Not on file   Social Determinants of Health   Financial Resource Strain: Not on file  Food Insecurity: Not on file  Transportation Needs: Not on file  Physical Activity: Not on file  Stress: Not on file  Social Connections: Not on file  Intimate Partner Violence: Not on file    Vital Signs: There were no vitals taken for this visit. There is no height or weight on file to calculate BMI.   Examination: General Appearance: The patient is well-developed, well-nourished, and in no distress. Neck Circumference: *** Skin: Gross inspection of skin unremarkable. Head: normocephalic, no gross deformities. Eyes: no gross deformities noted. ENT: ears appear grossly normal Neurologic: Alert and oriented. No involuntary movements.    STOP BANG RISK ASSESSMENT S (snore) Have you been told that you snore?     YES/N   T (tired) Are you often tired, fatigued, or sleepy during the day?   YES/NO  O (obstruction) Do you stop breathing, choke, or gasp during sleep? YES/NO   P (pressure) Do you have or are you being treated for high blood pressure? YES/NO   B (  BMI) Is your body index greater than 35 kg/m? YES/NO   A (age) Are you 27 years old or older? YES/NO   N (neck) Do you have a neck circumference greater than 16 inches?   YES/NO   G (gender) Are you a female? YES/NO   TOTAL STOP/BANG "YES" ANSWERS                                                                A STOP-Bang score of 2 or less is considered low risk, and a score of 5 or more is high risk for having either moderate or severe OSA. For people who score 3 or 4,  doctors may need to perform further assessment to determine how likely they are to have OSA.         EPWORTH SLEEPINESS SCALE:  Scale:  (0)= no chance of dozing; (1)= slight chance of dozing; (2)= moderate chance of dozing; (3)= high chance of dozing  Chance  Situtation    Sitting and reading: ***    Watching TV: ***    Sitting Inactive in public: ***    As a passenger in car: ***      Lying down to rest: ***    Sitting and talking: ***    Sitting quielty after lunch: ***    In a car, stopped in traffic: ***   TOTAL SCORE:   *** out of 24    SLEEP STUDIES:  ***   LABS: No results found for this or any previous visit (from the past 2160 hour(s)).  Radiology: Patient was never admitted.  No results found.  No results found.    Assessment and Plan: There are no problems to display for this patient.    PLAN OSA:   Patient evaluation suggests high risk of sleep disordered breathing due to *** Patient has comorbid cardiovascular risk factors including: *** which could be exacerbated by pathologic sleep-disordered breathing.  Suggest: *** to assess/treat the patient's sleep disordered breathing. The patient was also counselled on *** to optimize sleep health.  PLAN hypersomnia:  Patient evaluation suggests significant daytime hypersomnia.  The Epworth Sleepiness Score is elevated at *** out of 24. Patient *** drowsy driving. The patient *** MVA due to sleepiness.  The patient *** restless leg symptoms which exacerbate *** for *** nights per week. The patient *** periodic limb movements which exacerbate ***  for *** nights per week. Suggest: ***  Also suggest ***  PLAN insomnia:  Patient evaluation suggests *** insomnia. This is a chronic disorder. This has been a concern for *** and causes impaired daytime functioning. The patient exhibits comorbid ***  The history *** suggest the insomnia predates the use of hypnotic medications. The symptoms *** with the  discontinuation of these medications. There is no obvious medical, psychiatric or pharmacologic abuse issues ot account for the insomnia.  Treatment recommendations include: *** The patient should maintain a sleep log and calculate total sleep time for 1-2 weeks. Set bed and wake times for achieve 85% sleep efficiency for one week. Once this is achieved  time in bed can be gradually increased. A pharmacologic treatment approach would include a trial of *** for the next ***  months. During this time the patient is to maintain a sleep   diary to track progress.    ***  General Counseling: I have discussed the findings of the evaluation and examination with Brittney.  I have also discussed any further diagnostic evaluation thatmay be needed or ordered today. Brittney verbalizes understanding of the findings of todays visit. We also reviewed her medications today and discussed drug interactions and side effects including but not limited excessive drowsiness and altered mental states. We also discussed that there is always a risk not just to her but also people around her. she has been encouraged to call the office with any questions or concerns that should arise related to todays visit.  No orders of the defined types were placed in this encounter.       I have personally obtained a history, evaluated the patient, evaluated pertinent data, formulated the assessment and plan and placed orders.    Darius Fillingim A Venus Ruhe, MD FCCP Diplomate ABMS Pulmonary and Critical Care Medicine Sleep medicine  

## 2021-11-19 ENCOUNTER — Ambulatory Visit: Payer: Medicaid Other | Admitting: Internal Medicine

## 2021-12-03 ENCOUNTER — Encounter: Payer: Self-pay | Admitting: Skilled Nursing Facility1

## 2021-12-03 ENCOUNTER — Encounter: Payer: Managed Care, Other (non HMO) | Attending: Surgery | Admitting: Skilled Nursing Facility1

## 2021-12-03 NOTE — Progress Notes (Signed)
Pre-Operative Nutrition Class:    Patient was seen on 12/03/2021 for Pre-Operative Bariatric Surgery Education at [facility].    Surgery date:  Surgery type: sleeve Start weight at [facility]: 270 Weight today: 275  Samples given per MNT protocol. Patient educated on appropriate usage:  Bariatric Advantage Multivitamin Lot # [id number] Exp: 08/24   Bariatric Advantage Calcium  Lot # [id number] Exp: 07/04/2022   Protein Shake Lot # [id number] / [id number] Exp: 23 Dec 2021 /  25 Mar 2022  The following the learning objectives were met by the patient during this course: Identify Pre-Op Dietary Goals and will begin 2 weeks pre-operatively Identify appropriate sources of fluids and proteins  State protein recommendations and appropriate sources pre and post-operatively Identify Post-Operative Dietary Goals and will follow for 2 weeks post-operatively Identify appropriate multivitamin and calcium sources Describe the need for physical activity post-operatively and will follow MD recommendations State when to call healthcare provider regarding medication questions or post-operative complications When having a diagnosis of diabetes understanding hypoglycemia symptoms and the inclusion of 1 complex carbohydrate per meal  Handouts given during class include: Pre-Op Bariatric Surgery Diet Handout Protein Shake Handout Post-Op Bariatric Surgery Nutrition Handout [facility] Program Information Flyer Support Group Information Flyer [facility] Bariatric Supplements Price List  Follow-Up Plan: Patient will follow-up at [facility] 2 weeks post operatively for diet advancement per MD.

## 2021-12-12 ENCOUNTER — Ambulatory Visit: Payer: Self-pay | Admitting: Surgery

## 2021-12-12 NOTE — H&P (Signed)
Brittney Gill [id number]    Referring Provider:  Self     Subjective    Chief Complaint: Morbid obesity       History of Present Illness: Follow-up regarding surgical treatment of morbid obesity.  She has completed the bariatric pathway with no barriers identified, and is tentatively scheduled for sleeve gastrectomy on October 17.  Her upper GI did show a small sliding hiatal hernia with small spontaneous gastroesophageal reflux, moderate stool burden in the colon and rectum.  She denies any changes in her health since her initial visit.   Initial visit 10/05/21: 26-year-old woman with history of morbid obesity, sleep apnea, depression, anxiety who presents for surgical consultation regarding treatment of morbid obesity. She has struggled with this for essentially her entire life including childhood.  She does report a family history of obesity, but she is the first and her family to pursue bariatric surgery.  She is here today with her friend Brittney Gill, who had a sleeve through our program in December and is doing quite well. She reports a fairly well-balanced lifestyle, but does note a proclivity towards snacking, sweets, and overeating at dinnertime.  She started working out about 3 months ago.  In the past she has tried various diets as well as phentermine which she had to stop due to side effects. Previous abdominal surgery includes laparoscopic bilateral salpingectomy in May 2022.   She works from home for [employer], she has 3 kids.  She is hoping that aggressive treatment of her obesity will allow her to be a more active and hands-on mother.   Review of Systems: A complete review of systems was obtained from the patient.  I have reviewed this information and discussed as appropriate with the patient.  See HPI as well for other ROS.     Medical History: Past Medical History      Past Medical History:  Diagnosis Date   Anxiety     Sleep apnea             Patient Active Problem  List  Diagnosis   Morbid obesity (CMS-HCC)      Past Surgical History       Past Surgical History:  Procedure Laterality Date   bilateral tubal ligation N/A     broken left arm       FRACTURE SURGERY            Allergies      Allergies  Allergen Reactions   Vit E-Mo-Aloe Vera-Pot [place] Itching   Fluconazole Rash              Current Outpatient Medications on File Prior to Visit  Medication Sig Dispense Refill   ascorbic acid, vitamin C, (VITAMIN C) 500 MG tablet Take by mouth.       aspirin 81 MG EC tablet Take 81 mg by mouth once daily       buPROPion HCl 450 mg Tb24 Take by mouth.       butalbital-acetaminophen-caffeine (FIORICET) 50-325-40 mg capsule Take 1 capsule by mouth every 4 (four) hours as needed for Pain.       fluticasone (FLONASE) 50 mcg/actuation nasal spray Place 1 spray into both nostrils 2 (two) times daily. (Patient not taking: Reported on 03/05/2017) 16 g 0   levonorgestrel (MIRENA) 20 mcg/24 hr (5 years) IUD Insert into the uterus.       naproxen (NAPROSYN) 500 MG tablet Take 1 tablet (500 mg total) by mouth 2 (two) times daily as needed (  pain) (Patient not taking: Reported on 04/10/2018) 30 tablet 0   prenatal vit-iron fum-folic ac (PRENAVITE) tablet Take 1 tablet by mouth once daily (Patient not taking: Reported on 10/05/2021)       ranitidine (ZANTAC) 150 MG tablet Take by mouth. (Patient not taking: Reported on 10/05/2021)       [place], 28, 0.25-35 mg-mcg tablet Take 1 tablet by mouth daily. (Patient not taking: Reported on 10/05/2021)        No current facility-administered medications on file prior to visit.      Family History       Family History  Problem Relation Age of Onset   Obesity Mother     Coronary Artery Disease (Blocked arteries around heart) Maternal Grandmother     Coronary Artery Disease (Blocked arteries around heart) Maternal Grandfather     Breast cancer Paternal Grandmother          Social History        Tobacco Use   Smoking Status Former   Packs/day: 0.25   Years: 0.50   Pack years: 0.13   Types: Cigarettes  Smokeless Tobacco Never      Social History  Social History         Socioeconomic History   Marital status: Married  Tobacco Use   Smoking status: Former      Packs/day: 0.25      Years: 0.50      Pack years: 0.13      Types: Cigarettes   Smokeless tobacco: Never  Substance and Sexual Activity   Alcohol use: Not Currently      Alcohol/week: 0.0 standard drinks      Comment: occasional   Drug use: Never   Sexual activity: Defer  Social History Narrative    Single. Lives with roomate. High school diploma. Works at [employer]. 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 15 minutes of cardio and then weights intermittently.         Objective:      There were no vitals filed for this visit.   There is no height or weight on file to calculate BMI.   Alert and well-appearing Unlabored respirations   Assessment and Plan:  Diagnoses and all orders for this visit:   Morbid obesity (CMS-HCC)   Sleep apnea, unspecified type   History of depression   Anxiety     Initial visit 10/05/2021; 265.4lb 9/20: 272.7lb BMI 30= 180lb    She remains a good candidate for sleeve gastrectomy. We had previously discussed the surgery including technical aspects, the risks of bleeding, infection, pain, scarring, injury to intra-abdominal structures, staple line leak or abscess, chronic abdominal pain or nausea, new onset or worsened GERD, DVT/PE, pneumonia, heart attack, stroke, death, failure to reach weight loss goals and weight regain, hernia.  Discussed the typical peri-, and postoperative course.  Discussed the importance of lifelong behavioral changes to combat the chronic and relapsing disease which is obesity.  She is ready to proceed with surgery next month.     Brittney Gill Raquel James, MD  .

## 2021-12-12 NOTE — H&P (View-Only) (Signed)
Amanda Cockayne [id number]    Referring Provider:  Self     Subjective    Chief Complaint: Morbid obesity       History of Present Illness: Follow-up regarding surgical treatment of morbid obesity.  She has completed the bariatric pathway with no barriers identified, and is tentatively scheduled for sleeve gastrectomy on October 17.  Her upper GI did show a small sliding hiatal hernia with small spontaneous gastroesophageal reflux, moderate stool burden in the colon and rectum.  She denies any changes in her health since her initial visit.   Initial visit 10/05/21: 26 year old woman with history of morbid obesity, sleep apnea, depression, anxiety who presents for surgical consultation regarding treatment of morbid obesity. She has struggled with this for essentially her entire life including childhood.  She does report a family history of obesity, but she is the first and her family to pursue bariatric surgery.  She is here today with her friend Alphonzo Lemmings, who had a sleeve through our program in December and is doing quite well. She reports a fairly well-balanced lifestyle, but does note a proclivity towards snacking, sweets, and overeating at dinnertime.  She started working out about 3 months ago.  In the past she has tried various diets as well as phentermine which she had to stop due to side effects. Previous abdominal surgery includes laparoscopic bilateral salpingectomy in May 2022.   She works from home for [employer], she has 3 kids.  She is hoping that aggressive treatment of her obesity will allow her to be a more active and hands-on mother.   Review of Systems: A complete review of systems was obtained from the patient.  I have reviewed this information and discussed as appropriate with the patient.  See HPI as well for other ROS.     Medical History: Past Medical History      Past Medical History:  Diagnosis Date   Anxiety     Sleep apnea             Patient Active Problem  List  Diagnosis   Morbid obesity (CMS-HCC)      Past Surgical History       Past Surgical History:  Procedure Laterality Date   bilateral tubal ligation N/A     broken left arm       FRACTURE SURGERY            Allergies      Allergies  Allergen Reactions   Vit E-Mo-Aloe Vera-Pot [place] Itching   Fluconazole Rash              Current Outpatient Medications on File Prior to Visit  Medication Sig Dispense Refill   ascorbic acid, vitamin C, (VITAMIN C) 500 MG tablet Take by mouth.       aspirin 81 MG EC tablet Take 81 mg by mouth once daily       buPROPion HCl 450 mg Tb24 Take by mouth.       butalbital-acetaminophen-caffeine (FIORICET) 50-325-40 mg capsule Take 1 capsule by mouth every 4 (four) hours as needed for Pain.       fluticasone (FLONASE) 50 mcg/actuation nasal spray Place 1 spray into both nostrils 2 (two) times daily. (Patient not taking: Reported on 03/05/2017) 16 g 0   levonorgestrel (MIRENA) 20 mcg/24 hr (5 years) IUD Insert into the uterus.       naproxen (NAPROSYN) 500 MG tablet Take 1 tablet (500 mg total) by mouth 2 (two) times daily as needed (  pain) (Patient not taking: Reported on 04/10/2018) 30 tablet 0   prenatal vit-iron fum-folic ac (PRENAVITE) tablet Take 1 tablet by mouth once daily (Patient not taking: Reported on 10/05/2021)       ranitidine (ZANTAC) 150 MG tablet Take by mouth. (Patient not taking: Reported on 10/05/2021)       [place], 28, 0.25-35 mg-mcg tablet Take 1 tablet by mouth daily. (Patient not taking: Reported on 10/05/2021)        No current facility-administered medications on file prior to visit.      Family History       Family History  Problem Relation Age of Onset   Obesity Mother     Coronary Artery Disease (Blocked arteries around heart) Maternal Grandmother     Coronary Artery Disease (Blocked arteries around heart) Maternal Grandfather     Breast cancer Paternal Grandmother          Social History        Tobacco Use   Smoking Status Former   Packs/day: 0.25   Years: 0.50   Pack years: 0.13   Types: Cigarettes  Smokeless Tobacco Never      Social History  Social History         Socioeconomic History   Marital status: Married  Tobacco Use   Smoking status: Former      Packs/day: 0.25      Years: 0.50      Pack years: 0.13      Types: Cigarettes   Smokeless tobacco: Never  Substance and Sexual Activity   Alcohol use: Not Currently      Alcohol/week: 0.0 standard drinks      Comment: occasional   Drug use: Never   Sexual activity: Defer  Social History Narrative    Single. Lives with roomate. High school diploma. Works at [employer]. 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 15 minutes of cardio and then weights intermittently.         Objective:      There were no vitals filed for this visit.   There is no height or weight on file to calculate BMI.   Alert and well-appearing Unlabored respirations   Assessment and Plan:  Diagnoses and all orders for this visit:   Morbid obesity (CMS-HCC)   Sleep apnea, unspecified type   History of depression   Anxiety     Initial visit 10/05/2021; 265.4lb 9/20: 272.7lb BMI 30= 180lb    She remains a good candidate for sleeve gastrectomy. We had previously discussed the surgery including technical aspects, the risks of bleeding, infection, pain, scarring, injury to intra-abdominal structures, staple line leak or abscess, chronic abdominal pain or nausea, new onset or worsened GERD, DVT/PE, pneumonia, heart attack, stroke, death, failure to reach weight loss goals and weight regain, hernia.  Discussed the typical peri-, and postoperative course.  Discussed the importance of lifelong behavioral changes to combat the chronic and relapsing disease which is obesity.  She is ready to proceed with surgery next month.     Sanari Offner Raquel James, MD  .

## 2022-01-01 NOTE — Progress Notes (Addendum)
Anesthesia Review:  PCP: [facility] in [place]  Cardiologist : none  [place] 10/30/21  Chest x-ray : 10/22/21- 2 view  EKG : 10/22/21  Echo : Stress test: Cardiac Cath :  Activity level: can do a flight of stairs without difficutly  Sleep Study/ CPAP : has cpap  Fasting Blood Sugar :      / Checks Blood Sugar -- times a day:   Blood Thinner/ Instructions /Last Dose: ASA / Instructions/ Last Dose :   PT was approx 20 minutes late for preop appt.

## 2022-01-02 NOTE — Patient Instructions (Signed)
SURGICAL WAITING ROOM VISITATION Patients having surgery or a procedure may have no more than 2 support people in the waiting area - these visitors may rotate.   Children under the age of 69 must have an adult with them who is not the patient. If the patient needs to stay at the hospital during part of their recovery, the visitor guidelines for inpatient rooms apply. Pre-op nurse will coordinate an appropriate time for 1 support person to accompany patient in pre-op.  This support person may not rotate.    Please refer to the [facility] website for the visitor guidelines for Inpatients (after your surgery is over and you are in a regular room).       Your procedure is scheduled on 01/08/2022     Report to [facility] Main Entrance    Report to admitting at   1230 pm    Call this number if you have problems the morning of surgery [phone]   Do not eat food :After Midnight.   After Midnight you may have the following liquids until  1130 AM OF SURGERY  Water Non-Citrus Juices (without pulp, NO RED) Carbonated Beverages Black Coffee (NO MILK/CREAM OR CREAMERS, sugar ok)  Clear Tea (NO MILK/CREAM OR CREAMERS, sugar ok) regular and decaf                             Plain Jell-O (NO RED)                                           Fruit ices (not with fruit pulp, NO RED)                                     Popsicles (NO RED)                                                               Sports drinks like Gatorade (NO RED)             The day of surgery:  Drink ONE (1) Pre-Surgery Clear Ensure or G2 at    1130 AM ( have completed by )  the morning of surgery. Drink in one sitting. Do not sip.  This drink was given to you during your hospital  pre-op appointment visit. Nothing else to drink after completing the  Pre-Surgery Clear Ensure or G2.          If you have questions, please contact your surgeon's office.       Oral Hygiene is also important to reduce your risk  of infection.                                    Remember - BRUSH YOUR TEETH THE MORNING OF SURGERY WITH YOUR REGULAR TOOTHPASTE   Do NOT smoke after Midnight   Take these medicines the morning of surgery with A SIP OF WATER:  wellbutrin, omeprazole, zoloft   DO NOT TAKE ANY ORAL  DIABETIC MEDICATIONS DAY OF YOUR SURGERY  Bring CPAP mask and tubing day of surgery.                              You may not have any metal on your body including hair pins, jewelry, and body piercing             Do not wear make-up, lotions, powders, perfumes/cologne, or deodorant  Do not wear nail polish including gel and S&S, artificial/acrylic nails, or any other type of covering on natural nails including finger and toenails. If you have artificial nails, gel coating, etc. that needs to be removed by a nail salon please have this removed prior to surgery or surgery may need to be canceled/ delayed if the surgeon/ anesthesia feels like they are unable to be safely monitored.   Do not shave  48 hours prior to surgery.               Men may shave face and neck.   Do not bring valuables to the hospital. [place].   Contacts, dentures or bridgework may not be worn into surgery.   Bring small overnight bag day of surgery.   DO NOT [place]. PHARMACY WILL DISPENSE MEDICATIONS LISTED ON YOUR MEDICATION LIST TO YOU DURING YOUR ADMISSION [place]!    Patients discharged on the day of surgery will not be allowed to drive home.  Someone NEEDS to stay with you for the first 24 hours after anesthesia.   Special Instructions: Bring a copy of your healthcare power of attorney and living will documents the day of surgery if you haven't scanned them before.              Please read over the following fact sheets you were given: IF [place] [phone]   If you received a  COVID test during your pre-op visit  it is requested that you wear a mask when out in public, stay away from anyone that may not be feeling well and notify your surgeon if you develop symptoms. If you test positive for Covid or have been in contact with anyone that has tested positive in the last 10 days please notify you surgeon.     SURGICAL WAITING ROOM VISITATION Patients having surgery or a procedure may have no more than 2 support people in the waiting area - these visitors may rotate.   Children under the age of 25 must have an adult with them who is not the patient. If the patient needs to stay at the hospital during part of their recovery, the visitor guidelines for inpatient rooms apply. Pre-op nurse will coordinate an appropriate time for 1 support person to accompany patient in pre-op.  This support person may not rotate.    Please refer to the [facility] website for the visitor guidelines for Inpatients (after your surgery is over and you are in a regular room).       Your procedure is scheduled on:    Report to [facility] Main Entrance    Report to admitting at AM   Call this number if you have problems the morning of surgery [phone]   Do not eat food :After Midnight.   After Midnight you  may have the following liquids until ______ AM/ PM DAY OF SURGERY  Water Non-Citrus Juices (without pulp, NO RED) Carbonated Beverages Black Coffee (NO MILK/CREAM OR CREAMERS, sugar ok)  Clear Tea (NO MILK/CREAM OR CREAMERS, sugar ok) regular and decaf                             Plain Jell-O (NO RED)                                           Fruit ices (not with fruit pulp, NO RED)                                     Popsicles (NO RED)                                                               Sports drinks like Gatorade (NO RED)              Drink 2 Ensure/G2 drinks AT 10:00 PM the night before surgery.        The day of surgery:  Drink ONE (1) Pre-Surgery  Clear Ensure or G2 at AM the morning of surgery. Drink in one sitting. Do not sip.  This drink was given to you during your hospital  pre-op appointment visit. Nothing else to drink after completing the  Pre-Surgery Clear Ensure or G2.          If you have questions, please contact your surgeon's office.   FOLLOW BOWEL PREP AND ANY ADDITIONAL PRE OP INSTRUCTIONS YOU RECEIVED FROM YOUR SURGEON'S OFFICE!!!     Oral Hygiene is also important to reduce your risk of infection.                                    Remember - BRUSH YOUR TEETH THE MORNING OF SURGERY WITH YOUR REGULAR TOOTHPASTE   Do NOT smoke after Midnight   Take these medicines the morning of surgery with A SIP OF WATER:   DO NOT TAKE ANY ORAL DIABETIC MEDICATIONS DAY OF YOUR SURGERY  Bring CPAP mask and tubing day of surgery.                              You may not have any metal on your body including hair pins, jewelry, and body piercing             Do not wear make-up, lotions, powders, perfumes/cologne, or deodorant  Do not wear nail polish including gel and S&S, artificial/acrylic nails, or any other type of covering on natural nails including finger and toenails. If you have artificial nails, gel coating, etc. that needs to be removed by a nail salon please have this removed prior to surgery or surgery may need to be canceled/ delayed if the surgeon/ anesthesia feels like they are unable to be safely monitored.   Do not shave  48  hours prior to surgery.               Men may shave face and neck.   Do not bring valuables to the hospital. [place] IS NOT             RESPONSIBLE   FOR VALUABLES.   Contacts, dentures or bridgework may not be worn into surgery.   Bring small overnight bag day of surgery.   DO NOT BRING YOUR HOME MEDICATIONS TO THE HOSPITAL. PHARMACY WILL DISPENSE MEDICATIONS LISTED ON YOUR MEDICATION LIST TO YOU DURING YOUR ADMISSION IN THE HOSPITAL!    Patients discharged on the day of  surgery will not be allowed to drive home.  Someone NEEDS to stay with you for the first 24 hours after anesthesia.   Special Instructions: Bring a copy of your healthcare power of attorney and living will documents the day of surgery if you haven't scanned them before.              Please read over the following fact sheets you were given: IF YOU HAVE QUESTIONS ABOUT YOUR PRE-OP INSTRUCTIONS PLEASE CALL [phone]   If you received a COVID test during your pre-op visit  it is requested that you wear a mask when out in public, stay away from anyone that may not be feeling well and notify your surgeon if you develop symptoms. If you test positive for Covid or have been in contact with anyone that has tested positive in the last 10 days please notify you surgeon.     [place] - Preparing for Surgery Before surgery, you can play an important role.  Because skin is not sterile, your skin needs to be as free of germs as possible.  You can reduce the number of germs on your skin by washing with CHG (chlorahexidine gluconate) soap before surgery.  CHG is an antiseptic cleaner which kills germs and bonds with the skin to continue killing germs even after washing. Please DO NOT use if you have an allergy to CHG or antibacterial soaps.  If your skin becomes reddened/irritated stop using the CHG and inform your nurse when you arrive at Short Stay. Do not shave (including legs and underarms) for at least 48 hours prior to the first CHG shower.  You may shave your face/neck. Please follow these instructions carefully:  1.  Shower with CHG Soap the night before surgery and the  morning of Surgery.  2.  If you choose to wash your hair, wash your hair first as usual with your  normal  shampoo.  3.  After you shampoo, rinse your hair and body thoroughly to remove the  shampoo.                           4.  Use CHG as you would any other liquid soap.  You can apply chg directly  to the skin and wash                        Gently with a scrungie or clean washcloth.  5.  Apply the CHG Soap to your body ONLY FROM THE NECK DOWN.   Do not use on face/ open                           Wound or open sores. Avoid contact with eyes, ears mouth and genitals (  private parts).                       Wash face,  Genitals (private parts) with your normal soap.             6.  Wash thoroughly, paying special attention to the area where your surgery  will be performed.  7.  Thoroughly rinse your body with warm water from the neck down.  8.  DO NOT shower/wash with your normal soap after using and rinsing off  the CHG Soap.                9.  Pat yourself dry with a clean towel.            10.  Wear clean pajamas.            11.  Place clean sheets on your bed the night of your first shower and do not  sleep with pets. Day of Surgery : Do not apply any lotions/deodorants the morning of surgery.  Please wear clean clothes to the hospital/surgery center.  FAILURE TO FOLLOW THESE INSTRUCTIONS MAY RESULT IN THE CANCELLATION OF YOUR SURGERY PATIENT SIGNATURE_________________________________  NURSE SIGNATURE__________________________________  ________________________________________________________________________

## 2022-01-03 ENCOUNTER — Other Ambulatory Visit: Payer: Self-pay

## 2022-01-03 ENCOUNTER — Encounter (HOSPITAL_COMMUNITY): Payer: Self-pay

## 2022-01-03 ENCOUNTER — Encounter (HOSPITAL_COMMUNITY)
Admission: RE | Admit: 2022-01-03 | Discharge: 2022-01-03 | Disposition: A | Discharge status: Home / Self Care | Payer: Managed Care, Other (non HMO) | Source: Ambulatory Visit | Attending: Surgery | Admitting: Surgery

## 2022-01-03 VITALS — BP 138/95 | HR 79 | Temp 98.8°F | Resp 16 | Ht 65.0 in | Wt 283.0 lb

## 2022-01-03 DIAGNOSIS — Z01818 Encounter for other preprocedural examination: Secondary | ICD-10-CM

## 2022-01-03 DIAGNOSIS — Z01812 Encounter for preprocedural laboratory examination: Secondary | ICD-10-CM | POA: Diagnosis present

## 2022-01-03 HISTORY — DX: Gastro-esophageal reflux disease without esophagitis: K21.9

## 2022-01-03 LAB — CBC WITH DIFFERENTIAL/PLATELET
Abs Immature Granulocytes: 0.01 10*3/uL (ref 0.00–0.07)
Basophils Absolute: 0 10*3/uL (ref 0.0–0.1)
Basophils Relative: 1 %
Eosinophils Absolute: 0.1 10*3/uL (ref 0.0–0.5)
Eosinophils Relative: 1 %
HCT: 41 % (ref 36.0–46.0)
Hemoglobin: 12.3 g/dL (ref 12.0–15.0)
Immature Granulocytes: 0 %
Lymphocytes Relative: 34 %
Lymphs Abs: 2.2 10*3/uL (ref 0.7–4.0)
MCH: 24.7 pg — ABNORMAL LOW (ref 26.0–34.0)
MCHC: 30 g/dL (ref 30.0–36.0)
MCV: 82.3 fL (ref 80.0–100.0)
Monocytes Absolute: 0.5 10*3/uL (ref 0.1–1.0)
Monocytes Relative: 7 %
Neutro Abs: 3.7 10*3/uL (ref 1.7–7.7)
Neutrophils Relative %: 57 %
Platelets: 384 10*3/uL (ref 150–400)
RBC: 4.98 MIL/uL (ref 3.87–5.11)
RDW: 15.2 % (ref 11.5–15.5)
WBC: 6.5 10*3/uL (ref 4.0–10.5)
nRBC: 0 % (ref 0.0–0.2)

## 2022-01-03 LAB — COMPREHENSIVE METABOLIC PANEL
ALT: 28 U/L (ref 0–44)
AST: 18 U/L (ref 15–41)
Albumin: 4.3 g/dL (ref 3.5–5.0)
Alkaline Phosphatase: 45 U/L (ref 38–126)
Anion gap: 6 (ref 5–15)
BUN: 10 mg/dL (ref 6–20)
CO2: 24 mmol/L (ref 22–32)
Calcium: 9 mg/dL (ref 8.9–10.3)
Chloride: 108 mmol/L (ref 98–111)
Creatinine, Ser: 0.71 mg/dL (ref 0.44–1.00)
GFR, Estimated: >60 mL/min (ref >60)
Glucose, Bld: 106 mg/dL — ABNORMAL HIGH (ref 70–99)
Potassium: 4 mmol/L (ref 3.5–5.1)
Sodium: 138 mmol/L (ref 135–145)
Total Bilirubin: 0.3 mg/dL (ref 0.3–1.2)
Total Protein: 7.5 g/dL (ref 6.5–8.1)

## 2022-01-08 ENCOUNTER — Encounter (HOSPITAL_COMMUNITY): Payer: Self-pay | Admitting: Surgery

## 2022-01-08 ENCOUNTER — Inpatient Hospital Stay (HOSPITAL_COMMUNITY): Payer: Managed Care, Other (non HMO) | Admitting: Physician Assistant

## 2022-01-08 ENCOUNTER — Encounter (HOSPITAL_COMMUNITY)
Admission: RE | Disposition: A | Discharge status: Home / Self Care | Payer: Self-pay | Source: Ambulatory Visit | Attending: Surgery

## 2022-01-08 ENCOUNTER — Other Ambulatory Visit: Payer: Self-pay

## 2022-01-08 ENCOUNTER — Inpatient Hospital Stay (HOSPITAL_COMMUNITY)
Admission: RE | Admit: 2022-01-08 | Discharge: 2022-01-09 | DRG: 621 | Disposition: A | Discharge status: Home / Self Care | Payer: Managed Care, Other (non HMO) | Source: Ambulatory Visit | Attending: Surgery | Admitting: Surgery

## 2022-01-08 ENCOUNTER — Inpatient Hospital Stay (HOSPITAL_COMMUNITY): Payer: Managed Care, Other (non HMO) | Admitting: Certified Registered Nurse Anesthetist

## 2022-01-08 DIAGNOSIS — F32A Depression, unspecified: Secondary | ICD-10-CM | POA: Diagnosis present

## 2022-01-08 DIAGNOSIS — Z6841 Body Mass Index (BMI) 40.0 and over, adult: Secondary | ICD-10-CM | POA: Diagnosis not present

## 2022-01-08 DIAGNOSIS — Z888 Allergy status to other drugs, medicaments and biological substances status: Secondary | ICD-10-CM | POA: Diagnosis not present

## 2022-01-08 DIAGNOSIS — Z79899 Other long term (current) drug therapy: Secondary | ICD-10-CM | POA: Diagnosis not present

## 2022-01-08 DIAGNOSIS — K219 Gastro-esophageal reflux disease without esophagitis: Secondary | ICD-10-CM | POA: Diagnosis present

## 2022-01-08 DIAGNOSIS — Z803 Family history of malignant neoplasm of breast: Secondary | ICD-10-CM | POA: Diagnosis not present

## 2022-01-08 DIAGNOSIS — F419 Anxiety disorder, unspecified: Secondary | ICD-10-CM | POA: Diagnosis present

## 2022-01-08 DIAGNOSIS — R632 Polyphagia: Secondary | ICD-10-CM | POA: Diagnosis present

## 2022-01-08 DIAGNOSIS — Z87891 Personal history of nicotine dependence: Secondary | ICD-10-CM

## 2022-01-08 DIAGNOSIS — G4730 Sleep apnea, unspecified: Secondary | ICD-10-CM | POA: Diagnosis present

## 2022-01-08 DIAGNOSIS — Z7982 Long term (current) use of aspirin: Secondary | ICD-10-CM

## 2022-01-08 DIAGNOSIS — F418 Other specified anxiety disorders: Secondary | ICD-10-CM | POA: Diagnosis not present

## 2022-01-08 DIAGNOSIS — K449 Diaphragmatic hernia without obstruction or gangrene: Secondary | ICD-10-CM | POA: Diagnosis present

## 2022-01-08 DIAGNOSIS — Z8249 Family history of ischemic heart disease and other diseases of the circulatory system: Secondary | ICD-10-CM | POA: Diagnosis not present

## 2022-01-08 DIAGNOSIS — Z01818 Encounter for other preprocedural examination: Secondary | ICD-10-CM

## 2022-01-08 HISTORY — PX: UPPER GI ENDOSCOPY: SHX6162

## 2022-01-08 HISTORY — PX: LAPAROSCOPIC GASTRIC SLEEVE RESECTION: SHX5895

## 2022-01-08 HISTORY — PX: HIATAL HERNIA REPAIR: SHX195

## 2022-01-08 LAB — POCT PREGNANCY, URINE: Preg Test, Ur: NEGATIVE

## 2022-01-08 LAB — TYPE AND SCREEN
ABO/RH(D): B POS
Antibody Screen: NEGATIVE

## 2022-01-08 SURGERY — GASTRECTOMY, SLEEVE, LAPAROSCOPIC
Anesthesia: General

## 2022-01-08 MED ORDER — HYDROMORPHONE HCL 1 MG/ML IJ SOLN
INTRAMUSCULAR | Status: AC
  Filled 2022-01-08: qty 1

## 2022-01-08 MED ORDER — BUPIVACAINE LIPOSOME 1.3 % IJ SUSP
INTRAMUSCULAR | Status: DC | PRN
  Administered 2022-01-08: 20 mL

## 2022-01-08 MED ORDER — HYDROMORPHONE HCL 1 MG/ML IJ SOLN
0.2500 mg | INTRAMUSCULAR | Status: DC | PRN
  Administered 2022-01-08 (×4): 0.5 mg via INTRAVENOUS

## 2022-01-08 MED ORDER — ENSURE MAX PROTEIN PO LIQD
2.0000 [oz_av] | ORAL | Status: DC
  Administered 2022-01-09 (×2): 2 [oz_av] via ORAL

## 2022-01-08 MED ORDER — ACETAMINOPHEN 500 MG PO TABS
1000.0000 mg | ORAL_TABLET | ORAL | Status: AC
  Administered 2022-01-08: 1000 mg via ORAL
  Filled 2022-01-08: qty 2

## 2022-01-08 MED ORDER — METOCLOPRAMIDE HCL 5 MG/ML IJ SOLN
10.0000 mg | Freq: Four times a day (QID) | INTRAMUSCULAR | Status: DC
  Administered 2022-01-08 – 2022-01-09 (×4): 10 mg via INTRAVENOUS
  Filled 2022-01-08 (×4): qty 2

## 2022-01-08 MED ORDER — HEPARIN SODIUM (PORCINE) 5000 UNIT/ML IJ SOLN
5000.0000 [IU] | INTRAMUSCULAR | Status: AC
  Administered 2022-01-08: 5000 [IU] via SUBCUTANEOUS
  Filled 2022-01-08: qty 1

## 2022-01-08 MED ORDER — DEXAMETHASONE SODIUM PHOSPHATE 4 MG/ML IJ SOLN
INTRAMUSCULAR | Status: DC | PRN
  Administered 2022-01-08: 8 mg via INTRAVENOUS

## 2022-01-08 MED ORDER — ROCURONIUM BROMIDE 10 MG/ML (PF) SYRINGE
PREFILLED_SYRINGE | INTRAVENOUS | Status: AC
  Filled 2022-01-08: qty 10

## 2022-01-08 MED ORDER — PROPOFOL 10 MG/ML IV BOLUS
INTRAVENOUS | Status: DC | PRN
  Administered 2022-01-08: 50 mg via INTRAVENOUS
  Administered 2022-01-08: 200 mg via INTRAVENOUS

## 2022-01-08 MED ORDER — SERTRALINE HCL 100 MG PO TABS
100.0000 mg | ORAL_TABLET | Freq: Every day | ORAL | Status: DC
  Administered 2022-01-09: 100 mg via ORAL
  Filled 2022-01-08: qty 1

## 2022-01-08 MED ORDER — BUPIVACAINE-EPINEPHRINE 0.25% -1:200000 IJ SOLN
INTRAMUSCULAR | Status: DC | PRN
  Administered 2022-01-08: 30 mL

## 2022-01-08 MED ORDER — BUPIVACAINE LIPOSOME 1.3 % IJ SUSP: 20.0000 mL | Freq: Once | INTRAMUSCULAR | Status: DC

## 2022-01-08 MED ORDER — SIMETHICONE 80 MG PO CHEW: 80.0000 mg | CHEWABLE_TABLET | Freq: Four times a day (QID) | ORAL | Status: DC | PRN

## 2022-01-08 MED ORDER — LACTATED RINGERS IV SOLN: INTRAVENOUS | Status: DC

## 2022-01-08 MED ORDER — DOCUSATE SODIUM 100 MG PO CAPS
100.0000 mg | ORAL_CAPSULE | Freq: Two times a day (BID) | ORAL | Status: DC
  Administered 2022-01-08 – 2022-01-09 (×2): 100 mg via ORAL
  Filled 2022-01-08 (×2): qty 1

## 2022-01-08 MED ORDER — BUPROPION HCL ER (XL) 150 MG PO TB24
300.0000 mg | ORAL_TABLET | Freq: Every day | ORAL | Status: DC
  Administered 2022-01-09: 300 mg via ORAL
  Filled 2022-01-08: qty 2

## 2022-01-08 MED ORDER — METHOCARBAMOL 1000 MG/10ML IJ SOLN: 500.0000 mg | Freq: Four times a day (QID) | INTRAVENOUS | Status: DC | PRN

## 2022-01-08 MED ORDER — ONDANSETRON HCL 4 MG/2ML IJ SOLN
INTRAMUSCULAR | Status: AC
  Filled 2022-01-08: qty 2

## 2022-01-08 MED ORDER — HEPARIN SODIUM (PORCINE) 5000 UNIT/ML IJ SOLN
5000.0000 [IU] | Freq: Three times a day (TID) | INTRAMUSCULAR | Status: DC
  Administered 2022-01-08 – 2022-01-09 (×2): 5000 [IU] via SUBCUTANEOUS
  Filled 2022-01-08 (×2): qty 1

## 2022-01-08 MED ORDER — CHLORHEXIDINE GLUCONATE CLOTH 2 % EX PADS: 6.0000 | MEDICATED_PAD | Freq: Once | CUTANEOUS | Status: DC

## 2022-01-08 MED ORDER — APREPITANT 40 MG PO CAPS
40.0000 mg | ORAL_CAPSULE | ORAL | Status: AC
  Administered 2022-01-08: 40 mg via ORAL
  Filled 2022-01-08: qty 1

## 2022-01-08 MED ORDER — MIDAZOLAM HCL 2 MG/2ML IJ SOLN
INTRAMUSCULAR | Status: AC
  Filled 2022-01-08: qty 2

## 2022-01-08 MED ORDER — CHLORHEXIDINE GLUCONATE 0.12 % MT SOLN
15.0000 mL | Freq: Once | OROMUCOSAL | Status: AC
  Administered 2022-01-08: 15 mL via OROMUCOSAL

## 2022-01-08 MED ORDER — LIDOCAINE 2% (20 MG/ML) 5 ML SYRINGE
INTRAMUSCULAR | Status: DC | PRN
  Administered 2022-01-08: 100 mg via INTRAVENOUS

## 2022-01-08 MED ORDER — OXYCODONE HCL 5 MG/5ML PO SOLN
5.0000 mg | Freq: Four times a day (QID) | ORAL | Status: DC | PRN
  Administered 2022-01-08: 5 mg via ORAL
  Filled 2022-01-08: qty 5

## 2022-01-08 MED ORDER — ACETAMINOPHEN 500 MG PO TABS
1000.0000 mg | ORAL_TABLET | Freq: Three times a day (TID) | ORAL | Status: DC
  Administered 2022-01-08 – 2022-01-09 (×2): 1000 mg via ORAL
  Filled 2022-01-08 (×2): qty 2

## 2022-01-08 MED ORDER — FENTANYL CITRATE (PF) 100 MCG/2ML IJ SOLN
INTRAMUSCULAR | Status: DC | PRN
  Administered 2022-01-08: 100 ug via INTRAVENOUS
  Administered 2022-01-08 (×3): 50 ug via INTRAVENOUS

## 2022-01-08 MED ORDER — SODIUM CHLORIDE 0.9 % IV SOLN: INTRAVENOUS | Status: DC

## 2022-01-08 MED ORDER — SODIUM CHLORIDE 0.9 % IV SOLN
2.0000 g | INTRAVENOUS | Status: AC
  Administered 2022-01-08: 2 g via INTRAVENOUS
  Filled 2022-01-08: qty 2

## 2022-01-08 MED ORDER — ORAL CARE MOUTH RINSE: 15.0000 mL | Freq: Once | OROMUCOSAL | Status: AC

## 2022-01-08 MED ORDER — 0.9 % SODIUM CHLORIDE (POUR BTL) OPTIME
TOPICAL | Status: DC | PRN
  Administered 2022-01-08: 1000 mL

## 2022-01-08 MED ORDER — PANTOPRAZOLE SODIUM 40 MG IV SOLR
40.0000 mg | Freq: Every day | INTRAVENOUS | Status: DC
  Administered 2022-01-08: 40 mg via INTRAVENOUS
  Filled 2022-01-08: qty 10

## 2022-01-08 MED ORDER — BUPIVACAINE-EPINEPHRINE (PF) 0.25% -1:200000 IJ SOLN
INTRAMUSCULAR | Status: AC
  Filled 2022-01-08: qty 30

## 2022-01-08 MED ORDER — PROPOFOL 500 MG/50ML IV EMUL
INTRAVENOUS | Status: DC | PRN
  Administered 2022-01-08: 150 ug/kg/min via INTRAVENOUS
  Administered 2022-01-08: 175 ug/kg/min via INTRAVENOUS

## 2022-01-08 MED ORDER — AMISULPRIDE (ANTIEMETIC) 5 MG/2ML IV SOLN
10.0000 mg | Freq: Once | INTRAVENOUS | Status: AC | PRN
  Administered 2022-01-08: 10 mg via INTRAVENOUS

## 2022-01-08 MED ORDER — LIDOCAINE HCL 2 % IJ SOLN
INTRAMUSCULAR | Status: AC
  Filled 2022-01-08: qty 20

## 2022-01-08 MED ORDER — SCOPOLAMINE 1 MG/3DAYS TD PT72
1.0000 | MEDICATED_PATCH | TRANSDERMAL | Status: DC
  Administered 2022-01-08: 1.5 mg via TRANSDERMAL
  Filled 2022-01-08: qty 1

## 2022-01-08 MED ORDER — BUPIVACAINE LIPOSOME 1.3 % IJ SUSP
INTRAMUSCULAR | Status: AC
  Filled 2022-01-08: qty 20

## 2022-01-08 MED ORDER — LACTATED RINGERS IR SOLN
Status: DC | PRN
  Administered 2022-01-08: 1000 mL

## 2022-01-08 MED ORDER — DEXAMETHASONE SODIUM PHOSPHATE 10 MG/ML IJ SOLN
INTRAMUSCULAR | Status: AC
  Filled 2022-01-08: qty 1

## 2022-01-08 MED ORDER — ONDANSETRON HCL 4 MG/2ML IJ SOLN
INTRAMUSCULAR | Status: DC | PRN
  Administered 2022-01-08: 4 mg via INTRAVENOUS

## 2022-01-08 MED ORDER — GABAPENTIN 300 MG PO CAPS
300.0000 mg | ORAL_CAPSULE | ORAL | Status: AC
  Administered 2022-01-08: 300 mg via ORAL
  Filled 2022-01-08: qty 1

## 2022-01-08 MED ORDER — FENTANYL CITRATE (PF) 250 MCG/5ML IJ SOLN
INTRAMUSCULAR | Status: AC
  Filled 2022-01-08: qty 5

## 2022-01-08 MED ORDER — STERILE WATER FOR IRRIGATION IR SOLN
Status: DC | PRN
  Administered 2022-01-08: 1000 mL

## 2022-01-08 MED ORDER — METOPROLOL TARTRATE 5 MG/5ML IV SOLN: 5.0000 mg | Freq: Four times a day (QID) | INTRAVENOUS | Status: DC | PRN

## 2022-01-08 MED ORDER — LIDOCAINE IN D5W 4-5 MG/ML-% IV SOLN
INTRAVENOUS | Status: DC | PRN
  Administered 2022-01-08: 1.5 ug/kg/min via INTRAVENOUS

## 2022-01-08 MED ORDER — MIDAZOLAM HCL 5 MG/5ML IJ SOLN
INTRAMUSCULAR | Status: DC | PRN
  Administered 2022-01-08: 2 mg via INTRAVENOUS

## 2022-01-08 MED ORDER — GABAPENTIN 100 MG PO CAPS
200.0000 mg | ORAL_CAPSULE | Freq: Two times a day (BID) | ORAL | Status: DC
  Administered 2022-01-08 – 2022-01-09 (×2): 200 mg via ORAL
  Filled 2022-01-08 (×2): qty 2

## 2022-01-08 MED ORDER — HYDRALAZINE HCL 20 MG/ML IJ SOLN: 10.0000 mg | INTRAMUSCULAR | Status: DC | PRN

## 2022-01-08 MED ORDER — PROPOFOL 10 MG/ML IV BOLUS
INTRAVENOUS | Status: AC
  Filled 2022-01-08: qty 20

## 2022-01-08 MED ORDER — SUGAMMADEX SODIUM 500 MG/5ML IV SOLN
INTRAVENOUS | Status: DC | PRN
  Administered 2022-01-08: 246.2 mg via INTRAVENOUS

## 2022-01-08 MED ORDER — TRAMADOL HCL 50 MG PO TABS
50.0000 mg | ORAL_TABLET | Freq: Four times a day (QID) | ORAL | Status: DC | PRN
  Administered 2022-01-09 (×2): 50 mg via ORAL
  Filled 2022-01-08 (×2): qty 1

## 2022-01-08 MED ORDER — ACETAMINOPHEN 160 MG/5ML PO SOLN: 1000.0000 mg | Freq: Three times a day (TID) | ORAL | Status: DC

## 2022-01-08 MED ORDER — ROCURONIUM BROMIDE 10 MG/ML (PF) SYRINGE
PREFILLED_SYRINGE | INTRAVENOUS | Status: DC | PRN
  Administered 2022-01-08: 20 mg via INTRAVENOUS
  Administered 2022-01-08: 70 mg via INTRAVENOUS

## 2022-01-08 MED ORDER — HYDROMORPHONE HCL 1 MG/ML IJ SOLN: 0.5000 mg | INTRAMUSCULAR | Status: DC | PRN

## 2022-01-08 MED ORDER — AMISULPRIDE (ANTIEMETIC) 5 MG/2ML IV SOLN
INTRAVENOUS | Status: AC
  Filled 2022-01-08: qty 4

## 2022-01-08 MED ORDER — ONDANSETRON HCL 4 MG/2ML IJ SOLN: 4.0000 mg | INTRAMUSCULAR | Status: DC | PRN

## 2022-01-08 MED ORDER — PROPOFOL 1000 MG/100ML IV EMUL
INTRAVENOUS | Status: AC
  Filled 2022-01-08: qty 100

## 2022-01-08 SURGICAL SUPPLY — 67 items
APPLIER CLIP ROT 10 11.4 M/L (STAPLE) IMPLANT
APPLIER CLIP ROT 13.4 12 LRG (CLIP) ×2 IMPLANT
BENZOIN TINCTURE PRP APPL 2/3 (GAUZE/BANDAGES/DRESSINGS) ×2 IMPLANT
BLADE SURG SZ11 CARB STEEL (BLADE) ×2 IMPLANT
BNDG ADH 1X3 SHEER STRL LF (GAUZE/BANDAGES/DRESSINGS) ×12 IMPLANT
CHLORAPREP W/TINT 26 (MISCELLANEOUS) ×4 IMPLANT
CLIP APPLIE ROT 10 11.4 M/L (STAPLE) IMPLANT
CLIP APPLIE ROT 13.4 12 LRG (CLIP) IMPLANT
COVER SURGICAL LIGHT HANDLE (MISCELLANEOUS) ×2 IMPLANT
DEVICE SUT QUICK LOAD TK 5 (SUTURE) IMPLANT
DEVICE SUT TI-KNOT TK 5X26 (SUTURE) IMPLANT
DEVICE SUTURE ENDOST 10MM (ENDOMECHANICALS) IMPLANT
DRAPE UTILITY XL STRL (DRAPES) ×4 IMPLANT
ELECT REM PT RETURN 15FT ADLT (MISCELLANEOUS) ×2 IMPLANT
GAUZE SPONGE 4X4 12PLY STRL (GAUZE/BANDAGES/DRESSINGS) IMPLANT
GLOVE BIO SURGEON STRL SZ 6 (GLOVE) ×2 IMPLANT
GLOVE INDICATOR 6.5 STRL GRN (GLOVE) ×2 IMPLANT
GLOVE SS BIOGEL STRL SZ 6 (GLOVE) ×2 IMPLANT
GOWN STRL REUS W/ TWL LRG LVL3 (GOWN DISPOSABLE) ×2 IMPLANT
GOWN STRL REUS W/ TWL XL LVL3 (GOWN DISPOSABLE) IMPLANT
GOWN STRL REUS W/TWL LRG LVL3 (GOWN DISPOSABLE) ×2
GOWN STRL REUS W/TWL XL LVL3 (GOWN DISPOSABLE)
GRASPER SUT TROCAR 14GX15 (MISCELLANEOUS) ×2 IMPLANT
IRRIG SUCT STRYKERFLOW 2 WTIP (MISCELLANEOUS) ×2 IMPLANT
IRRIGATION SUCT STRKRFLW 2 WTP (MISCELLANEOUS) ×2 IMPLANT
KIT BASIN OR (CUSTOM PROCEDURE TRAY) ×2 IMPLANT
KIT TURNOVER KIT A (KITS) IMPLANT
MARKER SKIN DUAL TIP RULER LAB (MISCELLANEOUS) ×2 IMPLANT
MAT PREVALON FULL STRYKER (MISCELLANEOUS) ×2 IMPLANT
NDL SPNL 22GX3.5 QUINCKE BK (NEEDLE) ×2 IMPLANT
NEEDLE SPNL 22GX3.5 QUINCKE BK (NEEDLE) ×2 IMPLANT
PACK UNIVERSAL I (CUSTOM PROCEDURE TRAY) ×2 IMPLANT
RELOAD ENDO STITCH (ENDOMECHANICALS) IMPLANT
RELOAD STAPLE 60 3.6 BLU REG (STAPLE) ×2 IMPLANT
RELOAD STAPLE 60 3.8 GOLD REG (STAPLE) ×2 IMPLANT
RELOAD STAPLE 60 4.1 GRN THCK (STAPLE) IMPLANT
RELOAD STAPLER BLUE 60MM (STAPLE) ×14 IMPLANT
RELOAD STAPLER GOLD 60MM (STAPLE) ×2 IMPLANT
RELOAD STAPLER GREEN 60MM (STAPLE) IMPLANT
RELOAD SUT TRIPLE-STITCH 2-0 (ENDOMECHANICALS) IMPLANT
SCISSORS LAP 5X45 EPIX DISP (ENDOMECHANICALS) ×2 IMPLANT
SET TUBE SMOKE EVAC HIGH FLOW (TUBING) ×2 IMPLANT
SHEARS HARMONIC ACE PLUS 45CM (MISCELLANEOUS) ×2 IMPLANT
SLEEVE ADV FIXATION 5X100MM (TROCAR) ×4 IMPLANT
SLEEVE GASTRECTOMY 40FR VISIGI (MISCELLANEOUS) ×2 IMPLANT
SOL ANTI FOG 6CC (MISCELLANEOUS) ×2 IMPLANT
SOLUTION ANTI FOG 6CC (MISCELLANEOUS) ×2
STAPLE LINE REINFORCEMENT LAP (STAPLE) IMPLANT
STAPLER ECHELON LONG 60 440 (INSTRUMENTS) ×2 IMPLANT
STAPLER RELOAD BLUE 60MM (STAPLE) ×14 IMPLANT
STAPLER RELOAD GOLD 60MM (STAPLE) ×2 IMPLANT
STAPLER RELOAD GREEN 60MM (STAPLE) IMPLANT
STRIP CLOSURE SKIN 1/2X4 (GAUZE/BANDAGES/DRESSINGS) ×2 IMPLANT
SUT MNCRL AB 4-0 PS2 18 (SUTURE) ×2 IMPLANT
SUT SURGIDAC NAB ES-9 0 48 120 (SUTURE) IMPLANT
SUT VICRYL 0 TIES 12 18 (SUTURE) ×2 IMPLANT
SYR 10ML ECCENTRIC (SYRINGE) ×2 IMPLANT
SYR 20ML LL LF (SYRINGE) ×2 IMPLANT
SYR 50ML LL SCALE MARK (SYRINGE) ×2 IMPLANT
SYS KII OPTICAL ACCESS 15MM (TROCAR) ×2 IMPLANT
SYSTEM KII OPTICAL ACCESS 15MM (TROCAR) ×2 IMPLANT
TOWEL OR 17X26 10 PK STRL BLUE (TOWEL DISPOSABLE) ×2 IMPLANT
TOWEL OR NON WOVEN STRL DISP B (DISPOSABLE) ×2 IMPLANT
TROCAR ADV FIXATION 5X100MM (TROCAR) ×2 IMPLANT
TROCAR XCEL NON-BLD 5MMX100MML (ENDOMECHANICALS) ×2 IMPLANT
TUBING CONNECTING 10 (TUBING) ×2 IMPLANT
TUBING ENDO SMARTCAP (MISCELLANEOUS) ×2 IMPLANT

## 2022-01-08 NOTE — Progress Notes (Signed)
PHARMACY CONSULT FOR:  Risk Assessment for Post-Discharge VTE Following Bariatric Surgery  Post-Discharge VTE Risk Assessment: This patient's probability of 30-day post-discharge VTE is increased due to the factors marked: x Sleeve gastrectomy   Liver disorder (transplant, cirrhosis, or nonalcoholic steatohepatitis)   Hx of VTE   Hemorrhage requiring transfusion   GI perforation, leak, or obstruction   ====================================================    Female    Age >/=60 years    BMI >/=50 kg/m2    CHF    Dyspnea at Rest    Paraplegia   x Non-gastric-band surgery    Operation Time >/=3 hr    Return to OR     Length of Stay >/= 3 d   Hypercoagulable condition   Significant venous stasis      Predicted probability of 30-day post-discharge VTE: 0.16%  Other patient-specific factors to consider: N/A   Recommendation for Discharge: No pharmacologic prophylaxis post-discharge   AAVA DELAND is a 26 y.o. female who underwent laparoscopic sleeve gastrectomy on 01/08/2022.    Case start: 1425 Case end: 1528   Allergies  Allergen Reactions   Other Itching    Vitamin E cream   Diflucan [Fluconazole] Rash    Patient Measurements: Height: 5\' 5"  (165.1 cm) Weight: 123.1 kg (271 lb 6.4 oz) IBW/kg (Calculated) : 57 Body mass index is 45.16 kg/m.  No results for input(s): "WBC", "HGB", "HCT", "PLT", "APTT", "CREATININE", "LABCREA", "CREAT24HRUR", "MG", "PHOS", "ALBUMIN", "PROT", "AST", "ALT", "ALKPHOS", "BILITOT", "BILIDIR", "IBILI" in the last 72 hours. Estimated Creatinine Clearance: 139.1 mL/min (by C-G formula based on SCr of 0.71 mg/dL).    Past Medical History:  Diagnosis Date   Anxiety    Depression    GERD (gastroesophageal reflux disease)    Pregnancy occurring while using intrauterine contraceptive device (IUD) 02/02/2018   Sleep apnea    cpap     Medications Prior to Admission  Medication Sig Dispense Refill Last Dose   buPROPion  (WELLBUTRIN XL) 300 MG 24 hr tablet Take 300 mg by mouth daily.   01/07/2022   Multiple Vitamin (MULTIVITAMIN WITH MINERALS) TABS tablet Take 1 tablet by mouth daily.   01/01/2022   NON FORMULARY Pt uses a cpap nightly   01/07/2022   omeprazole (PRILOSEC OTC) 20 MG tablet Take 20 mg by mouth daily as needed (acid reflux).   01/06/2022   sertraline (ZOLOFT) 100 MG tablet Take 100 mg by mouth daily.   01/07/2022       Luiz Ochoa 01/08/2022,5:25 PM

## 2022-01-08 NOTE — Op Note (Signed)
Preoperative diagnosis: laparoscopic sleeve gastrectomy  Postoperative diagnosis: Same   Procedure: Upper endoscopy   Surgeon: Gurney Maxin, M.D.  Anesthesia: Gen.   Indications for procedure: This patient was undergoing a laparoscopic sleeve gastrectomy.   Description of procedure: The endoscopy was placed in the mouth and into the oropharynx and under endoscopic vision it was advanced to the esophagogastric junction.  The stomach was insufflated and no bleeding or bubbles were seen.  The GEJ was identified at 39cm from the teeth. No bleeding or leaks were detected. The scope was withdrawn without difficulty.    Gurney Maxin, M.D. General, Bariatric, & Minimally Invasive Surgery [facility]

## 2022-01-08 NOTE — Anesthesia Postprocedure Evaluation (Signed)
Anesthesia Post Note  Patient: Brittney Gill  Procedure(s) Performed: LAPAROSCOPIC SLEEVE GASTRECTOMY UPPER GI ENDOSCOPY LAPAROSCOPIC REPAIR OF HIATAL HERNIA     Patient location during evaluation: PACU Anesthesia Type: General Level of consciousness: awake and alert Pain management: pain level controlled Vital Signs Assessment: post-procedure vital signs reviewed and stable Respiratory status: spontaneous breathing, nonlabored ventilation and respiratory function stable Cardiovascular status: blood pressure returned to baseline Postop Assessment: no apparent nausea or vomiting Anesthetic complications: no   No notable events documented.  Last Vitals:  Vitals:   01/08/22 1615 01/08/22 1630  BP: (!) 180/102 (!) 167/86  Pulse: 82 72  Resp: 15   Temp:    SpO2: 100% 93%    Last Pain:  Vitals:   01/08/22 1630  TempSrc:   PainSc: Asleep                 Marthenia Rolling

## 2022-01-08 NOTE — Anesthesia Procedure Notes (Signed)
Procedure Name: Intubation Date/Time: 01/08/2022 2:09 PM  Performed by: Deliah Boston, CRNAPre-anesthesia Checklist: Patient identified, Emergency Drugs available, Suction available and Patient being monitored Patient Re-evaluated:Patient Re-evaluated prior to induction Oxygen Delivery Method: Circle system utilized Preoxygenation: Pre-oxygenation with 100% oxygen Induction Type: IV induction Ventilation: Mask ventilation without difficulty Laryngoscope Size: Mac and 3 Grade View: Grade I Tube type: Oral Number of attempts: 1 Airway Equipment and Method: Stylet Placement Confirmation: ETT inserted through vocal cords under direct vision, positive ETCO2 and breath sounds checked- equal and bilateral Secured at: 22 cm Tube secured with: Tape Dental Injury: Teeth and Oropharynx as per pre-operative assessment

## 2022-01-08 NOTE — Transfer of Care (Signed)
Immediate Anesthesia Transfer of Care Note  Patient: Brittney Gill  Procedure(s) Performed: LAPAROSCOPIC SLEEVE GASTRECTOMY UPPER GI ENDOSCOPY LAPAROSCOPIC REPAIR OF HIATAL HERNIA  Patient Location: PACU  Anesthesia Type:General  Level of Consciousness: oriented, drowsy and patient cooperative  Airway & Oxygen Therapy: Patient Spontanous Breathing and Patient connected to face mask oxygen  Post-op Assessment: Report given to RN and Post -op Vital signs reviewed and stable  Post vital signs: Reviewed  Last Vitals:  Vitals Value Taken Time  BP 149/91 01/08/22 1545  Temp    Pulse 90 01/08/22 1547  Resp 12 01/08/22 1547  SpO2 98 % 01/08/22 1547  Vitals shown include unvalidated device data.  Last Pain:  Vitals:   01/08/22 1258  TempSrc:   PainSc: 0-No pain         Complications: No notable events documented.

## 2022-01-08 NOTE — Op Note (Signed)
Operative Note  MYKIRA HOFMEISTER  [id number]  [id number]  01/08/2022   Surgeon: Romana Juniper MD FACS   Assistant: Gurney Maxin MD FACS   Procedure performed: laparoscopic sleeve gastrectomy, hiatal hernia repair, upper endoscopy   Preop diagnosis: Morbid obesity Body mass index is 45.16 kg/m. Post-op diagnosis/intraop findings: same   Specimens: fundus Retained items: none  EBL: minimal  Complications: none   Description of procedure: After obtaining informed consent and administration of chemical DVT prophylaxis in holding, the patient was taken to the operating room and placed supine on operating room table where general endotracheal anesthesia was initiated, preoperative antibiotics were administered, SCDs applied, and a formal timeout was performed. The abdomen was prepped and draped in usual sterile fashion. Peritoneal access was gained using a Visiport technique in the left upper quadrant and insufflation to 15 mmHg ensued without issue. Gross inspection revealed no evidence of injury. Under direct visualization three more 5 mm trochars were placed in the right and left hemiabdomen and the 46mm trocar in the right paramedian upper abdomen. Bilateral laparoscopic assisted TAPS blocks were performed with Exparel diluted with 0.25 percent Marcaine with epinephrine. The patient was placed in steep reverse Trendelenburg and the liver retractor was introduced through an incision in the upper midline and secured to the post externally to maintain the left lobe retracted anteriorly.  There was a small hiatal hernia on direct inspection.  The pars lucida was entered with Harmonic scalpel and the posterior aspect of the right and left crus were dissected out using Harmonic and blunt dissection. The hiatus was narrowed to the appropriate diameter with a single suture of 0 Ethibond secured with the ty-knot device.   Using the Harmonic scalpel, the greater curvature of the stomach was dissected  away from the greater omentum and short gastric vessels were divided. This began 6 cm from the pylorus, and dissection proceeded until the left crus was clearly exposed. There were some filmy adhesions of the posterior stomach to the retroperitoneum which were divided with the Harmonic. Esophageal fat pad was mobilized off the anterior stomach slightly. The 53 [place] VisiGi was then introduced and directed down towards the pylorus. This was placed to suction against the lesser curve. Serial fires of the linear cutting stapler with staple line reinforcements were then employed to create our sleeve. The first fire used a gold load and ensured adequate room at the angularis incisura. Several blue loads were then employed to create an evenly tubular stomach, preserving 1 cm lateral to the angle of His. The excised stomach was then removed through our 15 mm trocar site within an Endo Catch bag.  The visigi was taken off of suction and a few puffs of air were introduced, inflating the sleeve. No bubbles were observed in the irrigation fluid around the stomach and the shape was noted to be evenly tubular without any narrowing at the angularis. The visigi was then removed.  At this point the insufflation pressure was reduced to 8 mmHg for the remainder of the case.  Upper endoscopy was performed by the assistant surgeon and the sleeve was noted to be airtight, the staple line was hemostatic. Please see his separate note. The endoscope was removed. A small amount of oozing on the distal to staple lines was addressed with clips. The 15 mm trocar site fascia in the right upper abdomen was closed with a 0 Vicryl using the laparoscopic suture passer under direct visualization. The liver retractor was removed under direct visualization. The  abdomen was then desufflated and all remaining trochars removed. The skin incisions were closed with subcuticular 4-0 Monocryl; benzoin, Steri-Strips and Band-Aids were applied The patient  was then awakened, extubated and taken to PACU in stable condition.     All counts were correct at the completion of the case.

## 2022-01-08 NOTE — Progress Notes (Signed)
Patient seen in Short Stay prior to surgery.  Discussed QI "Goals for Discharge" document with patient including ambulation in halls, Incentive Spirometry use every hour, and oral care.  Also discussed pain and nausea control.  BSTOP education provided including BSTOP information guide, "Guide for Pain Management after your Bariatric Procedure".  Diet progression education provided including "Bariatric Surgery Post-Op Food Plan Phase 1: Liquids".  Questions answered.  Will continue to partner with bedside RN and follow up with patient per protocol after surgery complete.  

## 2022-01-08 NOTE — Anesthesia Preprocedure Evaluation (Addendum)
Anesthesia Evaluation  Patient identified by MRN, date of birth, ID band Patient awake    Reviewed: Allergy & Precautions, NPO status , Patient's Chart, lab work & pertinent test results  History of Anesthesia Complications Negative for: history of anesthetic complications  Airway Mallampati: II  TM Distance: >3 FB Neck ROM: Full    Dental no notable dental hx.    Pulmonary sleep apnea , former smoker,    Pulmonary exam normal        Cardiovascular negative cardio ROS Normal cardiovascular exam     Neuro/Psych Anxiety Depression negative neurological ROS     GI/Hepatic Neg liver ROS, GERD  Medicated and Controlled,  Endo/Other  Morbid obesity (BMI 45)  Renal/GU negative Renal ROS  negative genitourinary   Musculoskeletal negative musculoskeletal ROS (+)   Abdominal   Peds  Hematology negative hematology ROS (+)   Anesthesia Other Findings Day of surgery medications reviewed with patient.  Reproductive/Obstetrics negative OB ROS                            Anesthesia Physical Anesthesia Plan  ASA: 3  Anesthesia Plan: General   Post-op Pain Management: Tylenol PO (pre-op)*   Induction: Intravenous  PONV Risk Score and Plan: 3 and Treatment may vary due to age or medical condition, Ondansetron, Dexamethasone, Midazolam and Scopolamine patch - Pre-op  Airway Management Planned: Oral ETT  Additional Equipment: None  Intra-op Plan:   Post-operative Plan: Extubation in OR  Informed Consent: I have reviewed the patients History and Physical, chart, labs and discussed the procedure including the risks, benefits and alternatives for the proposed anesthesia with the patient or authorized representative who has indicated his/her understanding and acceptance.     Dental advisory given  Plan Discussed with: CRNA  Anesthesia Plan Comments:        Anesthesia Quick Evaluation

## 2022-01-08 NOTE — Interval H&P Note (Signed)
History and Physical Interval Note:  01/08/2022 1:21 PM  Brittney Gill  has presented today for surgery, with the diagnosis of MORBID OBESITY.  The various methods of treatment have been discussed with the patient and family. After consideration of risks, benefits and other options for treatment, the patient has consented to  Procedure(s): LAPAROSCOPIC SLEEVE GASTRECTOMY (N/A) UPPER GI ENDOSCOPY (N/A) as a surgical intervention.  The patient's history has been reviewed, patient examined, no change in status, stable for surgery.  I have reviewed the patient's chart and labs.  Questions were answered to the patient's satisfaction.     Akemi Overholser Rich Brave

## 2022-01-09 ENCOUNTER — Other Ambulatory Visit (HOSPITAL_COMMUNITY): Payer: Self-pay

## 2022-01-09 ENCOUNTER — Encounter (HOSPITAL_COMMUNITY): Payer: Self-pay | Admitting: Surgery

## 2022-01-09 LAB — COMPREHENSIVE METABOLIC PANEL
ALT: 24 U/L (ref 0–44)
AST: 19 U/L (ref 15–41)
Albumin: 3.7 g/dL (ref 3.5–5.0)
Alkaline Phosphatase: 40 U/L (ref 38–126)
Anion gap: 6 (ref 5–15)
BUN: 7 mg/dL (ref 6–20)
CO2: 23 mmol/L (ref 22–32)
Calcium: 8.6 mg/dL — ABNORMAL LOW (ref 8.9–10.3)
Chloride: 108 mmol/L (ref 98–111)
Creatinine, Ser: 0.56 mg/dL (ref 0.44–1.00)
GFR, Estimated: >60 mL/min (ref >60)
Glucose, Bld: 112 mg/dL — ABNORMAL HIGH (ref 70–99)
Potassium: 4 mmol/L (ref 3.5–5.1)
Sodium: 137 mmol/L (ref 135–145)
Total Bilirubin: 0.4 mg/dL (ref 0.3–1.2)
Total Protein: 6.8 g/dL (ref 6.5–8.1)

## 2022-01-09 LAB — CBC
HCT: 37.2 % (ref 36.0–46.0)
Hemoglobin: 11.2 g/dL — ABNORMAL LOW (ref 12.0–15.0)
MCH: 24.6 pg — ABNORMAL LOW (ref 26.0–34.0)
MCHC: 30.1 g/dL (ref 30.0–36.0)
MCV: 81.8 fL (ref 80.0–100.0)
Platelets: 379 10*3/uL (ref 150–400)
RBC: 4.55 MIL/uL (ref 3.87–5.11)
RDW: 15.1 % (ref 11.5–15.5)
WBC: 12.9 10*3/uL — ABNORMAL HIGH (ref 4.0–10.5)
nRBC: 0 % (ref 0.0–0.2)

## 2022-01-09 LAB — MAGNESIUM: Magnesium: 1.9 mg/dL (ref 1.7–2.4)

## 2022-01-09 MED ORDER — TRAMADOL HCL 50 MG PO TABS
50.0000 mg | ORAL_TABLET | Freq: Four times a day (QID) | ORAL | 0 refills | Status: DC | PRN
  Filled 2022-01-09: qty 10, 3d supply, fill #0

## 2022-01-09 MED ORDER — PANTOPRAZOLE SODIUM 40 MG PO TBEC
40.0000 mg | DELAYED_RELEASE_TABLET | Freq: Every day | ORAL | 0 refills | Status: DC
  Filled 2022-01-09: qty 90, 90d supply, fill #0

## 2022-01-09 MED ORDER — ACETAMINOPHEN 500 MG PO TABS: 1000.0000 mg | ORAL_TABLET | Freq: Three times a day (TID) | ORAL | 0 refills | Status: AC

## 2022-01-09 MED ORDER — DOCUSATE SODIUM 100 MG PO CAPS
100.0000 mg | ORAL_CAPSULE | Freq: Two times a day (BID) | ORAL | 0 refills | Status: AC
  Filled 2022-01-09: qty 30, 15d supply, fill #0

## 2022-01-09 MED ORDER — ONDANSETRON 4 MG PO TBDP
4.0000 mg | ORAL_TABLET | Freq: Four times a day (QID) | ORAL | 0 refills | Status: AC | PRN
  Filled 2022-01-09: qty 20, 5d supply, fill #0

## 2022-01-09 MED ORDER — GABAPENTIN 100 MG PO CAPS
200.0000 mg | ORAL_CAPSULE | Freq: Two times a day (BID) | ORAL | 0 refills | Status: DC
  Filled 2022-01-09: qty 20, 5d supply, fill #0

## 2022-01-09 NOTE — Plan of Care (Signed)
  Problem: Coping: Goal: Level of anxiety will decrease Outcome: Progressing   Problem: Pain Managment: Goal: General experience of comfort will improve Outcome: Progressing   

## 2022-01-09 NOTE — Progress Notes (Signed)
Patient alert and oriented, pain is controlled. Patient is tolerating fluids, advanced to protein shake today, patient is tolerating well. Reviewed Gastric sleeve discharge instructions with patient and patient is able to articulate understanding. Provided information on BELT program, Support Group and [facility] outpatient pharmacy. Communicated general update of patient status to surgeon. All questions answered. 24hr fluid recall is 420 per hydration protocol, bariatric nurse coordinator to make follow-up phone call within one week.

## 2022-01-09 NOTE — Discharge Summary (Signed)
Physician Discharge Summary  Brittney Gill GOT:[id number] DOB: [birth year] DOA: 01/08/2022  PCP: [place] date: 01/08/2022 Discharge date: 01/09/2022  Recommendations for Outpatient Follow-up:    Follow-up Information     Clovis Riley, MD. Go on 02/07/2022.   Specialty: General Surgery Why: 10:20am. Please arrive 15 minutes prior to your appointment time. Thank you. Contact information: [address] [place] [address] [phone]         Clovis Riley, MD. Go on 02/26/2022.   Specialty: General Surgery Why: at 4:10pm. Please arrive 15 minutes prior to your appointment time. Thank you. Contact information: [address] [place] [address] [phone]                Discharge Diagnoses:  Principal Problem:   Morbid obesity ([place])   Surgical Procedure: Laparoscopic Sleeve Gastrectomy, upper endoscopy  Discharge Condition: Good Disposition: Home  Diet recommendation: Postoperative sleeve gastrectomy diet (liquids only)  Filed Weights   01/08/22 1248  Weight: 123.1 kg     Hospital Course:  The patient was admitted for a planned laparoscopic sleeve gastrectomy. Please see operative note. Preoperatively the patient was given 5000 units of subcutaneous heparin for DVT prophylaxis. Postoperative prophylactic heparin dosing was started on the evening of postoperative day 0. ERAS protocol was used. On the evening of postoperative day 0, the patient was started on water and ice chips. On postoperative day 1 the patient had no fever or tachycardia and was tolerating water in their diet was gradually advanced throughout the day. The patient was ambulating without difficulty. Their vital signs are stable without fever or tachycardia. Their hemoglobin had remained stable. The patient was maintained on their home settings for CPAP therapy. The patient had received discharge instructions and  counseling. They were deemed stable for discharge and had met discharge criteria   Discharge Instructions   Allergies as of 01/09/2022       Reactions   Other Itching   Vitamin E cream   Diflucan [fluconazole] Rash        Medication List     STOP taking these medications    omeprazole 20 MG tablet Commonly known as: PRILOSEC OTC       TAKE these medications    acetaminophen 500 MG tablet Commonly known as: TYLENOL Take 2 tablets (1,000 mg total) by mouth every 8 (eight) hours for 5 days.   buPROPion 300 MG 24 hr tablet Commonly known as: WELLBUTRIN XL Take 300 mg by mouth daily.   docusate sodium 100 MG capsule Commonly known as: Colace Take 1 capsule (100 mg total) by mouth 2 (two) times daily. Okay to decrease to once daily or stop taking if having loose bowel movements   gabapentin 100 MG capsule Commonly known as: NEURONTIN Take 2 capsules (200 mg total) by mouth every 12 (twelve) hours.   multivitamin with minerals Tabs tablet Take 1 tablet by mouth daily.   NON FORMULARY Pt uses a cpap nightly   ondansetron 4 MG disintegrating tablet Commonly known as: ZOFRAN-ODT Dissolve 1 tablet (4 mg total) by mouth every 6 (six) hours as needed for nausea or vomiting.   pantoprazole 40 MG tablet Commonly known as: PROTONIX Take 1 tablet (40 mg total) by mouth daily. Take this medication daily, regardless of reflux symptoms, for the first 90 days after surgery.   sertraline 100 MG tablet Commonly known as: ZOLOFT Take 100 mg by mouth daily.  traMADol 50 MG tablet Commonly known as: ULTRAM Take 1 tablet (50 mg total) by mouth every 6 (six) hours as needed (pain).        Follow-up Information     Clovis Riley, MD. Go on 02/07/2022.   Specialty: General Surgery Why: 10:20am. Please arrive 15 minutes prior to your appointment time. Thank you. Contact information: [address] [place] [address] [phone]          Clovis Riley, MD. Go on 02/26/2022.   Specialty: General Surgery Why: at 4:10pm. Please arrive 15 minutes prior to your appointment time. Thank you. Contact information: [address] [place] [address] [phone]                  The results of significant diagnostics from this hospitalization (including imaging, microbiology, ancillary and laboratory) are listed below for reference.    Significant Diagnostic Studies: No results found.  Labs: Basic Metabolic Panel: Recent Labs  Lab 01/03/22 0840 01/09/22 0448  NA 138 137  K 4.0 4.0  CL 108 108  CO2 24 23  GLUCOSE 106* 112*  BUN 10 7  CREATININE 0.71 0.56  CALCIUM 9.0 8.6*  MG  --  1.9   Liver Function Tests: Recent Labs  Lab 01/03/22 0840 01/09/22 0448  AST 18 19  ALT 28 24  ALKPHOS 45 40  BILITOT 0.3 0.4  PROT 7.5 6.8  ALBUMIN 4.3 3.7    CBC: Recent Labs  Lab 01/03/22 0840 01/09/22 0448  WBC 6.5 12.9*  NEUTROABS 3.7  --   HGB 12.3 11.2*  HCT 41.0 37.2  MCV 82.3 81.8  PLT 384 379    CBG: No results for input(s): "GLUCAP" in the last 168 hours.  Principal Problem:   Morbid obesity ([place])  Signed:  Clovis Riley , [facility], [place] 01/09/2022, 2:03 PM

## 2022-01-09 NOTE — Plan of Care (Signed)
  Problem: Education: Goal: Knowledge of General Education information will improve Description: Including pain rating scale, medication(s)/side effects and non-pharmacologic comfort measures Outcome: Adequate for Discharge   Problem: Health Behavior/Discharge Planning: Goal: Ability to manage health-related needs will improve Outcome: Adequate for Discharge   Problem: Clinical Measurements: Goal: Ability to maintain clinical measurements within normal limits will improve Outcome: Adequate for Discharge Goal: Will remain free from infection Outcome: Adequate for Discharge Goal: Diagnostic test results will improve Outcome: Adequate for Discharge Goal: Respiratory complications will improve Outcome: Adequate for Discharge Goal: Cardiovascular complication will be avoided Outcome: Adequate for Discharge   Problem: Activity: Goal: Risk for activity intolerance will decrease Outcome: Adequate for Discharge   Problem: Nutrition: Goal: Adequate nutrition will be maintained Outcome: Adequate for Discharge   Problem: Coping: Goal: Level of anxiety will decrease Outcome: Adequate for Discharge   Problem: Elimination: Goal: Will not experience complications related to bowel motility Outcome: Adequate for Discharge Goal: Will not experience complications related to urinary retention Outcome: Adequate for Discharge   Problem: Pain Managment: Goal: General experience of comfort will improve Outcome: Adequate for Discharge   Problem: Safety: Goal: Ability to remain free from injury will improve Outcome: Adequate for Discharge   Problem: Skin Integrity: Goal: Risk for impaired skin integrity will decrease Outcome: Adequate for Discharge   Problem: Education: Goal: Ability to state signs and symptoms to report to health care provider will improve Outcome: Adequate for Discharge Goal: Knowledge of the prescribed self-care regimen will improve Outcome: Adequate for  Discharge Goal: Knowledge of discharge needs will improve Outcome: Adequate for Discharge   Problem: Activity: Goal: Ability to tolerate increased activity will improve Outcome: Adequate for Discharge   Problem: Bowel/Gastric: Goal: Gastrointestinal status for postoperative course will improve Outcome: Adequate for Discharge Goal: Occurrences of nausea will decrease Outcome: Adequate for Discharge   Problem: Coping: Goal: Development of coping mechanisms to deal with changes in body function or appearance will improve Outcome: Adequate for Discharge   Problem: Fluid Volume: Goal: Maintenance of adequate hydration will improve Outcome: Adequate for Discharge   Problem: Nutritional: Goal: Nutritional status will improve Outcome: Adequate for Discharge   Problem: Clinical Measurements: Goal: Will show no signs or symptoms of venous thromboembolism Outcome: Adequate for Discharge Goal: Will remain free from infection Outcome: Adequate for Discharge Goal: Will show no signs of GI Leak Outcome: Adequate for Discharge   Problem: Respiratory: Goal: Will regain and/or maintain adequate ventilation Outcome: Adequate for Discharge   Problem: Pain Management: Goal: Pain level will decrease Outcome: Adequate for Discharge   Problem: Skin Integrity: Goal: Demonstration of wound healing without infection will improve Outcome: Adequate for Discharge   

## 2022-01-09 NOTE — Progress Notes (Signed)
S: Uneventful night. Some epigastric pain and possibly sticking. No nausea. Walking well.   O: Vitals, labs, intake/output, and orders reviewed at this time. Afebrile, no tachycardia, normotensive. Sats 100% ra. PO 360, UOP 3200 + 2 occurrences. CMP normal, WBC 12.9 (6.5 preop), hgb 11.2 (12.3 preop), plt 379 (384)  PRN meds- tramadol x 2, oxycodone x 1  Gen: A&Ox3, no distress  H&N: EOMI, atraumatic, neck supple Chest: unlabored respirations, RRR Abd: soft, nontender, nondistended, incision(s) c/d/i without cellulitis or hematoma Ext: warm, no edema Neuro: grossly normal  Lines/tubes/drains: PIV  A/P: POD 1 s/p sleeve gastrectomy with hiatal hernia repair -Continue clears/ protein shakes -Continue scds, heparin, pulm toilet -Anticipate dc this afternoon   Romana Juniper, MD [facility], [place]

## 2022-01-09 NOTE — TOC Progression Note (Signed)
Transition of Care [facility]) - Progression Note    Patient Details  Name: Brittney Gill MRN: [id number] Date of Birth: 07/23/1995  Transition of Care [facility]) CM/SW Contact  Henrietta Dine, RN Phone Number: 01/09/2022, 9:08 AM  Clinical Narrative:      Transition of Care [facility]) Screening Note   Patient Details  Name: Brittney Gill Date of Birth: Nov 05, 1995   Transition of Care [facility]) CM/SW Contact:    Henrietta Dine, RN Phone Number: 01/09/2022, 9:08 AM    Transition of Care Department [facility]) has reviewed patient and no TOC needs have been identified at this time. We will continue to monitor patient advancement through interdisciplinary progression rounds. If new patient transition needs arise, please place a TOC consult.         Expected Discharge Plan and Services                                                 Social Determinants of Health (SDOH) Interventions    Readmission Risk Interventions     No data to display

## 2022-01-09 NOTE — Discharge Instructions (Signed)

## 2022-01-10 LAB — SURGICAL PATHOLOGY

## 2022-01-15 ENCOUNTER — Telehealth (HOSPITAL_COMMUNITY): Payer: Self-pay | Admitting: *Deleted

## 2022-01-15 NOTE — Telephone Encounter (Signed)
1.  Tell me about your pain and pain management? Pt denies any current pain. Pt states that she has been experiencing some intermittent abdominal cramping while drinking that lasts for a few seconds and then "goes away".  Pt states that "you can just feel it going down".  Pt can tolerate protein shakes and water.  Pt instructed to call [facility] if pain worsens.  2.  Let's talk about fluid intake.  How much total fluid are you taking in? Pt states that she is getting in at least 55-60oz of fluid including protein shakes, bottled water, and yogurt.  3.  How much protein have you taken in the last 2 days? Pt states that she is working to meet goal of goal of 60g of protein today.  Pt has already consumed one protein shake and some yogurt.  Pt plans to work on meeting daily protein goal by adding protein powder to liquids.   4.  Have you had nausea?  Tell me about when have experienced nausea and what you did to help? Pt denies nausea.   5.  Has the frequency or color changed with your urine? Pt states that she is urinating "fine" with no changes in frequency or urgency.     6.  Tell me what your incisions look like? "Incisions look fine". Pt denies a fever, chills.  Pt states incisions are not swollen, open, or draining.  Pt encouraged to call [facility] if incisions change.   7.  Have you been passing gas? BM? Pt states that she is having BMs. Last BM 01/14/22.     8.  If a problem or question were to arise who would you call?  Do you know contact numbers for [facility], [facility], and [facility]? Pt denies dehydration symptoms.  Pt can describe s/sx of dehydration.  Pt knows to call [facility] for surgical, [facility] for nutrition, and [facility] for non-urgent questions or concerns.   9.  How has the walking going? Pt states she is walking around and able to be active without difficulty.   10. Are you still using your incentive spirometer?  If so, how often? Pt states that she forgot the I.S. at discharge.  Discussed with the pt about  TCDB and being intentional about performing the lung exercise at least 10x every hour while awake until she sees the surgeon.  11.  How are your vitamins and calcium going?  How are you taking them? Pt states that she is taking her supplements and vitamins without difficulty.  Reminded patient that the first 30 days post-operatively are important for successful recovery.  Practice good hand hygiene, wearing a mask when appropriate (since optional in most places), and minimizing exposure to people who live outside of the home, especially if they are exhibiting any respiratory, GI, or illness-like symptoms.

## 2022-01-22 ENCOUNTER — Encounter: Payer: Managed Care, Other (non HMO) | Attending: Surgery | Admitting: Dietician

## 2022-01-22 ENCOUNTER — Encounter: Payer: Self-pay | Admitting: Dietician

## 2022-01-22 DIAGNOSIS — G4730 Sleep apnea, unspecified: Secondary | ICD-10-CM | POA: Insufficient documentation

## 2022-01-22 DIAGNOSIS — E669 Obesity, unspecified: Secondary | ICD-10-CM

## 2022-01-22 DIAGNOSIS — F32A Depression, unspecified: Secondary | ICD-10-CM | POA: Diagnosis not present

## 2022-01-22 DIAGNOSIS — F419 Anxiety disorder, unspecified: Secondary | ICD-10-CM | POA: Insufficient documentation

## 2022-01-22 DIAGNOSIS — Z6841 Body Mass Index (BMI) 40.0 and over, adult: Secondary | ICD-10-CM | POA: Insufficient documentation

## 2022-01-22 NOTE — Progress Notes (Signed)
2 Week Post-Operative Nutrition Class   Patient was seen on 01/22/2022 for Post-Operative Nutrition education at the [facility].    Surgery date: 01/08/2022 Surgery type: Sleeve Gastrectomy  Weight: 270.0lbs Height: 5'5" Weight today:  249.0 lbs BMI: 41.44 kg/m   Patient's Goal Weight: 180lbs Bowel Habits: Every day to every other day no complaints    Body Composition Scale 01/22/2022  Current Body Weight 249.0  Total Body Fat % 44.1  Visceral Fat 11  Fat-Free Mass % 55.8   Total Body Water % 42.4  Muscle-Mass lbs 33.1  BMI 41.1  Body Fat Displacement          Torso  lbs 68.1         Left Leg  lbs 13.6         Right Leg  lbs 13.6         Left Arm  lbs 6.8         Right Arm  lbs 6.8   Clinical: Medical History: sleep apnea, anxiety, depression Medications and Supplements: buPROPion, ibuprofen prn, sertraline Relevant labs: no recent lab results available Notable symptoms: limited energy/ activity Drug allergies: diflucan (rash), vitamin E cream (itching on legs) Food allergies: none known   The following the learning objectives were met by the patient during this course: Identifies Phase 3 (Soft, High Proteins) Dietary Goals and will begin from 2 weeks post-operatively to 2 months post-operatively Identifies appropriate sources of fluids and proteins  Identifies appropriate fat sources and healthy verses unhealthy fat types   States protein recommendations and appropriate sources post-operatively Identifies the need for appropriate texture modifications, mastication, and bite sizes when consuming solids Identifies appropriate fat consumption and sources Identifies appropriate multivitamin and calcium sources post-operatively Describes the need for physical activity post-operatively and will follow MD recommendations States when to call healthcare provider regarding medication questions or post-operative complications   Handouts given  during class include: Phase 3A: Soft, High Protein Diet Handout Phase 3 High Protein Meals Healthy Fats   Follow-Up Plan: Patient will follow-up at [facility] in 6 weeks for 2 month post-op nutrition visit for diet advancement per MD.

## 2022-01-28 ENCOUNTER — Ambulatory Visit: Payer: Medicaid Other | Admitting: Internal Medicine

## 2022-01-28 NOTE — Progress Notes (Signed)
[name] canceled her appointment.

## 2022-01-29 ENCOUNTER — Telehealth: Payer: Self-pay | Admitting: Dietician

## 2022-01-29 NOTE — Telephone Encounter (Signed)
RD called pt to verify fluid intake once starting soft, solid proteins 2 week post-bariatric surgery.   Daily Fluid intake:  Daily Protein intake:  Bowel Habits:   Concerns/issues:    Left Voice Message 

## 2022-02-27 ENCOUNTER — Encounter: Payer: Self-pay | Admitting: Dietician

## 2022-02-27 ENCOUNTER — Encounter: Payer: Managed Care, Other (non HMO) | Attending: Surgery | Admitting: Dietician

## 2022-02-27 VITALS — Wt 230.0 lb

## 2022-02-27 DIAGNOSIS — E669 Obesity, unspecified: Secondary | ICD-10-CM

## 2022-02-27 DIAGNOSIS — Z6838 Body mass index (BMI) 38.0-38.9, adult: Secondary | ICD-10-CM | POA: Insufficient documentation

## 2022-02-27 DIAGNOSIS — Z9884 Bariatric surgery status: Secondary | ICD-10-CM | POA: Diagnosis not present

## 2022-02-27 DIAGNOSIS — Z713 Dietary counseling and surveillance: Secondary | ICD-10-CM | POA: Insufficient documentation

## 2022-02-27 DIAGNOSIS — G4733 Obstructive sleep apnea (adult) (pediatric): Secondary | ICD-10-CM

## 2022-02-27 DIAGNOSIS — Z5189 Encounter for other specified aftercare: Secondary | ICD-10-CM | POA: Diagnosis not present

## 2022-02-27 NOTE — Progress Notes (Signed)
Nutrition Therapy for Post-Operative Bariatric Diet Follow-up visit:  7 weeks post-op sleeve gastrectomy surgery  Medical Nutrition Therapy:  Appt start time: 1415 end time:  1445  Anthropometrics: Weight: 230lbs (at home) Height: 5'5" BMI: 38.27 Initial weight at [facility]: 270lbs 10/19/21  No body composition measurement due to virtual visit  Clinical: Medications: buPROPion, pantoprozole, sertraline, traMADol prn Supplementation: bariatric multivitamin, calcium  Health/ medical history changes: no changes GI symptoms: N/V/D/C: no symptoms Dumping Syndrome: no Hair loss: no  Dietary/ Lifestyle Progress: Total weight loss of over 40lbs since surgery Patient reports consuming 3 meals and 2 snacks daily. She is eating solid foods exclusively, no protein drinks currently. She has tolerated all foods well so far. She had a weight loss plateau for a short time several weeks ago, but weight loss has resumed.  Dietary recall: Eating pattern: 3 meals, 2 snacks Dining out:  Breakfast: egg bite with [place] sausage and cheese Snack: few cashews or a few bites of deli [place]  Lunch: deli [place] Snack: same as am  Dinner: ground [place] meat sauce with protein pasta; chicken alfredo with protein pasta; steak; lean burger patty; baked chicken. Sometimes adds veg ie green beans Snack: none  Fluid intake: 70-80oz daily water only  Estimated total protein intake: 55-65g Bariatric diet adherence:  Using straws: yes, denies any issues Drinking fluids during meals: no Carbonated beverages: no  Recent physical activity:  walking 30-45 minutes, 3-5 times a week   Nutrition Intervention:   Reviewed progress since previous visit on 01/21/22 for post-op class. Discussed advancing diet to include more non-starchy vegetables and gradually add peas, beans, sweet potatoes followed by fruits. Reviewed importance of eating protein food first, followed by low carb veg, then other food groups Advised  ongoing avoidance of breads, cereals, white potatoes, pastas and rice Discussed weight loss plateaus as normal occurrences during weight loss journey and continuing with healthy and balanced eating pattern along with regular exercise to overcome plateau.  Nutritional Diagnosis:  [place]-3.3 Overweight/ obesity related to history of excess calories and inadequate physical activity as evidenced by patient with current BMI of 38.27, following bariatric diet guidelines for ongoing weight loss after sleeve gastrectomy.  Teaching Method Utilized:  Auditory Hands on  Materials provided: Phase 3 and 4 bariatric post-op diet handouts (sent via patient portal)   Learning Readiness:  Change in progress  Barriers to learning/adherence to lifestyle change: none  Demonstrated degree of understanding via:  Teach Back      Plan: Work on goals as listed in Patient Instructions Return for follow up at 6 months post-op: 06/24/22 at 5:00pm, or sooner if need arises.

## 2022-02-27 NOTE — Patient Instructions (Signed)
Continue eating meals and snacks at regular intervals and emphasizing protein and fluids.  Add more non-starchy veggies, peas, beans, or sweet potato; then gradually add small amounts of fruits -- only after eating protein food first at meal or snack.  Monitor fullness as you eat, continue to take small bites, chew thoroughly. Allow up to 30 minutes to eat what you are able to eat, then wait at least 2-3 hours before the next meal or snack. Keep up the regular exercise, great job!

## 2022-03-06 ENCOUNTER — Other Ambulatory Visit (HOSPITAL_COMMUNITY): Payer: Self-pay

## 2022-03-07 ENCOUNTER — Ambulatory Visit: Payer: Medicaid Other | Admitting: Dietician

## 2022-04-04 ENCOUNTER — Emergency Department
Admission: EM | Admit: 2022-04-04 | Discharge: 2022-04-04 | Disposition: A | Discharge status: Home / Self Care | Payer: Managed Care, Other (non HMO) | Attending: Emergency Medicine | Admitting: Emergency Medicine

## 2022-04-04 ENCOUNTER — Emergency Department: Payer: Managed Care, Other (non HMO)

## 2022-04-04 DIAGNOSIS — G43809 Other migraine, not intractable, without status migrainosus: Secondary | ICD-10-CM | POA: Diagnosis not present

## 2022-04-04 DIAGNOSIS — R519 Headache, unspecified: Secondary | ICD-10-CM | POA: Diagnosis present

## 2022-04-04 LAB — CBC WITH DIFFERENTIAL/PLATELET
Abs Immature Granulocytes: 0.02 10*3/uL (ref 0.00–0.07)
Basophils Absolute: 0.1 10*3/uL (ref 0.0–0.1)
Basophils Relative: 1 %
Eosinophils Absolute: 0.1 10*3/uL (ref 0.0–0.5)
Eosinophils Relative: 1 %
HCT: 41.2 % (ref 36.0–46.0)
Hemoglobin: 12.7 g/dL (ref 12.0–15.0)
Immature Granulocytes: 0 %
Lymphocytes Relative: 35 %
Lymphs Abs: 3.1 10*3/uL (ref 0.7–4.0)
MCH: 26.2 pg (ref 26.0–34.0)
MCHC: 30.8 g/dL (ref 30.0–36.0)
MCV: 84.9 fL (ref 80.0–100.0)
Monocytes Absolute: 0.6 10*3/uL (ref 0.1–1.0)
Monocytes Relative: 6 %
Neutro Abs: 5.2 10*3/uL (ref 1.7–7.7)
Neutrophils Relative %: 57 %
Platelets: 337 10*3/uL (ref 150–400)
RBC: 4.85 MIL/uL (ref 3.87–5.11)
RDW: 15.3 % (ref 11.5–15.5)
WBC: 9.1 10*3/uL (ref 4.0–10.5)
nRBC: 0 % (ref 0.0–0.2)

## 2022-04-04 LAB — COMPREHENSIVE METABOLIC PANEL
ALT: 19 U/L (ref 0–44)
AST: 20 U/L (ref 15–41)
Albumin: 4.3 g/dL (ref 3.5–5.0)
Alkaline Phosphatase: 48 U/L (ref 38–126)
Anion gap: 11 (ref 5–15)
BUN: 10 mg/dL (ref 6–20)
CO2: 24 mmol/L (ref 22–32)
Calcium: 9 mg/dL (ref 8.9–10.3)
Chloride: 103 mmol/L (ref 98–111)
Creatinine, Ser: 0.62 mg/dL (ref 0.44–1.00)
GFR, Estimated: >60 mL/min (ref >60)
Glucose, Bld: 102 mg/dL — ABNORMAL HIGH (ref 70–99)
Potassium: 3.5 mmol/L (ref 3.5–5.1)
Sodium: 138 mmol/L (ref 135–145)
Total Bilirubin: 0.4 mg/dL (ref 0.3–1.2)
Total Protein: 7.5 g/dL (ref 6.5–8.1)

## 2022-04-04 MED ORDER — KETOROLAC TROMETHAMINE 30 MG/ML IJ SOLN
30.0000 mg | Freq: Once | INTRAMUSCULAR | Status: AC
  Administered 2022-04-04: 30 mg via INTRAVENOUS
  Filled 2022-04-04: qty 1

## 2022-04-04 MED ORDER — DIPHENHYDRAMINE HCL 50 MG/ML IJ SOLN
50.0000 mg | Freq: Once | INTRAMUSCULAR | Status: AC
  Administered 2022-04-04: 50 mg via INTRAVENOUS
  Filled 2022-04-04: qty 1

## 2022-04-04 MED ORDER — SODIUM CHLORIDE 0.9 % IV BOLUS
1000.0000 mL | Freq: Once | INTRAVENOUS | Status: AC
  Administered 2022-04-04: 1000 mL via INTRAVENOUS

## 2022-04-04 MED ORDER — METOCLOPRAMIDE HCL 5 MG/ML IJ SOLN
10.0000 mg | Freq: Once | INTRAMUSCULAR | Status: AC
  Administered 2022-04-04: 10 mg via INTRAVENOUS
  Filled 2022-04-04: qty 2

## 2022-04-04 NOTE — ED Provider Notes (Signed)
[facility] Provider Note  Patient Contact: 6:17 PM (approximate)   History   Migraine   HPI  Brittney Gill is a 27 y.o. female with a history of depression, anxiety and sleep apnea, presents to the emergency department with atypical headache.  Patient states that her headache started yesterday and she noticed it when she awoke.  She denies headache awakening her from sleep.  She states that headache has been consistent in terms of pain and has not progressed in intensity quickly.  She states that headache involves tingling along the left side of her face which is atypical for her.  She states that she did have a history of migraines when she was a teenager but has not had a headache consistently since then.  She denies falls or mechanisms of trauma.  No fever or neck pain.  No chest pain, chest tightness or shortness of breath.      Physical Exam   Triage Vital Signs: ED Triage Vitals [04/04/22 1735]  Enc Vitals Group     BP (!) 140/83     Pulse Rate 74     Resp 18     Temp 98.4 F (36.9 C)     Temp Source Oral     SpO2 96 %     Weight 215 lb (97.5 kg)     Height      Head Circumference      Peak Flow      Pain Score 5     Pain Loc      Pain Edu?      Excl. in [place]?     Most recent vital signs: Vitals:   04/04/22 1735 04/04/22 2021  BP: (!) 140/83 134/76  Pulse: 74 70  Resp: 18 19  Temp: 98.4 F (36.9 C) 98.4 F (36.9 C)  SpO2: 96% 98%     General: Alert and in no acute distress. Eyes:  PERRL. EOMI. Head: No acute traumatic findings ENT:      Nose: No congestion/rhinnorhea.      Mouth/Throat: Mucous membranes are moist.  Neck: No stridor. No cervical spine tenderness to palpation. Cardiovascular:  Good peripheral perfusion Respiratory: Normal respiratory effort without tachypnea or retractions. Lungs CTAB. Good air entry to the bases with no decreased or absent breath sounds. Gastrointestinal: Bowel sounds 4 quadrants. Soft  and nontender to palpation. No guarding or rigidity. No palpable masses. No distention. No CVA tenderness. Musculoskeletal: Full range of motion to all extremities.  Neurologic:  No gross focal neurologic deficits are appreciated.  Cranial nerves II through XII are intact. Skin:   No rash noted Other:   ED Results / Procedures / Treatments   Labs (all labs ordered are listed, but only abnormal results are displayed) Labs Reviewed  COMPREHENSIVE METABOLIC PANEL - Abnormal; Notable for the following components:      Result Value   Glucose, Bld 102 (*)    All other components within normal limits  CBC WITH DIFFERENTIAL/PLATELET  URINALYSIS, ROUTINE W REFLEX MICROSCOPIC  PREGNANCY, URINE      RADIOLOGY  I personally viewed and evaluated these images as part of my medical decision making, as well as reviewing the written report by the radiologist.  ED Provider Interpretation: CT head shows no acute abnormality   PROCEDURES:  Critical Care performed: No  Procedures   MEDICATIONS ORDERED IN ED: Medications  sodium chloride 0.9 % bolus 1,000 mL (0 mLs Intravenous Stopped 04/04/22 2021)  metoCLOPramide (REGLAN) injection  10 mg (10 mg Intravenous Given 04/04/22 1851)  diphenhydrAMINE (BENADRYL) injection 50 mg (50 mg Intravenous Given 04/04/22 1851)  ketorolac (TORADOL) 30 MG/ML injection 30 mg (30 mg Intravenous Given 04/04/22 1949)     IMPRESSION / MDM / ASSESSMENT AND PLAN / ED COURSE  I reviewed the triage vital signs and the nursing notes.                              Assessment and plan:  Headache:  27 year old female presents to the emergency department with a headache since yesterday that was atypical in nature.  Vital signs are reassuring at triage.  On exam, patient was alert and nontoxic-appearing.  CBC and CMP were reassuring without acute abnormality.  Head CT shows no acute abnormality.  Patient received migraine cocktail consisting of normal saline bolus,  Toradol, Benadryl and Reglan and her headache resolved.  Return precautions were given to return with new or worsening symptoms.  All patient questions were answered.     FINAL CLINICAL IMPRESSION(S) / ED DIAGNOSES   Final diagnoses:  Other migraine without status migrainosus, not intractable     Rx / DC Orders   ED Discharge Orders     None        Note:  This document was prepared using Dragon voice recognition software and may include unintentional dictation errors.   Vallarie Mare [place]ant[place], Hershal Coria 04/04/22 2028    Harvest Dark, MD 04/05/22 1925

## 2022-04-04 NOTE — ED Triage Notes (Addendum)
Pt sts that she has a migraine and it has been going on for the last two days and has been taking OTC tylenol. Pt sts that she is a recent gastric sleeve pt and would like that checked out too to make sure everything is okay with that also.

## 2022-06-24 ENCOUNTER — Ambulatory Visit: Payer: Medicaid Other | Admitting: Dietician

## 2023-07-17 ENCOUNTER — Other Ambulatory Visit: Payer: Self-pay

## 2023-07-17 ENCOUNTER — Emergency Department

## 2023-07-17 DIAGNOSIS — R079 Chest pain, unspecified: Secondary | ICD-10-CM | POA: Insufficient documentation

## 2023-07-17 DIAGNOSIS — R0602 Shortness of breath: Secondary | ICD-10-CM | POA: Diagnosis present

## 2023-07-17 LAB — CBC
HCT: 35.8 % — ABNORMAL LOW (ref 36.0–46.0)
Hemoglobin: 11.5 g/dL — ABNORMAL LOW (ref 12.0–15.0)
MCH: 26.7 pg (ref 26.0–34.0)
MCHC: 32.1 g/dL (ref 30.0–36.0)
MCV: 83.1 fL (ref 80.0–100.0)
Platelets: 350 10*3/uL (ref 150–400)
RBC: 4.31 MIL/uL (ref 3.87–5.11)
RDW: 13.4 % (ref 11.5–15.5)
WBC: 9.9 10*3/uL (ref 4.0–10.5)
nRBC: 0 % (ref 0.0–0.2)

## 2023-07-17 LAB — BASIC METABOLIC PANEL WITH GFR
Anion gap: 8 (ref 5–15)
BUN: 12 mg/dL (ref 6–20)
CO2: 22 mmol/L (ref 22–32)
Calcium: 8.6 mg/dL — ABNORMAL LOW (ref 8.9–10.3)
Chloride: 105 mmol/L (ref 98–111)
Creatinine, Ser: 0.6 mg/dL (ref 0.44–1.00)
GFR, Estimated: >60 mL/min (ref >60)
Glucose, Bld: 94 mg/dL (ref 70–99)
Potassium: 3.4 mmol/L — ABNORMAL LOW (ref 3.5–5.1)
Sodium: 135 mmol/L (ref 135–145)

## 2023-07-17 LAB — TROPONIN I (HIGH SENSITIVITY): Troponin I (High Sensitivity): <2 ng/L (ref <18)

## 2023-07-17 NOTE — ED Triage Notes (Signed)
 Pt to ED via POV c/o SHOB x2weeks and CP x1hr. Pt says she was seen at [facility] on Monday for shob and was prescribed albuterol inhaler but has not had any relief. Denies cough, congestion, fevers, dizziness

## 2023-07-18 ENCOUNTER — Emergency Department
Admission: EM | Admit: 2023-07-18 | Discharge: 2023-07-18 | Disposition: A | Discharge status: Home / Self Care | Attending: Emergency Medicine | Admitting: Emergency Medicine

## 2023-07-18 ENCOUNTER — Emergency Department

## 2023-07-18 DIAGNOSIS — R0602 Shortness of breath: Secondary | ICD-10-CM

## 2023-07-18 DIAGNOSIS — R079 Chest pain, unspecified: Secondary | ICD-10-CM

## 2023-07-18 LAB — TROPONIN I (HIGH SENSITIVITY): Troponin I (High Sensitivity): <2 ng/L (ref <18)

## 2023-07-18 LAB — HEPATIC FUNCTION PANEL
ALT: 14 U/L (ref 0–44)
AST: 16 U/L (ref 15–41)
Albumin: 4 g/dL (ref 3.5–5.0)
Alkaline Phosphatase: 42 U/L (ref 38–126)
Bilirubin, Direct: <0.1 mg/dL (ref 0.0–0.2)
Total Bilirubin: 0.4 mg/dL (ref 0.0–1.2)
Total Protein: 7.5 g/dL (ref 6.5–8.1)

## 2023-07-18 LAB — POC URINE PREG, ED: Preg Test, Ur: NEGATIVE

## 2023-07-18 LAB — D-DIMER, QUANTITATIVE: D-Dimer, Quant: 1.04 ug{FEU}/mL — ABNORMAL HIGH (ref 0.00–0.50)

## 2023-07-18 LAB — HCG, QUANTITATIVE, PREGNANCY: hCG, Beta Chain, Quant, S: <1 m[IU]/mL (ref <5)

## 2023-07-18 LAB — LIPASE, BLOOD: Lipase: 47 U/L (ref 11–51)

## 2023-07-18 MED ORDER — IOHEXOL 350 MG/ML SOLN
75.0000 mL | Freq: Once | INTRAVENOUS | Status: AC | PRN
  Administered 2023-07-18: 75 mL via INTRAVENOUS

## 2023-07-18 NOTE — ED Provider Notes (Signed)
 [facility] Provider Note    Event Date/Time   First MD Initiated Contact with Patient 07/18/23 [phone]     (approximate)   History   Shortness of Breath and Chest Pain   HPI  Brittney Gill is a 28 y.o. female   Past medical history of anxiety and depression who presents emergency department with intermittent shortness of breath for the last 2 weeks and central substernal chest pain today.  Both the symptoms are transient and can occur while exerting herself or at rest.  Currently she is not experiencing either.  She does not have any respiratory infectious symptoms.  No history of blood clot she has no leg symptoms.   External Medical Documents Reviewed: Discharge summary from general surgery in 2023 when she had a laparoscopic sleeve gastrectomy      Physical Exam   Triage Vital Signs: ED Triage Vitals  Encounter Vitals Group     BP 07/17/23 2204 132/79     Systolic BP Percentile --      Diastolic BP Percentile --      Pulse Rate 07/17/23 2204 86     Resp 07/17/23 2204 19     Temp 07/17/23 2204 99.1 F (37.3 C)     Temp Source 07/17/23 2204 Oral     SpO2 07/17/23 2204 100 %     Weight 07/17/23 2200 208 lb (94.3 kg)     Height 07/17/23 2200 5\' 5"  (1.651 m)     Head Circumference --      Peak Flow --      Pain Score 07/17/23 2200 3     Pain Loc --      Pain Education --      Exclude from Growth Chart --     Most recent vital signs: Vitals:   07/17/23 2204  BP: 132/79  Pulse: 86  Resp: 19  Temp: 99.1 F (37.3 C)  SpO2: 100%    General: Awake, no distress.  CV:  Good peripheral perfusion.  Resp:  Normal effort.  Abd:  No distention.  Other:  She is comfortable appearing, breathing comfortably, clear lungs with no focality or wheezing, and a soft nontender abdomen deep palpation all quadrants.  Hemodynamics appropriate reassuring with no hypoxemia.   ED Results / Procedures / Treatments   Labs (all labs ordered are  listed, but only abnormal results are displayed) Labs Reviewed  BASIC METABOLIC PANEL WITH GFR - Abnormal; Notable for the following components:      Result Value   Potassium 3.4 (*)    Calcium  8.6 (*)    All other components within normal limits  CBC - Abnormal; Notable for the following components:   Hemoglobin 11.5 (*)    HCT 35.8 (*)    All other components within normal limits  D-DIMER, QUANTITATIVE - Abnormal; Notable for the following components:   D-Dimer, Quant 1.04 (*)    All other components within normal limits  HEPATIC FUNCTION PANEL  LIPASE, BLOOD  HCG, QUANTITATIVE, PREGNANCY  POC URINE PREG, ED  TROPONIN I (HIGH SENSITIVITY)  TROPONIN I (HIGH SENSITIVITY)     I ordered and reviewed the above labs they are notable for dimer is elevated over 1.  EKG  ED ECG REPORT I, Buell Carmin, the attending physician, personally viewed and interpreted this ECG.   Date: 07/18/2023  EKG Time: 2202  Rate: 90  Rhythm: sinus  Axis: nl  Intervals:nl  ST&T Change: no stemi  RADIOLOGY I independently reviewed and interpreted chest x-ray and I see no obvious focality or pneumothorax I also reviewed radiologist's formal read.   PROCEDURES:  Critical Care performed: No  Procedures   MEDICATIONS ORDERED IN ED: Medications  iohexol  (OMNIPAQUE ) 350 MG/ML injection 75 mL (has no administration in time range)     IMPRESSION / MDM / ASSESSMENT AND PLAN / ED COURSE  I reviewed the triage vital signs and the nursing notes.                                Patient's presentation is most consistent with acute presentation with potential threat to life or bodily function.  Differential diagnosis includes, but is not limited to, ACS, PE, dissection, respiratory infection, costochondritis   The patient is on the cardiac monitor to evaluate for evidence of arrhythmia and/or significant heart rate changes.  MDM:    Chest pain and shortness of breath which is  uncharacteristic for ACS PE or dissection and that is transient in nature and not bothering her now.  She has no respiratory infectious symptoms without infection.  I considered ACS or PE but again not quite characteristic for these problems.  EKG is nonischemic and first troponin negative I doubt ACS.  I considered PE and started with dimer which unfortunately is elevated so proceeded with a CT angiogram of the chest.  She has a benign abdominal exam so I doubt this is an intra-abdominal problem.  Ultimately if CT angiogram is negative, then I think the risk of life-threatening cardiopulmonary emergency at this time is very low and she will be discharged with close outpatient follow-up with       FINAL CLINICAL IMPRESSION(S) / ED DIAGNOSES   Final diagnoses:  Shortness of breath  Nonspecific chest pain     Rx / DC Orders   ED Discharge Orders     None        Note:  This document was prepared using Dragon voice recognition software and may include unintentional dictation errors.    Buell Carmin, MD 07/18/23 [phone]

## 2023-07-18 NOTE — Discharge Instructions (Signed)
 Fortunately your evaluation in the emergency department did not show any emergency conditions account for your symptoms today.  Thank you for choosing us  for your health care today!  Please see your primary doctor this week for a follow up appointment.   If you have any new, worsening, or unexpected symptoms call your doctor right away or come back to the emergency department for reevaluation.  It was my pleasure to care for you today.   Arron Large Margery Sheets, MD

## 2023-07-18 NOTE — ED Notes (Signed)
 Patient transported to CT

## 2023-07-18 NOTE — ED Notes (Signed)
 Pt has returned from CT.

## 2023-08-01 ENCOUNTER — Inpatient Hospital Stay

## 2023-08-01 ENCOUNTER — Inpatient Hospital Stay: Attending: Internal Medicine | Admitting: Internal Medicine

## 2023-08-01 ENCOUNTER — Encounter: Payer: Self-pay | Admitting: Internal Medicine

## 2023-08-01 VITALS — BP 126/89 | HR 90 | Temp 99.2°F | Resp 16 | Ht 65.0 in | Wt 208.4 lb

## 2023-08-01 DIAGNOSIS — Z87891 Personal history of nicotine dependence: Secondary | ICD-10-CM | POA: Diagnosis not present

## 2023-08-01 DIAGNOSIS — R5383 Other fatigue: Secondary | ICD-10-CM | POA: Insufficient documentation

## 2023-08-01 DIAGNOSIS — Z9884 Bariatric surgery status: Secondary | ICD-10-CM | POA: Diagnosis not present

## 2023-08-01 DIAGNOSIS — D649 Anemia, unspecified: Secondary | ICD-10-CM | POA: Diagnosis not present

## 2023-08-01 DIAGNOSIS — R0602 Shortness of breath: Secondary | ICD-10-CM | POA: Insufficient documentation

## 2023-08-01 NOTE — Assessment & Plan Note (Addendum)
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 On PO iron x 1 week- HOLD off PO iron sec to dyspepsia.  #  Discussed regarding IV iron infusion/Venofer. Discussed the potential acute infusion reactions with IV iron; which are quite rare.  Patient understands the risk; will proceed with infusions.   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   # .B12- 372- reocmmend b12 SL OTC  Thank you Ms. Flinchum FNP. for allowing me to participate in the care of your pleasant patient. Please do not hesitate to contact me with questions or concerns in the interim.  # DISPOSITION: #  NO labs today # every 2 weeks  x 4 - start next week  # follow up in 3 months- MD; 1 week prior- [facility]- cbc/bmp;iron studies; B12; vit D 25-OH possible venofer- Dr.B

## 2023-08-01 NOTE — Progress Notes (Signed)
 Fatigue/weakness: YES Dyspena: SOB Light headedness: AT TIMES Blood in stool: NO

## 2023-08-01 NOTE — Progress Notes (Signed)
 Brittney Gill CONSULT NOTE  Patient Care Team: Practice, [facility] as PCP - General Brittney Leos, Gill as Consulting Physician (Oncology) Brittney Leos, Gill as Consulting Physician (Hematology and Oncology)  CHIEF COMPLAINTS/PURPOSE OF CONSULTATION: ANEMIA   HEMATOLOGY HISTORY  # ANEMIA[Hb; MCV-platelets- WBC; Iron sat; ferritin;  GFR- CT/US - ;   HISTORY OF PRESENTING ILLNESS: Patient ambulating-independently.  Alone   Brittney Gill 28 y.o.  female pleasant patient is  been referred to us  for further evaluation of anemia.  Patient complains of shortness of breath with exertion.  Also complains of excessive fatigue.  Patient had extensive workup including a CT scan of the lungs to rule out PE.  After patient she also had cardiac workup including EKG.  Blood in stools: none EGD/colonoscopy-none Blood in urine:none Difficulty swallowing:none Prior blood transfusion:none Kidney/Liver disease:none Alcohol: rare Bariatric surgery: yes- OCT, 2023 Brittney Gill; Dr. Lanell Gill- [facility]]  Vaginal bleeding: normal.  Prior evaluation with hematology: none Prior bone marrow biopsy: none Prior IV iron infusions:none   Review of Systems  Constitutional:  Positive for malaise/fatigue. Negative for chills, diaphoresis, fever and weight loss.  HENT:  Negative for nosebleeds and sore throat.   Eyes:  Negative for double vision.  Respiratory:  Positive for shortness of breath. Negative for cough, hemoptysis, sputum production and wheezing.   Cardiovascular:  Negative for chest pain, palpitations, orthopnea and leg swelling.  Gastrointestinal:  Negative for abdominal pain, blood in stool, constipation, diarrhea, heartburn, melena, nausea and vomiting.  Genitourinary:  Negative for dysuria, frequency and urgency.  Musculoskeletal:  Negative for back pain and joint pain.  Skin: Negative.  Negative for itching and rash.  Neurological:  Negative for dizziness,  tingling, focal weakness, weakness and headaches.  Endo/Heme/Allergies:  Does not bruise/bleed easily.  Psychiatric/Behavioral:  Negative for depression. The patient is not nervous/anxious and does not have insomnia.      MEDICAL HISTORY:  Past Medical History:  Diagnosis Date   Anxiety    Depression    GERD (gastroesophageal reflux disease)    Pregnancy occurring while using intrauterine contraceptive device (IUD) 02/02/2018   Sleep apnea    cpap    SURGICAL HISTORY: Past Surgical History:  Procedure Laterality Date   FRACTURE SURGERY     Left   HIATAL HERNIA REPAIR  01/08/2022   Procedure: LAPAROSCOPIC REPAIR OF HIATAL HERNIA;  Surgeon: Brittney Gill;  Location: [facility];  Service: General;;   LAPAROSCOPIC BILATERAL SALPINGECTOMY Bilateral 08/15/2020   Procedure: LAPAROSCOPIC BILATERAL SALPINGECTOMY;  Surgeon: Brittney Gill;  Location: [facility];  Service: Gynecology;  Laterality: Bilateral;   LAPAROSCOPIC GASTRIC SLEEVE RESECTION N/A 01/08/2022   Procedure: LAPAROSCOPIC SLEEVE GASTRECTOMY;  Surgeon: Brittney Gill;  Location: [facility];  Service: General;  Laterality: N/A;   UPPER GI ENDOSCOPY N/A 01/08/2022   Procedure: UPPER GI ENDOSCOPY;  Surgeon: Brittney Gill;  Location: [facility];  Service: General;  Laterality: N/A;   WISDOM TOOTH EXTRACTION      SOCIAL HISTORY: Social History   Socioeconomic History   Marital status: Married    Spouse name: Not on file   Number of children: 2   Years of education: Not on file   Highest education level: High school graduate  Occupational History   Not on file  Tobacco Use   Smoking status: Former    Current packs/day: 0.00    Average packs/day: 0.5 packs/day for 1.7 years (0.9 ttl pk-yrs)  Types: Cigarettes    Start date: 04/01/2016    Quit date: 12/23/2017    Years since quitting: 5.6   Smokeless tobacco: Never   Tobacco comments:    Had quit in 2017, started back in 2018 but quit when she found  out she was pregnant.  Vaping Use   Vaping status: Former  Substance and Sexual Activity   Alcohol use: No    Comment: social-not often   Drug use: No   Sexual activity: Yes    Birth control/protection: Surgical    Comment: vasectomy  Other Topics Concern   Not on file  Social History Narrative   Not on file   Social Drivers of Health   Financial Resource Strain: Not on file  Food Insecurity: No Food Insecurity (08/01/2023)   Hunger Vital Sign    Worried About Running Out of Food in the Last Year: Never true    Ran Out of Food in the Last Year: Never true  Transportation Needs: No Transportation Needs (08/01/2023)   PRAPARE - Administrator, Civil Service (Medical): No    Lack of Transportation (Non-Medical): No  Physical Activity: Not on file  Stress: Not on file  Social Connections: Not on file  Intimate Partner Violence: Not At Risk (08/01/2023)   Humiliation, Afraid, Rape, and Kick questionnaire    Fear of Current or Ex-Partner: No    Emotionally Abused: No    Physically Abused: No    Sexually Abused: No    FAMILY HISTORY: Family History  Problem Relation Age of Onset   Breast cancer Neg Hx    Cancer Neg Hx    Ovarian cancer Neg Hx     ALLERGIES:  is allergic to other and diflucan [fluconazole].  MEDICATIONS:  Current Outpatient Medications  Medication Sig Dispense Refill   buPROPion  (WELLBUTRIN  XL) 300 MG 24 hr tablet Take 300 mg by mouth daily.     Iron Combinations (IRON COMPLEX PO) Take by mouth in the morning and at bedtime.     NON FORMULARY Pt uses a cpap nightly     omeprazole (PRILOSEC) 20 MG capsule Take 20 mg by mouth daily.     ondansetron  (ZOFRAN -ODT) 4 MG disintegrating tablet Dissolve 1 tablet (4 mg total) by mouth every 6 (six) hours as needed for nausea or vomiting. (Patient not taking: Reported on 08/01/2023) 20 tablet 0   pantoprazole  (PROTONIX ) 40 MG tablet Take 1 tablet (40 mg total) by mouth daily. Take this medication daily,  regardless of reflux symptoms, for the first 90 days after surgery. (Patient not taking: Reported on 08/01/2023) 90 tablet 0   No current facility-administered medications for this visit.     PHYSICAL EXAMINATION:   Vitals:   08/01/23 1357  BP: 126/89  Pulse: 90  Resp: 16  Temp: 99.2 F (37.3 C)  SpO2: 100%   Filed Weights   08/01/23 1357  Weight: 208 lb 6.4 oz (94.5 kg)    Physical Exam Vitals and nursing note reviewed.  HENT:     Head: Normocephalic and atraumatic.     Mouth/Throat:     Pharynx: Oropharynx is clear.  Eyes:     Extraocular Movements: Extraocular movements intact.     Pupils: Pupils are equal, round, and reactive to light.  Cardiovascular:     Rate and Rhythm: Normal rate and regular rhythm.  Pulmonary:     Comments: Decreased breath sounds bilaterally.  Abdominal:     Palpations: Abdomen is soft.  Musculoskeletal:  General: Normal range of motion.     Cervical back: Normal range of motion.  Skin:    General: Skin is warm.  Neurological:     General: No focal deficit present.     Mental Status: She is alert and oriented to person, place, and time.  Psychiatric:        Behavior: Behavior normal.        Judgment: Judgment normal.      LABORATORY DATA:  I have reviewed the data as listed Lab Results  Component Value Date   WBC 9.9 07/17/2023   HGB 11.5 (L) 07/17/2023   HCT 35.8 (L) 07/17/2023   MCV 83.1 07/17/2023   PLT 350 07/17/2023   Recent Labs    07/17/23 2203 07/18/23 0052  NA 135  --   K 3.4*  --   CL 105  --   CO2 22  --   GLUCOSE 94  --   BUN 12  --   CREATININE 0.60  --   CALCIUM  8.6*  --   GFRNONAA >60  --   PROT  --  7.5  ALBUMIN  --  4.0  AST  --  16  ALT  --  14  ALKPHOS  --  42  BILITOT  --  0.4  BILIDIR  --  <0.1  IBILI  --  NOT CALCULATED     CT Angio Chest PE W/Cm &/Or Wo Cm Result Date: 07/18/2023 CLINICAL DATA:  Shortness of breath EXAM: CT ANGIOGRAPHY CHEST WITH CONTRAST TECHNIQUE:  Multidetector CT imaging of the chest was performed using the standard protocol during bolus administration of intravenous contrast. Multiplanar CT image reconstructions and MIPs were obtained to evaluate the vascular anatomy. RADIATION DOSE REDUCTION: This exam was performed according to the departmental dose-optimization program which includes automated exposure control, adjustment of the mA and/or kV according to patient size and/or use of iterative reconstruction technique. CONTRAST:  75mL OMNIPAQUE  IOHEXOL  350 MG/ML SOLN COMPARISON:  Chest x-ray 07/17/1998, CT chest 06/2015 FINDINGS: Cardiovascular: Satisfactory opacification of the pulmonary arteries to the segmental level. No evidence of pulmonary embolism. Normal heart size. No pericardial effusion. Mediastinum/Nodes: No enlarged mediastinal, hilar, or axillary lymph nodes. Thyroid  gland, trachea, and esophagus demonstrate no significant findings. Lungs/Pleura: Lungs are clear. No pleural effusion or pneumothorax. Upper Abdomen: Postsurgical changes the consistent with gastric sleeve surgery. Musculoskeletal: No chest wall abnormality. No acute or significant osseous findings. Review of the MIP images confirms the above findings. IMPRESSION: Negative. No CT evidence for acute pulmonary embolus. Clear lung fields. Electronically Signed   By: Esmeralda Hedge M.D.   On: 07/18/2023 03:09   DG Chest 2 View Result Date: 07/17/2023 CLINICAL DATA:  CP/SHOB EXAM: CHEST - 2 VIEW COMPARISON:  Chest x-ray 10/22/2021 FINDINGS: The heart and mediastinal contours are within normal limits. No focal consolidation. No pulmonary edema. No pleural effusion. No pneumothorax. No acute osseous abnormality. IMPRESSION: No active cardiopulmonary disease. Electronically Signed   By: Morgane  Naveau M.D.   On: 07/17/2023 22:53    ASSESSMENT & PLAN:   Symptomatic anemia # Anemia- Hb-11 symptomatic.  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 On PO iron x 1 week  #  Discussed regarding IV iron infusion/Venofer. Discussed the potential acute infusion reactions with IV iron; which are quite rare.  Patient understands the risk; will proceed with infusions.   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   # .B12- 372- reocmmend b12 SL OTC  Thank you Ms. Flinchum FNP. for allowing me to participate in the care of your pleasant patient. Please do not hesitate to contact me with questions or concerns in the interim.  # DISPOSITION: #  NO labs today # every 2 weeks  x 4 - start next week  # follow up in 3 months- Gill; 1 week prior- [facility]- cbc/bmp;iron studies; B12; vit D 25-OH possible venofer- Dr.B   All questions were answered. The patient knows to call the clinic with any problems, questions or concerns.    Brittney Mullin R Cyana Shook, Gill 08/01/2023 3:00 PM

## 2023-08-04 ENCOUNTER — Encounter: Payer: Self-pay | Admitting: Internal Medicine

## 2023-08-08 ENCOUNTER — Inpatient Hospital Stay

## 2023-08-08 VITALS — BP 123/76 | HR 71 | Temp 97.8°F | Resp 17

## 2023-08-08 DIAGNOSIS — D649 Anemia, unspecified: Secondary | ICD-10-CM

## 2023-08-08 MED ORDER — IRON SUCROSE 20 MG/ML IV SOLN
200.0000 mg | Freq: Once | INTRAVENOUS | Status: AC
  Administered 2023-08-08: 200 mg via INTRAVENOUS

## 2023-08-08 MED ORDER — SODIUM CHLORIDE 0.9% FLUSH
10.0000 mL | Freq: Once | INTRAVENOUS | Status: AC | PRN
  Administered 2023-08-08: 10 mL
  Filled 2023-08-08: qty 10

## 2023-08-22 ENCOUNTER — Inpatient Hospital Stay

## 2023-08-22 VITALS — BP 121/84 | HR 87 | Temp 96.7°F | Resp 17

## 2023-08-22 DIAGNOSIS — D649 Anemia, unspecified: Secondary | ICD-10-CM | POA: Diagnosis not present

## 2023-08-22 MED ORDER — IRON SUCROSE 20 MG/ML IV SOLN
200.0000 mg | Freq: Once | INTRAVENOUS | Status: AC
  Administered 2023-08-22: 200 mg via INTRAVENOUS

## 2023-08-22 MED ORDER — SODIUM CHLORIDE 0.9% FLUSH
10.0000 mL | Freq: Once | INTRAVENOUS | Status: AC | PRN
  Administered 2023-08-22: 10 mL
  Filled 2023-08-22: qty 10

## 2023-09-04 ENCOUNTER — Encounter: Payer: Self-pay | Admitting: Internal Medicine

## 2023-09-05 ENCOUNTER — Inpatient Hospital Stay: Attending: Internal Medicine

## 2023-09-05 VITALS — BP 114/75 | HR 99 | Temp 96.9°F | Resp 18

## 2023-09-05 DIAGNOSIS — D649 Anemia, unspecified: Secondary | ICD-10-CM | POA: Diagnosis present

## 2023-09-05 DIAGNOSIS — R0602 Shortness of breath: Secondary | ICD-10-CM | POA: Insufficient documentation

## 2023-09-05 DIAGNOSIS — Z9884 Bariatric surgery status: Secondary | ICD-10-CM | POA: Diagnosis not present

## 2023-09-05 DIAGNOSIS — Z87891 Personal history of nicotine dependence: Secondary | ICD-10-CM | POA: Diagnosis not present

## 2023-09-05 DIAGNOSIS — R5383 Other fatigue: Secondary | ICD-10-CM | POA: Diagnosis not present

## 2023-09-05 MED ORDER — IRON SUCROSE 20 MG/ML IV SOLN
200.0000 mg | Freq: Once | INTRAVENOUS | Status: AC
  Administered 2023-09-05: 200 mg via INTRAVENOUS
  Filled 2023-09-05: qty 10

## 2023-09-05 NOTE — Patient Instructions (Signed)

## 2023-09-05 NOTE — Progress Notes (Signed)
Pt states no chance of pregnancy

## 2023-09-19 ENCOUNTER — Inpatient Hospital Stay

## 2023-09-19 VITALS — BP 125/88 | HR 80 | Temp 98.1°F | Resp 18

## 2023-09-19 DIAGNOSIS — D649 Anemia, unspecified: Secondary | ICD-10-CM

## 2023-09-19 MED ORDER — SODIUM CHLORIDE 0.9% FLUSH
10.0000 mL | Freq: Once | INTRAVENOUS | Status: AC | PRN
  Administered 2023-09-19: 10 mL
  Filled 2023-09-19: qty 10

## 2023-09-19 MED ORDER — IRON SUCROSE 20 MG/ML IV SOLN
200.0000 mg | Freq: Once | INTRAVENOUS | Status: AC
  Administered 2023-09-19: 200 mg via INTRAVENOUS

## 2023-10-21 ENCOUNTER — Encounter: Payer: Self-pay | Admitting: Internal Medicine

## 2023-10-31 ENCOUNTER — Inpatient Hospital Stay

## 2023-10-31 ENCOUNTER — Ambulatory Visit

## 2023-10-31 ENCOUNTER — Other Ambulatory Visit: Payer: Self-pay

## 2023-10-31 ENCOUNTER — Inpatient Hospital Stay: Attending: Internal Medicine | Admitting: Nurse Practitioner

## 2023-10-31 VITALS — BP 112/72 | HR 78 | Temp 97.8°F | Resp 18 | Ht 65.0 in | Wt 195.0 lb

## 2023-10-31 DIAGNOSIS — Z9884 Bariatric surgery status: Secondary | ICD-10-CM | POA: Insufficient documentation

## 2023-10-31 DIAGNOSIS — R5383 Other fatigue: Secondary | ICD-10-CM | POA: Diagnosis not present

## 2023-10-31 DIAGNOSIS — Z87891 Personal history of nicotine dependence: Secondary | ICD-10-CM | POA: Diagnosis not present

## 2023-10-31 DIAGNOSIS — E559 Vitamin D deficiency, unspecified: Secondary | ICD-10-CM | POA: Diagnosis not present

## 2023-10-31 DIAGNOSIS — D508 Other iron deficiency anemias: Secondary | ICD-10-CM | POA: Diagnosis not present

## 2023-10-31 DIAGNOSIS — D649 Anemia, unspecified: Secondary | ICD-10-CM | POA: Insufficient documentation

## 2023-10-31 DIAGNOSIS — R0602 Shortness of breath: Secondary | ICD-10-CM | POA: Diagnosis not present

## 2023-10-31 NOTE — Progress Notes (Signed)
 [facility] CONSULT NOTE  Patient Care Team: Practice, [facility] as PCP - General Rennie Cindy SAUNDERS, MD as Consulting Physician (Oncology) Rennie Cindy SAUNDERS, MD as Consulting Physician (Hematology and Oncology)  CHIEF COMPLAINTS/PURPOSE OF CONSULTATION: ANEMIA  HEMATOLOGY HISTORY  # ANEMIA- [Hb; MCV-platelets- WBC; Iron  sat; ferritin; GFR- CT/US   HISTORY OF PRESENTING ILLNESS: Patient ambulating-independently. Alone   Brittney Gill 28 y.o.  female pleasant patient is  been referred to us  for further evaluation of anemia.  Patient complains of shortness of breath with exertion.  Also complains of excessive fatigue.  Patient had extensive workup including a CT scan of the lungs to rule out PE.  After patient she also had cardiac workup including EKG.  Blood in stools: none EGD/colonoscopy-none Blood in urine:none Difficulty swallowing:none Prior blood transfusion:none Kidney/Liver disease:none Alcohol: rare Bariatric surgery: yes- OCT, 2023 richardo; Dr. Signe- [facility]]  Vaginal bleeding: normal.  Prior evaluation with hematology: none Prior bone marrow biopsy: none Prior IV iron  infusions:none  Interval History: Brittney Gill is a 28 y.o. female who returns to clinic for follow up. She feels well. Denies any neurologic complaints. Denies recent fevers or illnesses. Denies any easy bleeding or bruising. No melena or hematochezia. No pica or restless leg. Reports good appetite and denies weight loss. Denies chest pain. Denies any nausea, vomiting, constipation, or diarrhea. Denies urinary complaints. Patient offers no further specific complaints today. Has ongoing shortness of breath, occurs intermittently at rest.    Review of Systems  Constitutional:  Positive for malaise/fatigue. Negative for chills.  HENT:  Negative for nosebleeds.   Eyes:  Negative for double vision.  Respiratory:  Positive for shortness of breath. Negative for  cough, hemoptysis, sputum production and wheezing.   Cardiovascular:  Negative for chest pain, palpitations, orthopnea and leg swelling.  Gastrointestinal:  Positive for heartburn. Negative for blood in stool, constipation, diarrhea and melena.  Genitourinary:  Negative for dysuria, frequency and urgency.  Musculoskeletal:  Negative for back pain, falls and joint pain.  Skin:  Negative for itching and rash.  Neurological:  Negative for dizziness, tingling, focal weakness, weakness and headaches.  Endo/Heme/Allergies:  Does not bruise/bleed easily.  Psychiatric/Behavioral:  Negative for depression. The patient is nervous/anxious. The patient does not have insomnia.    MEDICAL HISTORY:  Past Medical History:  Diagnosis Date   Anxiety    Depression    GERD (gastroesophageal reflux disease)    Pregnancy occurring while using intrauterine contraceptive device (IUD) 02/02/2018   Sleep apnea    cpap   SURGICAL HISTORY: Past Surgical History:  Procedure Laterality Date   FRACTURE SURGERY     Left   HIATAL HERNIA REPAIR  01/08/2022   Procedure: LAPAROSCOPIC REPAIR OF HIATAL HERNIA;  Surgeon: Signe Mitzie LABOR, MD;  Location: [facility];  Service: General;;   LAPAROSCOPIC BILATERAL SALPINGECTOMY Bilateral 08/15/2020   Procedure: LAPAROSCOPIC BILATERAL SALPINGECTOMY;  Surgeon: Leonce Garnette BIRCH, MD;  Location: [facility];  Service: Gynecology;  Laterality: Bilateral;   LAPAROSCOPIC GASTRIC SLEEVE RESECTION N/A 01/08/2022   Procedure: LAPAROSCOPIC SLEEVE GASTRECTOMY;  Surgeon: Signe Mitzie LABOR, MD;  Location: [facility];  Service: General;  Laterality: N/A;   UPPER GI ENDOSCOPY N/A 01/08/2022   Procedure: UPPER GI ENDOSCOPY;  Surgeon: Signe Mitzie LABOR, MD;  Location: [facility];  Service: General;  Laterality: N/A;   WISDOM TOOTH EXTRACTION     SOCIAL HISTORY: Social History   Socioeconomic History   Marital status: Married    Spouse  name: Not on file   Number of children: 2   Years of education:  Not on file   Highest education level: High school graduate  Occupational History   Not on file  Tobacco Use   Smoking status: Former    Current packs/day: 0.00    Average packs/day: 0.5 packs/day for 1.7 years (0.9 ttl pk-yrs)    Types: Cigarettes    Start date: 04/01/2016    Quit date: 12/23/2017    Years since quitting: 5.8   Smokeless tobacco: Never   Tobacco comments:    Had quit in 2017, started back in 2018 but quit when she found out she was pregnant.  Vaping Use   Vaping status: Former  Substance and Sexual Activity   Alcohol use: No    Comment: social-not often   Drug use: No   Sexual activity: Yes    Birth control/protection: Surgical    Comment: vasectomy  Other Topics Concern   Not on file  Social History Narrative   Not on file   Social Drivers of Health   Financial Resource Strain: Not on file  Food Insecurity: No Food Insecurity (08/01/2023)   Hunger Vital Sign    Worried About Running Out of Food in the Last Year: Never true    Ran Out of Food in the Last Year: Never true  Transportation Needs: No Transportation Needs (08/01/2023)   PRAPARE - Administrator, Civil Service (Medical): No    Lack of Transportation (Non-Medical): No  Physical Activity: Not on file  Stress: Not on file  Social Connections: Not on file  Intimate Partner Violence: Not At Risk (08/01/2023)   Humiliation, Afraid, Rape, and Kick questionnaire    Fear of Current or Ex-Partner: No    Emotionally Abused: No    Physically Abused: No    Sexually Abused: No   FAMILY HISTORY: Family History  Problem Relation Age of Onset   Breast cancer Neg Hx    Cancer Neg Hx    Ovarian cancer Neg Hx    ALLERGIES:  is allergic to other and diflucan [fluconazole].  MEDICATIONS:  Current Outpatient Medications  Medication Sig Dispense Refill   buPROPion  (WELLBUTRIN  XL) 300 MG 24 hr tablet Take 300 mg by mouth daily.     Iron  Combinations (IRON  COMPLEX PO) Take by mouth in the morning  and at bedtime.     NON FORMULARY Pt uses a cpap nightly     omeprazole (PRILOSEC) 20 MG capsule Take 20 mg by mouth daily.     ondansetron  (ZOFRAN -ODT) 4 MG disintegrating tablet Dissolve 1 tablet (4 mg total) by mouth every 6 (six) hours as needed for nausea or vomiting. 20 tablet 0   WEGOVY 0.5 MG/0.5ML SOAJ SQ injection INJECT 0.5 MG SUBCUTANEOUSLY ONCE EACH WEEK FOR 4 WEEKS, THEN INCREASE TO 1 MG DOSE WEEK 5     pantoprazole  (PROTONIX ) 40 MG tablet Take 1 tablet (40 mg total) by mouth daily. Take this medication daily, regardless of reflux symptoms, for the first 90 days after surgery. (Patient not taking: Reported on 10/31/2023) 90 tablet 0   No current facility-administered medications for this visit.    PHYSICAL EXAMINATION: Vitals:   10/31/23 1353  BP: 112/72  Pulse: 78  Resp: 18  Temp: 97.8 F (36.6 C)  SpO2: 100%   Filed Weights   10/31/23 1353  Weight: 195 lb (88.5 kg)   Physical Exam Vitals and nursing note reviewed.  HENT:  Head: Normocephalic and atraumatic.     Mouth/Throat:     Pharynx: Oropharynx is clear.  Eyes:     Extraocular Movements: Extraocular movements intact.     Pupils: Pupils are equal, round, and reactive to light.  Cardiovascular:     Rate and Rhythm: Normal rate and regular rhythm.  Pulmonary:     Comments: Decreased breath sounds bilaterally.  Abdominal:     Palpations: Abdomen is soft.  Musculoskeletal:        General: Normal range of motion.     Cervical back: Normal range of motion.  Skin:    General: Skin is warm.  Neurological:     General: No focal deficit present.     Mental Status: She is alert and oriented to person, place, and time.  Psychiatric:        Behavior: Behavior normal.        Judgment: Judgment normal.     LABORATORY DATA:  I have reviewed the data as listed      No results found.   ASSESSMENT & PLAN:   # Anemia- Hb-11 symptomatic.  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 Venofer  x 4, last 09/19/23. Tolerated well. Etiology thought to be secondary to malabsorption/gastric sleeve 2023). Ferritin 130, iron  sat well replenished. Hmg normal. Hold iv iron . Plan to repeat labs in 3 mo.    # B12- 372- recommend B12 SL OTC  # Vitamin D  Deficiency- Vit D 24.2. Start vitamin d  supplement. Goal > 50.    DISPOSITION: 3 mo- labs ([facility]) Week later- see Dr Rennie, +/- venofer - la  No problem-specific Assessment & Plan notes found for this encounter.  All questions were answered. The patient knows to call the clinic with any problems, questions or concerns.  Tinnie KANDICE Dawn, NP 10/31/2023

## 2023-11-11 ENCOUNTER — Ambulatory Visit (INDEPENDENT_AMBULATORY_CARE_PROVIDER_SITE_OTHER): Admitting: Internal Medicine

## 2023-11-11 ENCOUNTER — Encounter: Payer: Self-pay | Admitting: Internal Medicine

## 2023-11-11 VITALS — BP 120/78 | HR 80 | Temp 98.7°F | Ht 65.0 in | Wt 193.0 lb

## 2023-11-11 DIAGNOSIS — G4733 Obstructive sleep apnea (adult) (pediatric): Secondary | ICD-10-CM | POA: Diagnosis not present

## 2023-11-11 DIAGNOSIS — R0602 Shortness of breath: Secondary | ICD-10-CM

## 2023-11-11 DIAGNOSIS — R079 Chest pain, unspecified: Secondary | ICD-10-CM | POA: Diagnosis not present

## 2023-11-11 LAB — NITRIC OXIDE: Nitric Oxide: 7

## 2023-11-11 NOTE — Progress Notes (Signed)
 Name: Brittney Gill MRN: [id number] DOB: 03/09/1996    CHIEF COMPLAINT:  ASSESSMENT OF SLEEP APNEA ASSESSMENT OF SOB   HISTORY OF PRESENT ILLNESS: Patient is seen today for problems and issues with sleep related to excessive daytime sleepiness Patient  has been having sleep problems for many years Patient has been having excessive daytime sleepiness for a long time Patient has been having extreme fatigue and tiredness, lack of energy +  very Loud snoring every night + struggling breathe at night and gasps for air + morning headaches + Nonrefreshing sleep  Discussed sleep data and reviewed with patient.  Encouraged proper weight management.  Discussed driving precautions and its relationship with hypersomnolence.  Discussed operating dangerous equipment and its relationship with hypersomnolence.  Discussed sleep hygiene, and benefits of a fixed sleep waked time.  The importance of getting eight or more hours of sleep discussed with patient.  Discussed limiting the use of the computer and television before bedtime.  Decrease naps during the day, so night time sleep will become enhanced.  Limit caffeine, and sleep deprivation.  HTN, stroke, and heart failure are potential risk factors.      11/11/2023    8:00 AM  Results of the Epworth flowsheet  Sitting and reading 2  Watching TV 1  Sitting, inactive in a public place (e.g. a theatre or a meeting) 0  As a passenger in a car for an hour without a break 1  Lying down to rest in the afternoon when circumstances permit 1  Sitting and talking to someone 0  Sitting quietly after a lunch without alcohol 1  In a car, while stopped for a few minutes in traffic 0  Total score 6     Assessment of shortness of breath Patient starting having shortness of breath since April Patient is a 1 pack-a-day smoker for 10 years quit in April Patient started vaping for 3 weeks before she started noticing some shortness of breath Mostly  occurring at rest Patient also having intermittent chest pain-recommend cardiology referral No exacerbation at this time No evidence of heart failure at this time No evidence or signs of infection at this time No respiratory distress No fevers, chills, nausea, vomiting, diarrhea No evidence of lower extremity edema No evidence hemoptysis  Patient was seen in the ER in April rule out PE CT of the chest reviewed in detail No significant abnormalities   Assessment of ASTHMA FeNO 7   ppb-Elevated exhaled Nitric oxide  testing is NOT consistent with type II inflammation No indication for inhalers at this time   Ambulating pulse oximetry in the office today did not reveal any significant hypoxia  PAST MEDICAL HISTORY :   has a past medical history of Anxiety, Depression, GERD (gastroesophageal reflux disease), Pregnancy occurring while using intrauterine contraceptive device (IUD) (02/02/2018), and Sleep apnea.  has a past surgical history that includes Wisdom tooth extraction; Fracture surgery; Laparoscopic bilateral salpingectomy (Bilateral, 08/15/2020); Laparoscopic gastric sleeve resection (N/A, 01/08/2022); Upper gi endoscopy (N/A, 01/08/2022); and Hiatal hernia repair (01/08/2022). Prior to Admission medications   Medication Sig Start Date End Date Taking? Authorizing Provider  buPROPion  (WELLBUTRIN  XL) 300 MG 24 hr tablet Take 300 mg by mouth daily. 07/16/21   [provider]  Iron  Combinations (IRON  COMPLEX PO) Take by mouth in the morning and at bedtime.    [provider]  NON FORMULARY Pt uses a cpap nightly    [provider]  omeprazole (PRILOSEC) 20 MG capsule Take 20  mg by mouth daily.    [provider]  ondansetron  (ZOFRAN -ODT) 4 MG disintegrating tablet Dissolve 1 tablet (4 mg total) by mouth every 6 (six) hours as needed for nausea or vomiting. 01/09/22   Signe Mitzie LABOR, MD  pantoprazole  (PROTONIX ) 40 MG tablet Take 1 tablet (40 mg  total) by mouth daily. Take this medication daily, regardless of reflux symptoms, for the first 90 days after surgery. Patient not taking: Reported on 10/31/2023 01/09/22   Signe Mitzie LABOR, MD  WEGOVY 0.5 MG/0.5ML SOAJ SQ injection INJECT 0.5 MG SUBCUTANEOUSLY ONCE EACH WEEK FOR 4 WEEKS, THEN INCREASE TO 1 MG DOSE WEEK 5 10/10/23   [provider]   Allergies  Allergen Reactions   Other Itching    Vitamin E cream   Diflucan [Fluconazole] Rash    FAMILY HISTORY:  family history is not on file. SOCIAL HISTORY:  reports that she quit smoking about 5 years ago. Her smoking use included cigarettes. She started smoking about 7 years ago. She has a 0.9 pack-year smoking history. She has never used smokeless tobacco. She reports that she does not drink alcohol and does not use drugs.  BP 120/78 (BP Location: Right Arm, Patient Position: Sitting, Cuff Size: Normal)   Pulse 80   Temp 98.7 F (37.1 C) (Oral)   Ht 5' 5 (1.651 m)   Wt 193 lb (87.5 kg)   LMP  (LMP Unknown)   SpO2 99%   BMI 32.12 kg/m     Review of Systems: Gen:  Denies  fever, sweats, chills weight loss  HEENT: Denies blurred vision, double vision, ear pain, eye pain, hearing loss, nose bleeds, sore throat Cardiac:  No dizziness, chest pain or heaviness, chest tightness,edema, No JVD Resp:   No cough, -sputum production, +shortness of breath,-wheezing, -hemoptysis,  Other:  All other systems negative   Physical Examination:   General Appearance: No distress  EYES PERRLA, EOM intact.   NECK Supple, No JVD Pulmonary: normal breath sounds, No wheezing.  CardiovascularNormal S1,S2.  No m/r/g.   Abdomen: Benign, Soft, non-tender. Neurology UE/LE 5/5 strength, no focal deficits Ext pulses intact, cap refill intact ALL OTHER ROS ARE NEGATIVE     ASSESSMENT AND PLAN SYNOPSIS  Patient with signs and symptoms of excessive daytime sleepiness with probable underlying diagnosis of obstructive sleep apnea in the  setting of obesity and deconditioned state, patient also with shortness of breath likely related to vaping and smoking history with probable underlying intermittent reactive airways disease   Recommend home sleep Study for definitve diagnosis  Assessment shortness of breath   Obesity -recommend significant weight loss -recommend changing diet  Deconditioned state -Recommend increased daily activity and exercise   MEDICATION ADJUSTMENTS/LABS AND TESTS ORDERED:  Recommend weight loss Recommend home sleep study Recommend pulmonary function testing Please stop smoking and vaping Avoid Allergens and Irritants Avoid secondhand smoke Avoid SICK contacts Recommend  Masking  when appropriate Recommend Keep up-to-date with vaccinations Cardiology referral for intermittent chest pain   CURRENT MEDICATIONS REVIEWED AT LENGTH WITH PATIENT TODAY   Patient  satisfied with Plan of action and management. All questions answered   Follow up 3 months   I spent a total of 65 minutes dedicated to the care of this patient on the date of this encounter to include pre-visit review of records, face-to-face time with the patient discussing conditions above, post visit ordering of testing, clinical documentation with the electronic health record, making appropriate referrals as documented, and communicating necessary information  to the patient's healthcare team.     Nickolas Alm Cellar, M.D.  [facility]  Medical Director [facility] Medical Director [facility] Cardio-Pulmonary Department

## 2023-11-11 NOTE — Patient Instructions (Signed)
 Recommend obtaining pulmonary function testing to assess lung function  Please keep away from smoking and vaping  Referral to cardiology to assess for intermittent chest pain  Recommend weight loss  Recommend home sleep test to assess for sleep apnea  Avoid Allergens and Irritants Avoid secondhand smoke Avoid SICK contacts Recommend  Masking  when appropriate Recommend Keep up-to-date with vaccinations

## 2023-11-28 ENCOUNTER — Ambulatory Visit: Admitting: Internal Medicine

## 2023-11-28 DIAGNOSIS — G4733 Obstructive sleep apnea (adult) (pediatric): Secondary | ICD-10-CM | POA: Diagnosis not present

## 2023-11-28 LAB — PULMONARY FUNCTION TEST
DL/VA % pred: 132 %
DL/VA: 6.06 ml/min/mmHg/L
DLCO unc % pred: 146 %
DLCO unc: 34.15 ml/min/mmHg
FEF 25-75 Post: 4.05 L/s
FEF 25-75 Pre: 3.59 L/s
FEF2575-%Change-Post: 12 %
FEF2575-%Pred-Post: 114 %
FEF2575-%Pred-Pre: 101 %
FEV1-%Change-Post: 4 %
FEV1-%Pred-Post: 119 %
FEV1-%Pred-Pre: 114 %
FEV1-Post: 3.96 L
FEV1-Pre: 3.78 L
FEV1FVC-%Change-Post: 5 %
FEV1FVC-%Pred-Pre: 95 %
FEV6-%Change-Post: -1 %
FEV6-%Pred-Post: 118 %
FEV6-%Pred-Pre: 120 %
FEV6-Post: 4.6 L
FEV6-Pre: 4.65 L
FEV6FVC-%Pred-Post: 100 %
FEV6FVC-%Pred-Pre: 100 %
FVC-%Change-Post: -1 %
FVC-%Pred-Post: 118 %
FVC-%Pred-Pre: 119 %
FVC-Post: 4.61 L
FVC-Pre: 4.65 L
Post FEV1/FVC ratio: 86 %
Post FEV6/FVC ratio: 100 %
Pre FEV1/FVC ratio: 81 %
Pre FEV6/FVC Ratio: 100 %
RV % pred: 138 %
RV: 1.95 L
TLC % pred: 125 %
TLC: 6.55 L

## 2023-11-28 NOTE — Progress Notes (Signed)
 Full PFT completed today ? ?

## 2023-11-28 NOTE — Patient Instructions (Signed)
 Full PFT completed today ? ?

## 2023-12-04 ENCOUNTER — Ambulatory Visit: Payer: Self-pay

## 2023-12-24 ENCOUNTER — Ambulatory Visit: Attending: Internal Medicine | Admitting: Internal Medicine

## 2023-12-24 ENCOUNTER — Encounter: Payer: Self-pay | Admitting: Internal Medicine

## 2023-12-24 VITALS — BP 104/64 | HR 88 | Ht 65.0 in | Wt 189.0 lb

## 2023-12-24 DIAGNOSIS — R079 Chest pain, unspecified: Secondary | ICD-10-CM

## 2023-12-24 DIAGNOSIS — R0602 Shortness of breath: Secondary | ICD-10-CM

## 2023-12-24 NOTE — Progress Notes (Unsigned)
 Cardiology Office Note:  .   Date:  12/25/2023  ID:  Brittney Gill, DOB [birth year], MRN [id number] PCP: [facility], [facility] Providers Cardiologist:  None     History of Present Illness: Brittney Gill is a 28 y.o. female with history of obstructive sleep apnea, GERD, depression, and anxiety, who has been referred for evaluation of intermittent chest pain by Dr. Isaiah.  Today, Brittney Gill reports that she has been struggling with shortness of breath and intermittent chest pain for about a year.  Symptoms happen randomly, with the pain lasting anywhere from several minutes to all day.  The chest pain happens about once a week but can vary in frequency.  It typically does not happen with exertion, though she has had a couple of episodes of chest pain when she is active.  The discomfort is primarily in the right upper chest without radiation.  She denies associated symptoms other than the aforementioned dyspnea.  Brittney Gill also reports intermittent orthostatic lightheadedness no syncope.  Became more pronounced after her sleeve gastrectomy and hiatal hernia repair couple years ago.  She has a history of sleep apnea but is not currently on CPAP, awaiting a repeat sleep study.  She notes that she no longer snores.  She has not had any lower extremity edema.  She denies a history of heart disease and prior cardiac testing.  ROS: See HPI  Studies Reviewed: Brittney   EKG Interpretation Date/Time:  Wednesday December 24 2023 15:39:33 EDT Ventricular Rate:  88 PR Interval:  148 QRS Duration:  92 QT Interval:  380 QTC Calculation: 459 R Axis:   19  Text Interpretation: Normal sinus rhythm Nonspecific T wave abnormality Abnormal ECG When compared with ECG of 17-Jul-2023 22:02, Questionable change in QRS axis Nonspecific T wave abnormality is now Present Confirmed by Cerria Randhawa, Lonni [phone]) on 12/25/2023 2:54:07 PM    Risk Assessment/Calculations:              Physical Exam:   VS:  BP 104/64 (BP Location: Right Arm, Patient Position: Sitting, Cuff Size: Normal)   Pulse 88   Ht 5' 5 (1.651 m)   Wt 189 lb (85.7 kg)   SpO2 99%   BMI 31.45 kg/m    Wt Readings from Last 3 Encounters:  12/24/23 189 lb (85.7 kg)  11/28/23 190 lb (86.2 kg)  11/11/23 193 lb (87.5 kg)    General:  NAD. Neck: No JVD or HJR. Lungs: Clear to auscultation bilaterally without wheezes or crackles. Heart: Regular rate and rhythm without murmurs, rubs, or gallops. Abdomen: Soft, nontender, nondistended. Extremities: No lower extremity edema.  ASSESSMENT AND PLAN: .    Shortness of breath and chest pain: Symptoms have been intermittently present for about a year and are atypical in nature, seemingly random without a clear exertional component.  Physical exam today is unrevealing.  EKG shows supple nonspecific ST abnormalities but overall is unremarkable.  We have discussed further evaluation options and the low risk for ischemic heart disease given her young age and atypical symptoms.  Will obtain an echocardiogram.  Defer medication changes at this time.  Empiric use of NSAIDs for possible musculoskeletal etiology was discussed, though Brittney Gill states that she was told not to take NSAIDs after her bariatric surgery.  She has tried acetaminophen  without significant improvement.  If symptoms do not improve, may need to consider GXT to exclude ischemic heart disease.    Dispo: Return to  clinic after echocardiogram  Signed, Lonni Hanson, MD

## 2023-12-24 NOTE — Patient Instructions (Signed)
 Medication Instructions:  Your physician recommends that you continue on your current medications as directed. Please refer to the Current Medication list given to you today.    *If you need a refill on your cardiac medications before your next appointment, please call your pharmacy*  Lab Work: No labs ordered today    Testing/Procedures: Your physician has requested that you have an echocardiogram. Echocardiography is a painless test that uses sound waves to create images of your heart. It provides your doctor with information about the size and shape of your heart and how well your heart's chambers and valves are working.   You may receive an ultrasound enhancing agent through an IV if needed to better visualize your heart during the echo. This procedure takes approximately one hour.  There are no restrictions for this procedure.  This will take place at [address] [facility], [place] [address]  Please note: We ask at that you not bring children with you during ultrasound (echo/ vascular) testing. Due to room size and safety concerns, children are not allowed in the ultrasound rooms during exams. Our front office staff cannot provide observation of children in our lobby area while testing is being conducted. An adult accompanying a patient to their appointment will only be allowed in the ultrasound room at the discretion of the ultrasound technician under special circumstances. We apologize for any inconvenience.   Follow-Up: At [facility], you and your health needs are our priority.  As part of our continuing mission to provide you with exceptional heart care, our providers are all part of one team.  This team includes your primary Cardiologist (physician) and Advanced Practice Providers or APPs (Physician Assistants and Nurse Practitioners) who all work together to provide you with the care you need, when you need it.  Your next appointment:   2  month(s)  Provider:   You may see Lonni Hanson, MD or one of the following Advanced Practice Providers on your designated Care Team:   Lonni Meager, NP Lesley Maffucci, PA-C Bernardino Bring, PA-C Cadence [place], PA-C Tylene Lunch, NP Barnie Hila, NP

## 2023-12-25 ENCOUNTER — Encounter: Payer: Self-pay | Admitting: Internal Medicine

## 2023-12-25 DIAGNOSIS — R079 Chest pain, unspecified: Secondary | ICD-10-CM | POA: Insufficient documentation

## 2023-12-25 DIAGNOSIS — R0602 Shortness of breath: Secondary | ICD-10-CM | POA: Insufficient documentation

## 2024-02-11 ENCOUNTER — Inpatient Hospital Stay: Admitting: Internal Medicine

## 2024-02-11 ENCOUNTER — Inpatient Hospital Stay

## 2024-02-12 ENCOUNTER — Ambulatory Visit: Attending: Internal Medicine

## 2024-02-18 ENCOUNTER — Encounter: Payer: Self-pay | Admitting: Internal Medicine

## 2024-02-23 ENCOUNTER — Encounter: Payer: Self-pay | Admitting: Internal Medicine

## 2024-02-24 ENCOUNTER — Ambulatory Visit: Attending: Medical | Admitting: Medical

## 2024-02-24 NOTE — Progress Notes (Deleted)
  Cardiology Office Note   Date:  02/24/2024  ID:  Lettie, Czarnecki 04/26/1995, MRN [id number] PCP: [facility], [facility] Providers Cardiologist:  None { Click to update primary MD,subspecialty MD or APP then REFRESH:1}    History of Present Illness Brittney Gill is a 28 y.o. female with a h/o OSA, GERD, depression, and anxiety who is being seen for follow-up.  Patient was seen 12/24/2023 for intermittent chest pain and shortness of breath.  Echocardiogram was ordered.  Today,   ROS: ***  Studies Reviewed      *** Risk Assessment/Calculations {Does this patient have ATRIAL FIBRILLATION?[phone]} No BP recorded.  {Refresh Note OR Click here to enter BP  :1}***       Physical Exam VS:  There were no vitals taken for this visit.       Wt Readings from Last 3 Encounters:  12/24/23 189 lb (85.7 kg)  11/28/23 190 lb (86.2 kg)  11/11/23 193 lb (87.5 kg)    GEN: Well nourished, well developed in no acute distress NECK: No JVD; No carotid bruits CARDIAC: ***RRR, no murmurs, rubs, gallops RESPIRATORY:  Clear to auscultation without rales, wheezing or rhonchi  ABDOMEN: Soft, non-tender, non-distended EXTREMITIES:  No edema; No deformity   ASSESSMENT AND PLAN ***    {Are you ordering a CV Procedure (e.g. stress test, cath, DCCV, TEE, etc)?   Press F2        :[id number]}  Dispo: ***  Signed, Rori Goar VEAR Fishman, PA-C

## 2024-03-01 ENCOUNTER — Inpatient Hospital Stay: Attending: Nurse Practitioner | Admitting: Nurse Practitioner

## 2024-03-01 ENCOUNTER — Encounter: Payer: Self-pay | Admitting: Nurse Practitioner

## 2024-03-01 ENCOUNTER — Inpatient Hospital Stay
